# Patient Record
Sex: Female | Born: 1965 | Race: White | Hispanic: No | Marital: Single | Smoking: Never smoker
Health system: Southern US, Community
[De-identification: ages and names within clinical notes are randomized; demographics above are authoritative.]

## PROBLEM LIST (undated history)

## (undated) DIAGNOSIS — Z972 Presence of dental prosthetic device (complete) (partial): Secondary | ICD-10-CM

## (undated) DIAGNOSIS — R112 Nausea with vomiting, unspecified: Secondary | ICD-10-CM

## (undated) DIAGNOSIS — I447 Left bundle-branch block, unspecified: Secondary | ICD-10-CM

## (undated) DIAGNOSIS — K222 Esophageal obstruction: Secondary | ICD-10-CM

## (undated) DIAGNOSIS — Z973 Presence of spectacles and contact lenses: Secondary | ICD-10-CM

## (undated) DIAGNOSIS — Z8719 Personal history of other diseases of the digestive system: Secondary | ICD-10-CM

## (undated) DIAGNOSIS — K219 Gastro-esophageal reflux disease without esophagitis: Secondary | ICD-10-CM

## (undated) DIAGNOSIS — Z974 Presence of external hearing-aid: Secondary | ICD-10-CM

## (undated) DIAGNOSIS — I10 Essential (primary) hypertension: Secondary | ICD-10-CM

## (undated) DIAGNOSIS — T7840XA Allergy, unspecified, initial encounter: Secondary | ICD-10-CM

## (undated) DIAGNOSIS — Z9889 Other specified postprocedural states: Secondary | ICD-10-CM

## (undated) DIAGNOSIS — G4700 Insomnia, unspecified: Secondary | ICD-10-CM

## (undated) DIAGNOSIS — G500 Trigeminal neuralgia: Secondary | ICD-10-CM

## (undated) HISTORY — DX: Allergy, unspecified, initial encounter: T78.40XA

## (undated) HISTORY — DX: Trigeminal neuralgia: G50.0

## (undated) HISTORY — PX: CARDIAC SURGERY: SHX584

## (undated) HISTORY — DX: Insomnia, unspecified: G47.00

## (undated) HISTORY — DX: Esophageal obstruction: K22.2

## (undated) HISTORY — DX: Essential (primary) hypertension: I10

---

## 1998-07-19 HISTORY — PX: ABDOMINAL HYSTERECTOMY: SHX81

## 2004-07-22 ENCOUNTER — Other Ambulatory Visit: Admission: RE | Admit: 2004-07-22 | Discharge: 2004-07-22 | Payer: Self-pay | Admitting: Obstetrics and Gynecology

## 2004-08-20 ENCOUNTER — Observation Stay (HOSPITAL_COMMUNITY): Admission: RE | Admit: 2004-08-20 | Discharge: 2004-08-21 | Payer: Self-pay | Admitting: Obstetrics and Gynecology

## 2004-08-20 ENCOUNTER — Encounter (INDEPENDENT_AMBULATORY_CARE_PROVIDER_SITE_OTHER): Payer: Self-pay | Admitting: Specialist

## 2005-01-12 ENCOUNTER — Emergency Department: Payer: Self-pay | Admitting: Emergency Medicine

## 2005-12-25 ENCOUNTER — Emergency Department: Payer: Self-pay | Admitting: Emergency Medicine

## 2006-01-20 ENCOUNTER — Observation Stay: Payer: Self-pay | Admitting: General Surgery

## 2006-07-19 HISTORY — PX: CHOLECYSTECTOMY: SHX55

## 2006-09-15 ENCOUNTER — Emergency Department: Payer: Self-pay | Admitting: Emergency Medicine

## 2007-02-23 ENCOUNTER — Emergency Department: Payer: Self-pay | Admitting: Unknown Physician Specialty

## 2007-02-23 ENCOUNTER — Other Ambulatory Visit: Payer: Self-pay

## 2007-11-20 ENCOUNTER — Emergency Department (HOSPITAL_COMMUNITY): Admission: EM | Admit: 2007-11-20 | Discharge: 2007-11-20 | Payer: Self-pay | Admitting: Emergency Medicine

## 2008-05-01 ENCOUNTER — Ambulatory Visit: Payer: Self-pay | Admitting: Gastroenterology

## 2008-05-16 ENCOUNTER — Emergency Department: Payer: Self-pay | Admitting: Unknown Physician Specialty

## 2008-10-04 ENCOUNTER — Emergency Department: Payer: Self-pay | Admitting: Emergency Medicine

## 2008-12-11 ENCOUNTER — Ambulatory Visit: Payer: Self-pay | Admitting: Gastroenterology

## 2008-12-13 ENCOUNTER — Ambulatory Visit: Payer: Self-pay | Admitting: Family

## 2009-02-13 ENCOUNTER — Observation Stay: Payer: Self-pay | Admitting: Family

## 2009-06-13 ENCOUNTER — Emergency Department: Payer: Self-pay | Admitting: Emergency Medicine

## 2009-08-03 ENCOUNTER — Ambulatory Visit: Payer: Self-pay | Admitting: Specialist

## 2009-08-11 ENCOUNTER — Emergency Department (HOSPITAL_COMMUNITY): Admission: EM | Admit: 2009-08-11 | Discharge: 2009-08-11 | Payer: Self-pay | Admitting: Emergency Medicine

## 2010-12-04 NOTE — Discharge Summary (Signed)
Mary Bryant, Mary Bryant           ACCOUNT NO.:  [id number]   MEDICAL RECORD NO.:  [id number]          PATIENT TYPE:  INP   LOCATION:  9313                          FACILITY:  [facility]   PHYSICIAN:  Zenaida Niece, M.D.DATE OF BIRTH:  September 24, 1965   DATE OF ADMISSION:  08/20/2004  DATE OF DISCHARGE:                                 DISCHARGE SUMMARY   ADMISSION DIAGNOSIS:  Menorrhagia with anemia.   DISCHARGE DIAGNOSIS:  Menorrhagia with anemia.   PROCEDURES:  On August 20, 2004 she had a vaginal hysterectomy.   HISTORY AND PHYSICAL:  Please see chart for full history and physical.  Briefly, this is a 45 year old gravida 3 para 2-0-1-2 with heavy periods,  using 24 pads in 2 days.  All options were discussed with the patient and  she had a normal pelvic ultrasound.  The patient wished to proceed with  definitive surgical therapy.  Past history significant for two vaginal  deliveries and one spontaneous abortion.  Physical exam significant for  benign abdomen and on pelvic exam, the uterus was upper limits of normal  size, mid planar to retroverted, and nontender, and she had no adnexal  masses.   HOSPITAL COURSE:  The patient was admitted and had a vaginal hysterectomy  under general anesthesia without complications.  Estimated blood loss was  100 mL.  Postoperatively, she had no complications except for postoperative  nausea and vomiting which resolved with IV Zofran.  On the morning of  postoperative day #1 she felt well and was felt to be stable enough for  discharge home.  Preoperative hemoglobin 9.5, postoperative is 8.5.   DISCHARGE INSTRUCTIONS:  Regular diet, pelvic rest, no strenuous activity.  Follow-up is in approximately 6 weeks.  Medications are over-the-counter  ibuprofen or naproxen as needed.      TDM/MEDQ  D:  08/21/2004  T:  08/21/2004  Job:  [id number]

## 2010-12-04 NOTE — Op Note (Signed)
Mary Bryant, Mary Bryant           ACCOUNT NO.:  [id number]   MEDICAL RECORD NO.:  [id number]          PATIENT TYPE:  INP   LOCATION:  NA                            [facility]  [facility]   PHYSICIAN:  Zenaida Niece, M.D.DATE OF BIRTH:  [birth year]   DATE OF PROCEDURE:  08/20/2004  DATE OF DISCHARGE:                                 OPERATIVE REPORT   PREOPERATIVE DIAGNOSIS:  Menorrhagia with anemia.   POSTOPERATIVE DIAGNOSIS:  Menorrhagia with anemia.   PROCEDURE:  Transvaginal hysterectomy and cystoscopy.   SURGEON:  Zenaida Niece, M.D.   ASSISTANT:  Huel Cote, M.D.   ANESTHESIA:  General with an LMA.   SPECIMENS:  Uterus.   ESTIMATED BLOOD LOSS:  100 cc.   FINDINGS:  Small uterus with normal tubes and ovaries and a normal bladder  and ureters by cystoscopy.   DESCRIPTION OF PROCEDURE:  The patient was taken to the operating room and  placed in the dorsal supine position.  General anesthesia was induced, and  she was placed in mobile stirrups.  The perineum and vagina were then  prepped and draped in the usual sterile fashion and the bladder drained with  a red rubber catheter.   A weighted speculum was inserted into the vagina, and the cervix was grasped  with Christella Hartigan tenaculums.  Pitressin solution was used under the  cervicovaginal mucosa.  This was then incised circumferentially with  electrocautery.  Dissection was carried out further sharply.  The bladder  was mobilized off of the anterior portion of the cervix, and the peritoneum  was identified and entered sharply.  A Deaver retractor was used to retract  the bladder anteriorly.  The posterior cul-de-sac was identified and entered  sharply, and a Bonano speculum was placed into the posterior cul-de-sac.  The uterosacral ligament were clamped, transected, and ligated on each side  and tied for later use.  The uterine arteries, cardinal ligaments, and lower  broad ligaments were clamped, transected, and  ligated on each side with #1  chromic.  The fundus was then delivered through the incision, and the utero-  ovarian pedicles were clamped on each side, transected, and doubly ligated  with #1 chromic and tagged for inspection.  Both tubes and ovaries were  found to be normal.  Bleeding from between pedicles on the right side was  controlled with two figure-of-eight sutures of #1 chromic.  The remainder of  the pedicles were hemostatic.  The uterosacral ligaments were then plicated  in the midline with 0 silk suture.  The previously tagged uterosacral  pedicles were also tied in the midline.  The vagina was then closed in a  vertical fashion with running locking 2-0 Vicryl, with adequate closure and  adequate hemostasis.   Attention was turned to cystoscopy.  The patient was given indigo carmine  IV.  The 70-degree cystoscope was inserted and the bladder filled with  approximately 200 cc.  The bladder was intact, and indigo carmine was seen  to come from each ureteral orifice.  The cystoscope was removed, and a Foley  catheter was placed.   The patient was  taken down from the stirrups.  She was taken to the recovery  room in stable condition after tolerating the procedure well.  Counts were  correct x2.  She received Ancef 1 g prior to the procedure.  She had PAS  hose on throughout the procedure.      TDM/MEDQ  D:  08/20/2004  T:  08/20/2004  Job:  [id number]

## 2010-12-04 NOTE — H&P (Signed)
NAMEJUDITHANN, Mary Bryant           ACCOUNT NO.:  [id number]   MEDICAL RECORD NO.:  [id number]          PATIENT TYPE:  INP   LOCATION:  NA                            [facility]   PHYSICIAN:  Zenaida Niece, M.D.DATE OF BIRTH:  19-Dec-1965   DATE OF ADMISSION:  08/20/2004  DATE OF DISCHARGE:                                HISTORY & PHYSICAL   CHIEF COMPLAINT:  Menorrhagia with anemia.   HISTORY OF PRESENT ILLNESS:  This is a 45 year old, gravida 3, para 2-0-1-2,  whom I saw for the first time on January 4 of this year.  At that time, she  came for an annual exam.  She complained of heavy periods where she can use  24 pads in 2 days.  Her menses are fairly regular, and she does have breast  soreness prior to her menses.  She does have occasional discomfort with  intercourse.   Physical examination was significant for a uterus that is upper limits of  normal size.  She had no masses.   Pelvic ultrasound revealed a normal uterus with a small left ovarian cyst.  The patient wishes to proceed with definitive surgical therapy and is  admitted for a hysterectomy at this time.   PAST OB HISTORY:  Vaginal deliveries at 32 and 36 weeks and one intrauterine  demise with spontaneous abortion at approximately 6 months.   PAST MEDICAL HISTORY:  Anemia.   PAST SURGICAL HISTORY:  D&C x 2 and a possible laparoscopy.   ALLERGIES:  PHENERGAN.   CURRENT MEDICATIONS:  Iron.   SOCIAL HISTORY:  The patient is married and denies significant alcohol,  tobacco, or drug use.   GYN HISTORY:  History of an abnormal Pap smear six years ago with normal  followup.  Her husband has a vasectomy which is what they use for birth  control.  She has no unusual lesions or discharge.   REVIEW OF SYSTEMS:  She does have some urinary frequency with normal bowel  movements.   FAMILY HISTORY:  No GYN or colon cancer.   PHYSICAL EXAMINATION:  VITAL SIGNS:  Weight is 153 pounds.  Blood pressure  130/90,  pulse 80.  GENERAL:  Well-developed female in no acute distress.  NECK:  Supple without lymphadenopathy or thyromegaly.  LUNGS:  Clear to auscultation.  HEART:  Regular rate and rhythm without murmur.  ABDOMEN:  Soft, nontender, nondistended, without palpable masses, and she  does have laparoscopic scars.  EXTREMITIES:  No edema and nontender.  PELVIC:  External genitalia normal.  On speculum exam, the cervix is normal.  Pap smear was performed and has returned normal.  On bimanual exam, uterus  is upper limits of normal size, mid planar to retroverted and nontender, and  there are no adnexal masses.   LABORATORY DATA:  Office hemoglobin was 8.7.   ASSESSMENT:  1.  Menorrhagia with anemia.  All surgical and nonsurgical options have been      discussed wit the patient, and she wishes to proceed with definitive      surgical therapy.  Risks of surgery including bleeding, infection, and  damage to surrounding organs have been discussed, and she understands.  2.  The patient complains of her hair falling out.   PLAN:  1.  Admit the patient for vaginal hysterectomy.  We will leave her ovaries      unless they appear abnormal.  2.  I will also check a TSH with her postop labs.      TDM/MEDQ  D:  08/19/2004  T:  08/19/2004  Job:  [id number]

## 2011-03-15 ENCOUNTER — Telehealth: Payer: Self-pay | Admitting: Internal Medicine

## 2011-03-15 NOTE — Telephone Encounter (Signed)
Left message for mom to return my call

## 2011-03-15 NOTE — Telephone Encounter (Signed)
Mom made appointment for cpx in Sept 27 for Mary Bryant.  Then mom was saying pt almost passed out last week her sugar was 60 pt lost 50 pounds since jan  Should patient come in early  When do you want her to come in send to [place] [facility]

## 2011-03-18 NOTE — Telephone Encounter (Signed)
Patient will be keeping Sept appt.

## 2011-04-15 ENCOUNTER — Encounter: Payer: Self-pay | Admitting: Internal Medicine

## 2011-04-19 ENCOUNTER — Telehealth: Payer: Self-pay | Admitting: Internal Medicine

## 2011-04-19 NOTE — Telephone Encounter (Signed)
Wants to get labs done at [facility]  [address]

## 2011-04-20 NOTE — Telephone Encounter (Signed)
I believe we discussed that patients who have not been seen here yet or had their records abstracted will not be able to get labs until they are seen.  She is one of those cases.  Please explain to her that I do not know what labs to order since I have no information about her available to review.

## 2011-04-20 NOTE — Telephone Encounter (Signed)
Patient is asking for an order for labs to have them done at [facility].

## 2011-04-22 NOTE — Telephone Encounter (Signed)
Left message and explained to patient that we are advising to wait until she is seen for labs because we do not have her chart abstracted and has not been seen in office yet.

## 2011-04-26 ENCOUNTER — Other Ambulatory Visit: Payer: Self-pay

## 2011-04-26 ENCOUNTER — Encounter: Payer: Self-pay | Admitting: Internal Medicine

## 2011-05-17 ENCOUNTER — Ambulatory Visit (INDEPENDENT_AMBULATORY_CARE_PROVIDER_SITE_OTHER): Payer: BC Managed Care – PPO | Admitting: Internal Medicine

## 2011-05-17 ENCOUNTER — Encounter: Payer: Self-pay | Admitting: Internal Medicine

## 2011-05-17 DIAGNOSIS — R634 Abnormal weight loss: Secondary | ICD-10-CM

## 2011-05-17 DIAGNOSIS — G4700 Insomnia, unspecified: Secondary | ICD-10-CM | POA: Insufficient documentation

## 2011-05-17 DIAGNOSIS — K222 Esophageal obstruction: Secondary | ICD-10-CM | POA: Insufficient documentation

## 2011-05-17 DIAGNOSIS — Z13220 Encounter for screening for lipoid disorders: Secondary | ICD-10-CM

## 2011-05-17 DIAGNOSIS — E162 Hypoglycemia, unspecified: Secondary | ICD-10-CM

## 2011-05-17 DIAGNOSIS — Z1239 Encounter for other screening for malignant neoplasm of breast: Secondary | ICD-10-CM

## 2011-05-17 DIAGNOSIS — I10 Essential (primary) hypertension: Secondary | ICD-10-CM | POA: Insufficient documentation

## 2011-05-17 MED ORDER — ZOLPIDEM TARTRATE 10 MG PO TABS: 10.0000 mg | ORAL_TABLET | Freq: Every evening | ORAL | Status: AC | PRN

## 2011-05-17 NOTE — Progress Notes (Signed)
Subjective:    Patient ID: Mary Bryant, female    DOB: [birth year], 45 y.o.   MRN: [id number]  HPI  45 yo white female with history of chronic insomnia presents with an unintentional wt loss of 60 lbs since January.  Has been checking blood sugars occasionally due to developmeent of episodic tremors, nu=ausea and sweats in the setting od skipping meals,  Eating junk food instgead of healthy meals, and frequent use of sugared drinks.   She reportedly had cbg of 14  one month ago, which she treated with fruit juice. Reports early satiety so she is more frequently .  Has not labs in the last year  .  Has a headache 5 or 6 days out of every week,  Takes ibuprofen daily. Sleeps only 2 hours per night, for the last 8 yrs.  Has IBS symptoms for the past  Yrs no prior colonoscopy,  Has diarrhea 30 minutes post meal couple times daily. No fecal incontinence.    Past Medical History  Diagnosis Date  . Hypertension     no prior treatment  . Insomnia     chronic, no prior sleep study  . Esophageal stricture     dilated 2009,  elliott  . rhinitis allergic     No current outpatient prescriptions on file prior to visit.    Review of Systems  Constitutional: Positive for unexpected weight change. Negative for fever and chills.  HENT: Negative for hearing loss, ear pain, nosebleeds, congestion, sore throat, facial swelling, rhinorrhea, sneezing, mouth sores, trouble swallowing, neck pain, neck stiffness, voice change, postnasal drip, sinus pressure, tinnitus and ear discharge.   Eyes: Negative for pain, discharge, redness and visual disturbance.  Respiratory: Negative for cough, chest tightness, shortness of breath, wheezing and stridor.   Cardiovascular: Negative for chest pain, palpitations and leg swelling.  Gastrointestinal: Positive for diarrhea.  Musculoskeletal: Negative for myalgias and arthralgias.  Skin: Negative for color change and rash.  Neurological: Negative for dizziness, weakness,  light-headedness and headaches.  Hematological: Negative for adenopathy.  Psychiatric/Behavioral: Positive for sleep disturbance. The patient is nervous/anxious.        Objective:   Physical Exam  Constitutional: She is oriented to person, place, and time. She appears well-developed and well-nourished.  HENT:  Mouth/Throat: Oropharynx is clear and moist.  Eyes: EOM are normal. Pupils are equal, round, and reactive to light. No scleral icterus.  Neck: Normal range of motion. Neck supple. No JVD present. No thyromegaly present.  Cardiovascular: Normal rate, regular rhythm, normal heart sounds and intact distal pulses.   Pulmonary/Chest: Effort normal and breath sounds normal.  Abdominal: Soft. Bowel sounds are normal. She exhibits no mass. There is no tenderness.  Musculoskeletal: Normal range of motion. She exhibits no edema.  Lymphadenopathy:    She has no cervical adenopathy.  Neurological: She is alert and oriented to person, place, and time.  Skin: Skin is warm and dry.  Psychiatric: She has a normal mood and affect.          Assessment & Plan:  Weight loss:  Will screen for diabetes and hyperthyroidism, followed by occult CA if all normal. Hypoglycemia:  If not overt DM ,  May be due to poor dietary habits.  Recommened protein drinks in AM instead of suagrd soft drinks.  Spent 20 minutes cousnelling on diet and lifestyle. Diarrhea:  May be IBS but need to rule out celliac sprue, microscoic colitis given weight loss.  Patient not intnerested in colonscopy at this  time. But will refer if all other workup is negative.

## 2011-05-19 ENCOUNTER — Other Ambulatory Visit (INDEPENDENT_AMBULATORY_CARE_PROVIDER_SITE_OTHER): Payer: BC Managed Care – PPO | Admitting: *Deleted

## 2011-05-19 DIAGNOSIS — R634 Abnormal weight loss: Secondary | ICD-10-CM

## 2011-05-19 DIAGNOSIS — Z13220 Encounter for screening for lipoid disorders: Secondary | ICD-10-CM

## 2011-05-19 DIAGNOSIS — E162 Hypoglycemia, unspecified: Secondary | ICD-10-CM

## 2011-05-19 LAB — COMPREHENSIVE METABOLIC PANEL
Albumin: 4.1 g/dL (ref 3.5–5.2)
BUN: 11 mg/dL (ref 6–23)
CO2: 27 mEq/L (ref 19–32)
GFR: 80.55 mL/min (ref >60.00)
Glucose, Bld: 89 mg/dL (ref 70–99)
Potassium: 4.4 mEq/L (ref 3.5–5.1)
Sodium: 140 mEq/L (ref 135–145)
Total Protein: 7.8 g/dL (ref 6.0–8.3)

## 2011-05-19 LAB — TSH: TSH: 0.73 u[IU]/mL (ref 0.35–5.50)

## 2011-05-19 LAB — HEMOGLOBIN A1C: Hgb A1c MFr Bld: 5.7 % (ref 4.6–6.5)

## 2011-05-19 LAB — LIPID PANEL: Cholesterol: 183 mg/dL (ref 0–200)

## 2011-05-20 ENCOUNTER — Telehealth: Payer: Self-pay | Admitting: Internal Medicine

## 2011-05-20 NOTE — Telephone Encounter (Signed)
[phone]  Checking on lab results from yesterday

## 2011-05-21 NOTE — Telephone Encounter (Signed)
Notified patient of lab results 

## 2011-07-26 ENCOUNTER — Telehealth: Payer: Self-pay | Admitting: Internal Medicine

## 2011-07-26 MED ORDER — ACYCLOVIR 400 MG PO TABS: ORAL_TABLET | ORAL | Status: DC

## 2011-07-26 MED ORDER — ACYCLOVIR 400 MG PO TABS: 400.0000 mg | ORAL_TABLET | ORAL | Status: DC

## 2011-07-26 NOTE — Telephone Encounter (Signed)
Please call her in acyclivir 400 mg one tablet every 4 hours while awake ,  For 5 days  #50 for first time then  .  Refill at 30/day

## 2011-07-26 NOTE — Telephone Encounter (Signed)
Patient notified of rx. Rx sent to pharmacy.

## 2011-07-26 NOTE — Telephone Encounter (Signed)
Patient needing an antibiotic called in for reoccurring fever blisters. She is using abreva but it continues to come back.

## 2011-10-18 ENCOUNTER — Telehealth: Payer: Self-pay | Admitting: Internal Medicine

## 2011-10-18 DIAGNOSIS — F419 Anxiety disorder, unspecified: Secondary | ICD-10-CM

## 2011-10-18 MED ORDER — SERTRALINE HCL 50 MG PO TABS: 50.0000 mg | ORAL_TABLET | Freq: Every day | ORAL | Status: DC

## 2011-10-18 NOTE — Telephone Encounter (Signed)
I have sent an rx for generic [facility]oloft [facility] .  please ask her to make appt to be seen after she starts it,  prior to refill.

## 2011-10-18 NOTE — Telephone Encounter (Signed)
[facility] Triage Call Report Triage Record Num: [id number] Operator: Maryfrances Bunnell Patient Name: Mary Bryant Call Date & Time: 10/17/2011 2:59:53PM Patient Phone: [phone] PCP: Duncan Dull Patient Gender: Female PCP Fax : [phone] Patient DOB: 02-Nov-1965 Practice Name: [facility] Reason for Call: Caller: [name]/Patient; PCP: Duncan Dull; CB#: [phone]; Call regarding: Pt states that she is under a lot of stress and asking for Zoloft script; Patient has previously taken Zoloft in the past and with current situations has decided it may be best to resume. Not eating, crying all the time. Trying to work 2 jobs. Ex- Husband and father both terminally ill. Ex had a brain Anyrsym 10/16/11 and decisions of care placed on her and her 25yr old daughter is trying to go into labor at 32wks. Denies suicidal or harmful thoughts. Maintaining work. Realizes she may need help dealing with things herself at this point. Per Stress Response Protocol "Increasing or worsening physical symptoms thught to be stress related and not previously evaluated." Home care advised and advised to call office in am for appt. Protocol(s) Used: Stress Response Recommended Outcome per Protocol: See Provider within 2 Weeks Reason for Outcome: Increasing or worsening physical symptoms thought to be stress related AND not previously evaluated Care Advice: ~ Call provider if symptoms worsen or new symptoms develop. ~ Eat a balanced diet and follow a regular sleep schedule with adequate sleep, about 7 to 8 hours a night. ~ COPING / BEHAVIOR MANAGEMENT 10/17/2011 3:13:47PM Page 1 of 1 [email or web address]

## 2011-10-18 NOTE — Telephone Encounter (Signed)
Patient notified of Rx and appt.

## 2011-12-27 ENCOUNTER — Emergency Department: Payer: Self-pay | Admitting: Emergency Medicine

## 2012-01-17 ENCOUNTER — Encounter: Payer: Self-pay | Admitting: Internal Medicine

## 2012-01-17 ENCOUNTER — Ambulatory Visit (INDEPENDENT_AMBULATORY_CARE_PROVIDER_SITE_OTHER): Payer: Commercial Managed Care - PPO | Admitting: Internal Medicine

## 2012-01-17 ENCOUNTER — Telehealth: Payer: Self-pay | Admitting: Internal Medicine

## 2012-01-17 VITALS — BP 138/84 | HR 87 | Temp 98.0°F | Resp 16 | Ht 68.0 in | Wt 174.0 lb

## 2012-01-17 DIAGNOSIS — G4700 Insomnia, unspecified: Secondary | ICD-10-CM

## 2012-01-17 DIAGNOSIS — I10 Essential (primary) hypertension: Secondary | ICD-10-CM

## 2012-01-17 DIAGNOSIS — Z6825 Body mass index (BMI) 25.0-25.9, adult: Secondary | ICD-10-CM

## 2012-01-17 DIAGNOSIS — E663 Overweight: Secondary | ICD-10-CM | POA: Insufficient documentation

## 2012-01-17 DIAGNOSIS — K222 Esophageal obstruction: Secondary | ICD-10-CM

## 2012-01-17 MED ORDER — HYOSCYAMINE SULFATE 0.125 MG SL SUBL: 0.1250 mg | SUBLINGUAL_TABLET | SUBLINGUAL | Status: DC | PRN

## 2012-01-17 MED ORDER — TRIAMTERENE-HCTZ 50-25 MG PO CAPS: 1.0000 | ORAL_CAPSULE | ORAL | Status: DC

## 2012-01-17 MED ORDER — ALPRAZOLAM 0.25 MG PO TABS: 0.2500 mg | ORAL_TABLET | Freq: Two times a day (BID) | ORAL | Status: AC | PRN

## 2012-01-17 NOTE — Patient Instructions (Signed)
Consider the Low Glycemic Index Diet and 6 smaller meals daily .  This boosts your metabolism and regulates your sugars:   7 AM Low carbohydrate Protein  Shakes (EAS Carb Control  Or Atkins ,  Available everywhere,   In  cases at [employer]Js )  2.5 carbs  (Add or substitute a toasted sandwhich thin w/ peanut butter)  10 AM: Protein bar by Atkins (snack size,  Chocolate lover's variety at  [employer])    Lunch: sandwich on pita bread or flatbread (Joseph's makes a pita bread and a flat bread , available at [employer] and [employer]; Toufayan makes a low carb flatbread available at [employer] and [employer] that is 100 cal and 9 carbs ) Mission makes a low carb whole wheat tortilla available at [employer] most grocery stores that is 6 net carbs and 210 cal   3 PM:  Mid day :  Another protein bar,  Or a  cheese stick, 1/4 cup of almonds, walnuts, pistachios, pecans, peanuts,  Macadamia nuts  6 PM  Dinner:  "mean and green:"  Meat/chicken/fish, salad, and green veggie : use ranch, vinagrette,  Blue cheese, etc  9 PM snack : Breyer's low carb fudgsicle or  ice cream bar (Carb Smart), or  Weight Watcher's ice cream bar , or another protein shake  Also use dannon light n fit greek yogurt    8 0 cal and 8 carbs

## 2012-01-17 NOTE — Telephone Encounter (Signed)
Patient notified. She is coming in at 4:15.

## 2012-01-17 NOTE — Assessment & Plan Note (Signed)
secondary to financial and family stressors,  Trial of alprazolam to assess improvement in stress level and blood pressure

## 2012-01-17 NOTE — Telephone Encounter (Signed)
Please give her the 4:15 slot today

## 2012-01-17 NOTE — Assessment & Plan Note (Signed)
New onset.  Trial of maxzide.

## 2012-01-17 NOTE — Progress Notes (Signed)
Patient ID: Mary Bryant, female   DOB: 1966-03-26, 46 y.o.   MRN: [id number] Patient Active Problem List  Diagnosis  . Hypertension  . Insomnia  . Esophageal stricture  . Overweight (BMI 25.0-29.9)    Subjective:  CC:   Chief Complaint  Patient presents with  . Hypertension    HPI:   Mary Bryant a 46 y.o. female who presents with a 1 week history of headaches and elevated bp  As high as 153 /106 , doesn't feel emotionally stressed or anxious but works 16 hours a day at 2 full time jobs.  Has been through divorce, ex husband dropping dead,  dtr having a baby,  Losing her apt while closing on a house. Had an episode of choking on her food 1 month ago at [employer],  Treated in ER with NTG due to uncontrolled htn. Since then she has had dysphagia with meats and sandwiches, but not with liquids,.  She has a history of esophgeal strciture requiring prior esophageal dilation twice,  last time 3 years ago  Past Medical History  Diagnosis Date  . Hypertension     no prior treatment  . Insomnia     chronic, no prior sleep study  . Esophageal stricture     dilated 2009,  elliott  . rhinitis allergic     Past Surgical History  Procedure Date  . Cholecystectomy 2008  . Abdominal hysterectomy     no history of ca    The following portions of the patient's history were reviewed and updated as appropriate: Allergies, current medications, and problem list.  Review of Systems:   12 Pt  review of systems was negative except those addressed in the HPI,     History   Social History  . Marital Status: Married    Spouse Name: Amori Cooperman    Number of Children: 2  . Years of Education: 14   Occupational History  .     Social History Main Topics  . Smoking status: Never Smoker   . Smokeless tobacco: Never Used  . Alcohol Use: No  . Drug Use: No  . Sexually Active: Not on file   Other Topics Concern  . 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   Has he  r CNA but doesnot use it.     Objective:  BP 138/84  Pulse 87  Temp 98 F (36.7 C) (Oral)  Resp 16  Ht 5\' 8"  (1.727 m)  Wt 174 lb (78.926 kg)  BMI 26.46 kg/m2  SpO2 97%  General appearance: alert, cooperative and appears stated age Ears: normal TM's and external ear canals both ears Throat: lips, mucosa, and tongue normal; teeth and gums normal Neck: no adenopathy, no carotid bruit, supple, symmetrical, trachea midline and thyroid not enlarged, symmetric, no tenderness/mass/nodules Back: symmetric, no curvature. ROM normal. No CVA tenderness. Lungs: clear to auscultation bilaterally Heart: regular rate and rhythm, S1, S2 normal, no murmur, click, rub or gallop Abdomen: soft, non-tender; bowel sounds normal; no masses,  no organomegaly Pulses: 2+ and symmetric Skin: Skin color, texture, turgor normal. No rashes or lesions Lymph nodes: Cervical, supraclavicular, and axillary nodes normal.  Assessment and Plan:  Hypertension New onset.  Trial of maxzide.  Esophageal stricture Trial of Levsin,  If no improvement with  antispasmodic, will need a barium swallow and GI evaluation .  Insomnia secondary to financial and family stressors,  Trial of alprazolam to assess improvement in stress level and blood pressure  Overweight (BMI 25.0-29.9) I have addressed  BMI and recommended a low glycemic index diet utilizing smaller more frequent meals to increase metabolism.  I have also recommended that patient start exercising with a goal of 30 minutes of aerobic exercise a minimum of 5 days per week. Screening for lipid disorders, thyroid and diabetes to be done today.     Updated Medication List Outpatient Encounter Prescriptions as of 01/17/2012  Medication Sig Dispense Refill  . ALPRAZolam (XANAX) 0.25 MG tablet Take 1 tablet (0.25 mg total) by mouth 2 (two) times  daily as needed for sleep or anxiety.  30 tablet  1  . hyoscyamine (LEVSIN SL) 0.125 MG SL tablet Place 1 tablet (0.125 mg total) under the tongue every 4 (four) hours as needed for cramping.  60 tablet  3  . triamterene-hydrochlorothiazide (DYAZIDE) 50-25 MG per capsule Take 1 capsule by mouth every morning.  30 capsule  3  . DISCONTD: acyclovir (ZOVIRAX) 400 MG tablet Take daily for suppression  30 tablet  11  . DISCONTD: sertraline (ZOLOFT) 50 MG tablet Take 1 tablet (50 mg total) by mouth daily.  30 tablet  1     No orders of the defined types were placed in this encounter.    No Follow-up on file.

## 2012-01-17 NOTE — Assessment & Plan Note (Signed)
Trial of Levsin,  If no improvement with antispasmodic, will need a barium swallow and GI evaluation .

## 2012-01-17 NOTE — Assessment & Plan Note (Signed)
I have addressed  BMI and recommended a low glycemic index diet utilizing smaller more frequent meals to increase metabolism.  I have also recommended that patient start exercising with a goal of 30 minutes of aerobic exercise a minimum of 5 days per week. Screening for lipid disorders, thyroid and diabetes to be done today.   

## 2012-01-17 NOTE — Telephone Encounter (Signed)
Caller: Shastina/Patient; PCP: Duncan Dull; CB#: [phone], Ext [phone]; Call regarding High BP 155 /102 s, onset 1 week;  Pt complains of HA.  All emergent sxs r/o per Hyptertension Protocol.  PLEASE CALL PT BACK TO SCHEDULE AN APPT ON 7-1 if possible.

## 2012-01-18 ENCOUNTER — Telehealth: Payer: Self-pay | Admitting: *Deleted

## 2012-01-18 MED ORDER — TRIAMTERENE-HCTZ 37.5-25 MG PO CAPS: 1.0000 | ORAL_CAPSULE | ORAL | Status: DC

## 2012-01-18 NOTE — Telephone Encounter (Signed)
Pharmacy sent fax stating that the triamterene-hctz 50-25 is no longer made in that strength and they will need a substitute.

## 2012-01-18 NOTE — Telephone Encounter (Signed)
Alternatestrength of maxzide  sent via epic

## 2012-02-17 ENCOUNTER — Telehealth: Payer: Self-pay | Admitting: Internal Medicine

## 2012-02-17 DIAGNOSIS — R1314 Dysphagia, pharyngoesophageal phase: Secondary | ICD-10-CM

## 2012-02-17 NOTE — Telephone Encounter (Signed)
Patient is taking hyoscyamine 0.125 mg it's not working .Patinet states she is still choking.

## 2012-02-18 NOTE — Telephone Encounter (Signed)
She will need to have a urine pregnancy test prior to having the Upper GI small bowel follow through tha t I am ordering. Please have her come by the office on Monday or Tuesday to submit a urine sample.

## 2012-02-18 NOTE — Telephone Encounter (Signed)
Patient says that the hyoscyamine is not working. Please advise.

## 2012-02-18 NOTE — Telephone Encounter (Signed)
Left message on cell phone voicemail for patient to return call on Monday.

## 2012-03-13 ENCOUNTER — Telehealth: Payer: Self-pay | Admitting: Internal Medicine

## 2012-03-13 DIAGNOSIS — R1310 Dysphagia, unspecified: Secondary | ICD-10-CM

## 2012-03-13 NOTE — Telephone Encounter (Signed)
Pt mom came in checking on referral for esophics  checked.  Mom stated pt has been calling for 2 weeks to try to get this appointment.   If you cannot get a hold of ms Davern please call mom Morton Stall [phone]

## 2012-03-13 NOTE — Telephone Encounter (Signed)
I spoke with patient and advised her that she was supposed to be calling us back to come in for a urine pregnancy test before the Upper GI was ordered.  She stated she had a partial hysterectomy and does not have a uterus so she didn't need a urine pregnancy test.

## 2012-03-13 NOTE — Telephone Encounter (Signed)
Mary Bryant/Mary Bryant please order the modified barium swallow study.

## 2012-03-14 ENCOUNTER — Telehealth: Payer: Self-pay | Admitting: Internal Medicine

## 2012-03-14 NOTE — Telephone Encounter (Signed)
I have called the patient's home and her cell also her mother who she allows to be privy to her PHI and I could not get in contact with the patient.

## 2012-03-15 ENCOUNTER — Telehealth: Payer: Self-pay | Admitting: Internal Medicine

## 2012-03-15 ENCOUNTER — Other Ambulatory Visit (HOSPITAL_COMMUNITY): Payer: Self-pay | Admitting: Internal Medicine

## 2012-03-15 DIAGNOSIS — R1310 Dysphagia, unspecified: Secondary | ICD-10-CM

## 2012-03-15 NOTE — Telephone Encounter (Signed)
Patient notified of her appointment with [facility] for her modified Barium Swallow on 9.11.13 @ 11:00.  ° ° °

## 2012-03-22 ENCOUNTER — Ambulatory Visit (HOSPITAL_COMMUNITY): Payer: Commercial Managed Care - PPO

## 2012-03-22 ENCOUNTER — Other Ambulatory Visit (HOSPITAL_COMMUNITY): Payer: Commercial Managed Care - PPO

## 2012-03-23 NOTE — Telephone Encounter (Signed)
Patient notified of her appointment with [facility] for her modified Barium Swallow on 9.11.13 @ 11:00.

## 2012-03-29 ENCOUNTER — Ambulatory Visit (HOSPITAL_COMMUNITY)
Admission: RE | Admit: 2012-03-29 | Discharge: 2012-03-29 | Disposition: A | Discharge status: Home / Self Care | Payer: Self-pay | Source: Ambulatory Visit | Attending: Internal Medicine | Admitting: Internal Medicine

## 2012-03-29 ENCOUNTER — Other Ambulatory Visit (HOSPITAL_COMMUNITY): Payer: Self-pay | Admitting: Internal Medicine

## 2012-03-29 ENCOUNTER — Ambulatory Visit (HOSPITAL_COMMUNITY): Admission: RE | Admit: 2012-03-29 | Payer: Commercial Managed Care - PPO | Source: Ambulatory Visit

## 2012-03-29 DIAGNOSIS — R1310 Dysphagia, unspecified: Secondary | ICD-10-CM

## 2012-03-29 DIAGNOSIS — R6889 Other general symptoms and signs: Secondary | ICD-10-CM | POA: Insufficient documentation

## 2012-03-31 ENCOUNTER — Telehealth: Payer: Self-pay | Admitting: Internal Medicine

## 2012-03-31 NOTE — Telephone Encounter (Signed)
Patient called stating someone left her a message. She is thinking it is from her radiology testing.

## 2012-04-04 ENCOUNTER — Telehealth: Payer: Self-pay | Admitting: Internal Medicine

## 2012-04-04 NOTE — Telephone Encounter (Signed)
Patient notified

## 2012-04-04 NOTE — Telephone Encounter (Signed)
Have pt paged at work Pt checking on test results from last week

## 2012-04-06 ENCOUNTER — Other Ambulatory Visit: Payer: Self-pay | Admitting: Internal Medicine

## 2012-04-06 DIAGNOSIS — R1310 Dysphagia, unspecified: Secondary | ICD-10-CM

## 2012-05-08 ENCOUNTER — Encounter: Payer: Self-pay | Admitting: Internal Medicine

## 2012-05-08 ENCOUNTER — Ambulatory Visit (INDEPENDENT_AMBULATORY_CARE_PROVIDER_SITE_OTHER): Payer: Commercial Managed Care - PPO | Admitting: Internal Medicine

## 2012-05-08 VITALS — BP 150/92 | HR 85 | Temp 97.9°F | Ht 67.5 in | Wt 166.2 lb

## 2012-05-08 DIAGNOSIS — M791 Myalgia, unspecified site: Secondary | ICD-10-CM

## 2012-05-08 DIAGNOSIS — I10 Essential (primary) hypertension: Secondary | ICD-10-CM

## 2012-05-08 DIAGNOSIS — G4700 Insomnia, unspecified: Secondary | ICD-10-CM

## 2012-05-08 DIAGNOSIS — Z13220 Encounter for screening for lipoid disorders: Secondary | ICD-10-CM

## 2012-05-08 DIAGNOSIS — K222 Esophageal obstruction: Secondary | ICD-10-CM

## 2012-05-08 DIAGNOSIS — IMO0001 Reserved for inherently not codable concepts without codable children: Secondary | ICD-10-CM

## 2012-05-08 LAB — LIPID PANEL
HDL: 76 mg/dL (ref >39.00)
LDL Cholesterol: 102 mg/dL — ABNORMAL HIGH (ref 0–99)
Total CHOL/HDL Ratio: 3
VLDL: 16.4 mg/dL (ref 0.0–40.0)

## 2012-05-08 LAB — COMPREHENSIVE METABOLIC PANEL
ALT: 17 U/L (ref 0–35)
AST: 25 U/L (ref 0–37)
Alkaline Phosphatase: 74 U/L (ref 39–117)
Potassium: 4.4 mEq/L (ref 3.5–5.1)
Sodium: 134 mEq/L — ABNORMAL LOW (ref 135–145)
Total Bilirubin: 0.7 mg/dL (ref 0.3–1.2)
Total Protein: 8.5 g/dL — ABNORMAL HIGH (ref 6.0–8.3)

## 2012-05-08 LAB — MICROALBUMIN / CREATININE URINE RATIO
Creatinine,U: 105.4 mg/dL
Microalb Creat Ratio: 1.3 mg/g (ref 0.0–30.0)
Microalb, Ur: 1.4 mg/dL (ref 0.0–1.9)

## 2012-05-08 LAB — MAGNESIUM: Magnesium: 1.9 mg/dL (ref 1.5–2.5)

## 2012-05-08 MED ORDER — AMLODIPINE BESYLATE 5 MG PO TABS: 5.0000 mg | ORAL_TABLET | Freq: Every day | ORAL | Status: DC

## 2012-05-08 NOTE — Assessment & Plan Note (Signed)
Secondary to work stressors of 2 full time jobs  Strongly urged to eliminate one job or reduce to part time.

## 2012-05-08 NOTE — Assessment & Plan Note (Signed)
Secondary to GERD.  For EGD nex week.

## 2012-05-08 NOTE — Progress Notes (Signed)
Patient ID: Mary Bryant, female   DOB: [birth year], 46 y.o.   MRN: [id number] Patient Active Problem List  Diagnosis  . Hypertension  . Insomnia  . Esophageal stricture  . Overweight (BMI 25.0-29.9)    Subjective:  CC:   Chief Complaint  Patient presents with  . Hypertension    HPI:   Mary Bryant a 46 y.o. female who presents Elevated blood pressures.  For the past week blood pressures have been 163/105, 173/112 for the past 3 or 4 days, no use of  OTC meds.  She feels horrible, has a bad headache,  Left sided facial numbness,  Worsening back pain.  Blurred vision.  Denies  chest pain but still has uncontrolled GERD and has endoscopy planned for next week .  Using anxiety medication to help her sleep .  Still working 2 jobs , only  sleeping 4 hours per night .  Also having urinary frequency,  And leg cramps last night .   Past Medical History  Diagnosis Date  . Hypertension     no prior treatment  . Insomnia     chronic, no prior sleep study  . Esophageal stricture     dilated 2009,  elliott  . rhinitis allergic     Past Surgical History  Procedure Date  . Cholecystectomy 2008  . Abdominal hysterectomy     no history of ca     The following portions of the patient's history were reviewed and updated as appropriate: Allergies, current medications, and problem list.  Review of Systems:   12 Pt  review of systems was negative except those addressed in the HPI,     History   Social History  . Marital Status: Married    Spouse Name: Mary Bryant    Number of Children: 2  . Years of Education: 14   Occupational History  .     Social History Main Topics  . Smoking status: Never Smoker   . Smokeless tobacco: Never Used  . Alcohol Use: No  . Drug Use: No  . Sexually Active: Not on file   Other Topics Concern  . 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   Has he  r CNA but doesnot use it.     Objective:  BP 150/92  Pulse 85  Temp 97.9 F (36.6 C) (Oral)  Ht 5' 7.5" (1.715 m)  Wt 166 lb 4 oz (75.411 kg)  BMI 25.65 kg/m2  SpO2 97%  General appearance: alert, cooperative and appears stated age Neck: no adenopathy, no carotid bruit, supple, symmetrical, trachea midline and thyroid not enlarged, symmetric, no tenderness/mass/nodules Back: symmetric, no curvature. ROM normal. No CVA tenderness. Lungs: clear to auscultation bilaterally Heart: regular rate and rhythm, S1, S2 normal, no murmur, click, rub or gallop Abdomen: soft, non-tender; bowel sounds normal; no masses,  no organomegaly Pulses: 2+ and symmetric Skin: Skin color, texture, turgor normal. No rashes or lesions Lymph nodes: Cervical, supraclavicular, and axillary nodes normal. Neuro: grossly nonfocal,  Retinal exam no A/V nicking.  Assessment and Plan:  Hypertension Uncontrolled, aggravated by sleep deprivation and stress. Clonidine 0.1 mg given in house ,  Starting 5 mg amlodipine, adding to triamterene/hct.  Insomnia Secondary to work stressors of 2 full time jobs  Strongly urged to eliminate one  job or reduce to part time.   Esophageal stricture Secondary to GERD.  For EGD nex week.    Updated Medication List Outpatient Encounter Prescriptions as of 05/08/2012  Medication Sig Dispense Refill  . triamterene-hydrochlorothiazide (DYAZIDE) 37.5-25 MG per capsule Take 1 each (1 capsule total) by mouth every morning.  30 capsule  6  . amLODipine (NORVASC) 5 MG tablet Take 1 tablet (5 mg total) by mouth daily.  90 tablet  3  . hyoscyamine (LEVSIN SL) 0.125 MG SL tablet Place 1 tablet (0.125 mg total) under the tongue every 4 (four) hours as needed for cramping.  60 tablet  3  . DISCONTD: amLODipine (NORVASC) 5 MG tablet Take 1 tablet (5 mg total) by mouth daily.  90 tablet  3     Orders Placed This Encounter  Procedures  .  Microalbumin / creatinine urine ratio  . Comprehensive metabolic panel  . Magnesium  . Lipid panel    No Follow-up on file.

## 2012-05-08 NOTE — Patient Instructions (Addendum)
I gave you 0.1 mg oc clonidine in the office  Start the amlodipine tonight , 1/2 tablet (2.5 mg ) if your bp is > 150/80  Start daily amlodipine 5 mg in the morning  Continue triamterene/hctz too  E mail me your bps in one week

## 2012-05-08 NOTE — Assessment & Plan Note (Addendum)
Uncontrolled, aggravated by sleep deprivation and stress. Clonidine 0.1 mg given in house ,  Starting 5 mg amlodipine, adding to triamterene/hct.

## 2012-05-11 ENCOUNTER — Telehealth: Payer: Self-pay | Admitting: Internal Medicine

## 2012-05-11 NOTE — Telephone Encounter (Signed)
Pt called to see if her form for insurance (wellness program) has been done and mailed back to pt She faxed  This last week thur or Friday Please advise

## 2012-05-15 NOTE — Telephone Encounter (Signed)
Rhonda please respond

## 2012-05-16 NOTE — Telephone Encounter (Signed)
Do you have a wellness form for this patient.

## 2012-05-16 NOTE — Telephone Encounter (Signed)
No, I do not

## 2012-05-18 NOTE — Telephone Encounter (Signed)
Left message for patient to call me back regarding wellness form.

## 2012-06-01 NOTE — Telephone Encounter (Signed)
Patient sent wellness form, she stated that it needs to be filled out from 05/08/12 visit. Form is in [employer].

## 2012-09-12 ENCOUNTER — Ambulatory Visit (INDEPENDENT_AMBULATORY_CARE_PROVIDER_SITE_OTHER): Payer: Commercial Managed Care - PPO | Admitting: Internal Medicine

## 2012-09-12 ENCOUNTER — Encounter: Payer: Self-pay | Admitting: Internal Medicine

## 2012-09-12 ENCOUNTER — Emergency Department: Payer: Self-pay | Admitting: Emergency Medicine

## 2012-09-12 ENCOUNTER — Ambulatory Visit: Payer: Commercial Managed Care - PPO | Admitting: Adult Health

## 2012-09-12 VITALS — BP 142/92 | HR 110 | Temp 101.0°F | Resp 18

## 2012-09-12 DIAGNOSIS — J111 Influenza due to unidentified influenza virus with other respiratory manifestations: Secondary | ICD-10-CM

## 2012-09-12 DIAGNOSIS — J101 Influenza due to other identified influenza virus with other respiratory manifestations: Secondary | ICD-10-CM

## 2012-09-12 DIAGNOSIS — R6889 Other general symptoms and signs: Secondary | ICD-10-CM

## 2012-09-12 LAB — COMPREHENSIVE METABOLIC PANEL
Albumin: 4.3 g/dL (ref 3.4–5.0)
Alkaline Phosphatase: 117 U/L (ref 50–136)
Anion Gap: 10 (ref 7–16)
BUN: 16 mg/dL (ref 7–18)
Bilirubin,Total: 0.6 mg/dL (ref 0.2–1.0)
Calcium, Total: 9.8 mg/dL (ref 8.5–10.1)
Chloride: 103 mmol/L (ref 98–107)
Co2: 23 mmol/L (ref 21–32)
Creatinine: 1.07 mg/dL (ref 0.60–1.30)
EGFR (African American): >60
EGFR (Non-African Amer.): >60
Glucose: 103 mg/dL — ABNORMAL HIGH (ref 65–99)
Osmolality: 273 (ref 275–301)
Potassium: 4 mmol/L (ref 3.5–5.1)
SGOT(AST): 18 U/L (ref 15–37)
SGPT (ALT): 27 U/L (ref 12–78)
Sodium: 136 mmol/L (ref 136–145)
Total Protein: 9.1 g/dL — ABNORMAL HIGH (ref 6.4–8.2)

## 2012-09-12 LAB — CBC WITH DIFFERENTIAL/PLATELET
Basophil #: 0.1 10*3/uL (ref 0.0–0.1)
Basophil %: 0.6 %
Eosinophil #: 0.1 10*3/uL (ref 0.0–0.7)
Eosinophil %: 0.8 %
HCT: 48.4 % — ABNORMAL HIGH (ref 35.0–47.0)
HGB: 15.8 g/dL (ref 12.0–16.0)
Lymphocyte #: 0.9 10*3/uL — ABNORMAL LOW (ref 1.0–3.6)
Lymphocyte %: 7.3 %
MCH: 28.9 pg (ref 26.0–34.0)
MCHC: 32.5 g/dL (ref 32.0–36.0)
MCV: 89 fL (ref 80–100)
Monocyte #: 0.7 x10 3/mm (ref 0.2–0.9)
Monocyte %: 6.1 %
Neutrophil #: 10.3 10*3/uL — ABNORMAL HIGH (ref 1.4–6.5)
Neutrophil %: 85.2 %
Platelet: 353 10*3/uL (ref 150–440)
RBC: 5.46 10*6/uL — ABNORMAL HIGH (ref 3.80–5.20)
RDW: 13.3 % (ref 11.5–14.5)
WBC: 12.1 10*3/uL — ABNORMAL HIGH (ref 3.6–11.0)

## 2012-09-12 LAB — POCT INFLUENZA A/B: Influenza A, POC: POSITIVE

## 2012-09-12 MED ORDER — HYDROCODONE-HOMATROPINE 5-1.5 MG/5ML PO SYRP: 5.0000 mL | ORAL_SOLUTION | Freq: Four times a day (QID) | ORAL | Status: DC | PRN

## 2012-09-12 MED ORDER — ONDANSETRON 8 MG PO TBDP: 8.0000 mg | ORAL_TABLET | Freq: Three times a day (TID) | ORAL | Status: DC | PRN

## 2012-09-12 NOTE — Progress Notes (Signed)
Patient ID: Mary Bryant, female   DOB: 1965-11-15, 47 y.o.   MRN: [id number]  Patient Active Problem List  Diagnosis  . Hypertension  . Insomnia  . Esophageal stricture  . Overweight (BMI 25.0-29.9)  . Influenza A    Subjective:  CC:   Chief Complaint  Patient presents with  . Emesis    HPI:    Mary Bryant a 47 y.o. female who presents with nausea and vomiting. Symptoms started 3 days ago accompanied by muscle aches and abdominal pain.  She has no history of recent travel,  Still on PPI.  She had an endoscopy last Fall which showed H pylori but she did not finish the treatments bc they were too strong on her stomach,  So she is just taking omeprazole. However she has not eaten in 3 days .   She has had diarrhea for three days.  She is allergic to phenergan which causes hives.  Her flu test is positive .   Past Medical History  Diagnosis Date  . Hypertension     no prior treatment  . Insomnia     chronic, no prior sleep study  . Esophageal stricture     dilated 2009,  elliott  . rhinitis allergic     Past Surgical History  Procedure Laterality Date  . Cholecystectomy  2008  . Abdominal hysterectomy      no history of ca        The following portions of the patient's history were reviewed and updated as appropriate: Allergies, current medications, and problem list.    Review of Systems:   12 Pt  review of systems was negative except those addressed in the HPI,     History   Social History  . Marital Status: Married    Spouse Name: Mary Bryant    Number of Children: 2  . Years of Education: 14   Occupational History  .     Social History Main Topics  . Smoking status: Never Smoker   . Smokeless tobacco: Never Used  . Alcohol Use: No  . Drug Use: No  . Sexually Active: Not on file   Other Topics Concern  . 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   Has he  r CNA but doesnot use it.     Objective:  BP 142/92  Pulse 110  Temp(Src) 101 F (38.3 C)  Resp 18  SpO2 98%  General appearance: alert, cooperative and appears stated age Ears: normal TM's and external ear canals both ears Throat: lips, mucosa, and tongue normal; teeth and gums normal Neck: no adenopathy, no carotid bruit, supple, symmetrical, trachea midline and thyroid not enlarged, symmetric, no tenderness/mass/nodules Back: symmetric, no curvature. ROM normal. No CVA tenderness. Lungs: clear to auscultation bilaterally Heart: regular rate and rhythm, S1, S2 normal, no murmur, click, rub or gallop Abdomen: soft, non-tender; bowel sounds normal; no masses,  no organomegaly Pulses: 2+ and symmetric Skin: Skin color, texture, turgor normal. No rashes or lesions Lymph nodes: Cervical, supraclavicular, and axillary nodes normal.  Assessment and Plan:  Influenza A She is dehydrated and febrile.  I have recommended that she go to the ER for IV fluids but she wants to try going home and forcing fluids.  Zofran and cough syrup sent to  her local pharmacy.  Advised that if she cannot keep down fluids she will need to go to the ER l   Updated Medication List Outpatient Encounter Prescriptions as of 09/12/2012  Medication Sig Dispense Refill  . amLODipine (NORVASC) 5 MG tablet Take 1 tablet (5 mg total) by mouth daily.  90 tablet  3  . triamterene-hydrochlorothiazide (DYAZIDE) 37.5-25 MG per capsule Take 1 each (1 capsule total) by mouth every morning.  30 capsule  6  . HYDROcodone-homatropine (HYCODAN) 5-1.5 MG/5ML syrup Take 5 mLs by mouth every 6 (six) hours as needed for cough.  180 mL  0  . hyoscyamine (LEVSIN SL) 0.125 MG SL tablet Place 1 tablet (0.125 mg total) under the tongue every 4 (four) hours as needed for cramping.  60 tablet  3  . ondansetron (ZOFRAN-ODT) 8 MG disintegrating tablet Take 1 tablet (8 mg total) by mouth  every 8 (eight) hours as needed for nausea.  30 tablet  0   No facility-administered encounter medications on file as of 09/12/2012.     Orders Placed This Encounter  Procedures  . POCT Influenza A/B    No Follow-up on file.

## 2012-09-12 NOTE — Patient Instructions (Addendum)
You have the flu.  You are dehydrated and will need to be able to take fluids by mouth to maintain normal blood pressure  Try [place] ice and popsicles,  gatorade the first day .  Banana pops the second day   Stop the triamterene/hctz while you are sick but continue the amlodipine for BP > 150  If you cannot, you will need IV fluids which will require going to the ER   I will send prescriptions for zofran (nausea), hycodan (cough syrup with codeine)  Immodium for diarrhea  Tylenol and motrin for muscle aches

## 2012-09-13 ENCOUNTER — Encounter: Payer: Self-pay | Admitting: Internal Medicine

## 2012-09-13 DIAGNOSIS — J101 Influenza due to other identified influenza virus with other respiratory manifestations: Secondary | ICD-10-CM | POA: Insufficient documentation

## 2012-09-13 NOTE — Assessment & Plan Note (Signed)
She is dehydrated and febrile.  I have recommended that she go to the ER for IV fluids but she wants to try going home and forcing fluids.  Zofran and cough syrup sent to her local pharmacy.  Advised that if she cannot keep down fluids she will need to go to the ER l

## 2013-01-25 ENCOUNTER — Other Ambulatory Visit: Payer: Self-pay | Admitting: Internal Medicine

## 2013-02-06 ENCOUNTER — Other Ambulatory Visit: Payer: Self-pay | Admitting: Internal Medicine

## 2013-03-14 ENCOUNTER — Encounter: Payer: Commercial Managed Care - PPO | Admitting: Internal Medicine

## 2013-04-02 ENCOUNTER — Telehealth: Payer: Self-pay | Admitting: *Deleted

## 2013-04-02 ENCOUNTER — Emergency Department: Payer: Self-pay | Admitting: Emergency Medicine

## 2013-04-02 LAB — COMPREHENSIVE METABOLIC PANEL
Albumin: 3.5 g/dL (ref 3.4–5.0)
Alkaline Phosphatase: 93 U/L (ref 50–136)
Anion Gap: 6 — ABNORMAL LOW (ref 7–16)
BUN: 12 mg/dL (ref 7–18)
Bilirubin,Total: 0.3 mg/dL (ref 0.2–1.0)
Calcium, Total: 9.3 mg/dL (ref 8.5–10.1)
Chloride: 108 mmol/L — ABNORMAL HIGH (ref 98–107)
Co2: 26 mmol/L (ref 21–32)
Creatinine: 0.88 mg/dL (ref 0.60–1.30)
EGFR (African American): >60
EGFR (Non-African Amer.): >60
Glucose: 102 mg/dL — ABNORMAL HIGH (ref 65–99)
Osmolality: 279 (ref 275–301)
Potassium: 4 mmol/L (ref 3.5–5.1)
SGOT(AST): 17 U/L (ref 15–37)
SGPT (ALT): 19 U/L (ref 12–78)
Sodium: 140 mmol/L (ref 136–145)
Total Protein: 7.4 g/dL (ref 6.4–8.2)

## 2013-04-02 LAB — TROPONIN I
Troponin-I: <0.02 ng/mL
Troponin-I: <0.02 ng/mL

## 2013-04-02 LAB — CBC
HCT: 39.8 % (ref 35.0–47.0)
HGB: 13.8 g/dL (ref 12.0–16.0)
MCH: 29.8 pg (ref 26.0–34.0)
MCHC: 34.6 g/dL (ref 32.0–36.0)
MCV: 86 fL (ref 80–100)
Platelet: 267 10*3/uL (ref 150–440)
RBC: 4.61 10*6/uL (ref 3.80–5.20)
RDW: 13.3 % (ref 11.5–14.5)
WBC: 9.5 10*3/uL (ref 3.6–11.0)

## 2013-04-02 MED ORDER — OMEPRAZOLE 40 MG PO CPDR: 40.0000 mg | DELAYED_RELEASE_CAPSULE | Freq: Two times a day (BID) | ORAL | Status: DC

## 2013-04-02 NOTE — Addendum Note (Signed)
Addended by: Sherlene Shams on: 04/02/2013 02:31 PM   Modules accepted: Orders

## 2013-04-02 NOTE — Telephone Encounter (Signed)
Pt is currently in the hospital for chest pain-pain is no better. Has appt with Dr. Juliann Pares tomorrow (cardiologist). Mother [name]) states that they are trying to discharge her but she doesn't want to leave because she is still having severe chest pains. Was told at the ER that Dr. Darrick Huntsman would have to admit patient it she feels that she needs to stay. Pt feels that if she leaves now, she will be back this afternoon. Has been at ER since around 8:30 this morning. Please advise

## 2013-04-02 NOTE — Telephone Encounter (Signed)
Referral in process to dr Servando Snare foe ER visit c/w esophagitis

## 2013-04-02 NOTE — Telephone Encounter (Signed)
[name] wants to know if you could refer Mary Bryant to Dr. Servando Snare [facility]) since Dr. Marcell Anger is no longer in practice here. She states that she has been taking the OTC reflux medication daily. She wants Dr. Servando Snare to see her today. She was discharged from [facility] & is currently on her way home.

## 2013-04-02 NOTE — Telephone Encounter (Signed)
Patient has an apt with [facility] on 9/22 @ 345.

## 2013-04-02 NOTE — Telephone Encounter (Signed)
Referral is in process as requested. She will not likely be seen today,  She needs to resume omeprazole .,  i will send it to [facility]   Bid,

## 2013-04-02 NOTE — Telephone Encounter (Signed)
I do not admit patients to the hospital. She has a history of esophagitis and last time she was seen in February was not taking her omeprazole and looks like has not been refilling her PPI, so her symptoms may be due to recurrent esophagitis.

## 2013-04-04 ENCOUNTER — Ambulatory Visit: Payer: Self-pay | Admitting: Internal Medicine

## 2013-04-04 ENCOUNTER — Observation Stay: Payer: Self-pay | Admitting: Student

## 2013-04-04 DIAGNOSIS — R079 Chest pain, unspecified: Secondary | ICD-10-CM | POA: Insufficient documentation

## 2013-04-04 LAB — BASIC METABOLIC PANEL
Anion Gap: 5 — ABNORMAL LOW (ref 7–16)
BUN: 10 mg/dL (ref 7–18)
Calcium, Total: 9.5 mg/dL (ref 8.5–10.1)
Chloride: 108 mmol/L — ABNORMAL HIGH (ref 98–107)
Co2: 27 mmol/L (ref 21–32)
Creatinine: 0.98 mg/dL (ref 0.60–1.30)
EGFR (African American): >60
EGFR (Non-African Amer.): >60
Glucose: 111 mg/dL — ABNORMAL HIGH (ref 65–99)
Osmolality: 279 (ref 275–301)
Potassium: 3.8 mmol/L (ref 3.5–5.1)
Sodium: 140 mmol/L (ref 136–145)

## 2013-04-04 LAB — CK TOTAL AND CKMB (NOT AT ARMC)
CK, Total: 101 U/L (ref 21–215)
CK-MB: <0.5 ng/mL — ABNORMAL LOW (ref 0.5–3.6)

## 2013-04-04 LAB — CBC
HCT: 42.2 % (ref 35.0–47.0)
HGB: 14.4 g/dL (ref 12.0–16.0)
MCH: 29.4 pg (ref 26.0–34.0)
MCHC: 34.1 g/dL (ref 32.0–36.0)
MCV: 86 fL (ref 80–100)
Platelet: 297 10*3/uL (ref 150–440)
RBC: 4.89 10*6/uL (ref 3.80–5.20)
RDW: 13.7 % (ref 11.5–14.5)
WBC: 10.4 10*3/uL (ref 3.6–11.0)

## 2013-04-04 LAB — TROPONIN I: Troponin-I: <0.02 ng/mL

## 2013-04-05 LAB — CK TOTAL AND CKMB (NOT AT ARMC)
CK, Total: 76 U/L (ref 21–215)
CK, Total: 85 U/L (ref 21–215)
CK-MB: <0.5 ng/mL — ABNORMAL LOW (ref 0.5–3.6)
CK-MB: 0.5 ng/mL (ref 0.5–3.6)

## 2013-04-05 LAB — TROPONIN I
Troponin-I: <0.02 ng/mL
Troponin-I: <0.02 ng/mL

## 2013-04-06 ENCOUNTER — Telehealth: Payer: Self-pay | Admitting: Internal Medicine

## 2013-04-06 NOTE — Telephone Encounter (Signed)
Patient is being discharged from the hospital today. Made her a follow up appointment on 04/18/13 and the hospital states they will send records.

## 2013-04-09 NOTE — Telephone Encounter (Signed)
Mary Bryant,  It doesn't matter in this situation bc patient is not Medicare, but I thought we had to document that patient was contacted by office and asked if she was having any issues?

## 2013-04-09 NOTE — Telephone Encounter (Signed)
Faxed hospital for copy of discharge summary.FYI

## 2013-04-12 ENCOUNTER — Ambulatory Visit: Payer: Self-pay | Admitting: Gastroenterology

## 2013-04-18 ENCOUNTER — Ambulatory Visit: Payer: Self-pay | Admitting: Internal Medicine

## 2013-04-18 ENCOUNTER — Ambulatory Visit: Payer: Commercial Managed Care - PPO | Admitting: Internal Medicine

## 2013-06-08 ENCOUNTER — Encounter: Payer: Self-pay | Admitting: Internal Medicine

## 2013-06-08 ENCOUNTER — Encounter (INDEPENDENT_AMBULATORY_CARE_PROVIDER_SITE_OTHER): Payer: Self-pay

## 2013-06-08 ENCOUNTER — Ambulatory Visit (INDEPENDENT_AMBULATORY_CARE_PROVIDER_SITE_OTHER): Payer: Commercial Managed Care - PPO | Admitting: Internal Medicine

## 2013-06-08 VITALS — BP 118/86 | HR 77 | Temp 98.5°F | Resp 12 | Ht 67.0 in | Wt 170.2 lb

## 2013-06-08 DIAGNOSIS — Z1239 Encounter for other screening for malignant neoplasm of breast: Secondary | ICD-10-CM | POA: Insufficient documentation

## 2013-06-08 DIAGNOSIS — G8929 Other chronic pain: Secondary | ICD-10-CM

## 2013-06-08 DIAGNOSIS — E663 Overweight: Secondary | ICD-10-CM

## 2013-06-08 DIAGNOSIS — K222 Esophageal obstruction: Secondary | ICD-10-CM

## 2013-06-08 DIAGNOSIS — F411 Generalized anxiety disorder: Secondary | ICD-10-CM | POA: Insufficient documentation

## 2013-06-08 DIAGNOSIS — E559 Vitamin D deficiency, unspecified: Secondary | ICD-10-CM

## 2013-06-08 DIAGNOSIS — Z8679 Personal history of other diseases of the circulatory system: Secondary | ICD-10-CM | POA: Insufficient documentation

## 2013-06-08 DIAGNOSIS — Z23 Encounter for immunization: Secondary | ICD-10-CM

## 2013-06-08 DIAGNOSIS — I2510 Atherosclerotic heart disease of native coronary artery without angina pectoris: Secondary | ICD-10-CM | POA: Insufficient documentation

## 2013-06-08 LAB — COMPREHENSIVE METABOLIC PANEL
Albumin: 4.2 g/dL (ref 3.5–5.2)
Alkaline Phosphatase: 83 U/L (ref 39–117)
BUN: 10 mg/dL (ref 6–23)
Calcium: 9.7 mg/dL (ref 8.4–10.5)
Creatinine, Ser: 0.9 mg/dL (ref 0.4–1.2)
Glucose, Bld: 91 mg/dL (ref 70–99)
Potassium: 4.4 mEq/L (ref 3.5–5.1)

## 2013-06-08 LAB — LIPID PANEL
Cholesterol: 187 mg/dL (ref 0–200)
HDL: 73.5 mg/dL (ref >39.00)
LDL Cholesterol: 100 mg/dL — ABNORMAL HIGH (ref 0–99)
Total CHOL/HDL Ratio: 3
VLDL: 13.4 mg/dL (ref 0.0–40.0)

## 2013-06-08 LAB — TSH: TSH: 1.06 u[IU]/mL (ref 0.35–5.50)

## 2013-06-08 MED ORDER — PAROXETINE HCL 10 MG PO TABS: 10.0000 mg | ORAL_TABLET | Freq: Every day | ORAL | Status: DC

## 2013-06-08 MED ORDER — PAROXETINE HCL 20 MG PO TABS: 20.0000 mg | ORAL_TABLET | Freq: Every day | ORAL | Status: DC

## 2013-06-08 MED ORDER — ALPRAZOLAM 0.5 MG PO TABS: 0.5000 mg | ORAL_TABLET | Freq: Every evening | ORAL | Status: DC | PRN

## 2013-06-08 NOTE — Patient Instructions (Signed)
Talk to Dr Servando Snare about your continued symptoms of dysphagia. You may need esophageal manometry study  Trial of nexium daily in the mornign 30 minutes prior to food or drink  Try the Atkins Protein Shakes and the EAS carb control protein shakes ([employer] has good variety of flavosrs)  Dannon Light n Fit Greek Yougurt:  avaialable in many flavors at food [place]   Mammogram to be scheduled  Trial of paxil 20 mg daily for anxiety,  Alprazolam as needed for bedtime anxiety/insomnia   You received the flu and TDaP vaccines today    Return in one month

## 2013-06-08 NOTE — Progress Notes (Signed)
Patient ID: Mary Bryant, female   DOB: [birth year], 47 y.o.   MRN: [id number]  Patient Active Problem List   Diagnosis Date Noted  . Generalized anxiety disorder 06/08/2013  . Screening for breast cancer 06/08/2013  . CAD in native artery 06/08/2013  . H/O left bundle branch block 06/08/2013  . Influenza A 09/13/2012  . Overweight (BMI 25.0-29.9) 01/17/2012  . Insomnia   . Esophageal stricture     Subjective:  CC:   Chief Complaint  Patient presents with  . Annual Exam    HPI:   Mary Bryant a 47 y.o. female who presents Patient scheduled for annual physical but has many issues to discuss.  "I feel like I'm dying"  In September was taken to ER with chest pain and severe frontal headache.  CT head was normal but EKG noted a LBBB. She underwent a stress test by [facility], sent home and symptoms returned but worse.  Returned to [facility] , 2nd head  CT scan, was admitted and observed for 5 days on telemetry, sent home and worked up with EGD/colonoscopy by Servando Snare  de to dysphagia and regurgitation,  followed by diagnostic heart catheterization which was negative for significant disease.  She states that Dr. Juliann Pares told her that she will eventually need a pacemaker .  Continues to have daily chest pain  Under the left breast ,  Constant,  At times feels like a knife sticking in chest through to back.  She reports frequent episodes of hiccups after drinking .  New development EGD found narrowing  And was dilated.  She has not been back to see Dr. Servando Snare despite no improvemnt in her symptoms. She is only able to eat liquids and soups that are pureed. Despite her dysphagia she has gained 4 lbs .  She missed 3 weeks of work  During the hospitalization in Sept.   BP has been normal off medications    Past Medical History  Diagnosis Date  . Hypertension     no prior treatment  . Insomnia     chronic, no prior sleep study  . Esophageal stricture     dilated 2009,  elliott  . rhinitis  allergic     Past Surgical History  Procedure Laterality Date  . Cholecystectomy  2008  . Abdominal hysterectomy      no history of ca        The following portions of the patient's history were reviewed and updated as appropriate: Allergies, current medications, and problem list.    Review of Systems:   12 Pt  review of systems was negative except those addressed in the HPI,     History   Social History  . Marital Status: Married    Spouse Name: Ladona Rosten    Number of Children: 2  . Years of Education: 14   Occupational History  .     Social History Main Topics  . Smoking status: Never Smoker   . Smokeless tobacco: Never Used  . Alcohol Use: No  . Drug Use: No  . Sexual Activity: Not Currently   Other Topics Concern  . 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   Has he  r CNA but doesnot use it.     Objective:  Filed Vitals:   06/08/13  0927  BP: 118/86  Pulse: 77  Temp: 98.5 F (36.9 C)  Resp: 12     General appearance: alert, cooperative and appears stated age Ears: normal TM's and external ear canals both ears Throat: lips, mucosa, and tongue normal; teeth and gums normal Neck: no adenopathy, no carotid bruit, supple, symmetrical, trachea midline and thyroid not enlarged, symmetric, no tenderness/mass/nodules Back: symmetric, no curvature. ROM normal. No CVA tenderness. Lungs: clear to auscultation bilaterally Heart: regular rate and rhythm, S1, S2 normal, no murmur, click, rub or gallop Abdomen: soft, non-tender; bowel sounds normal; no masses,  no organomegaly Pulses: 2+ and symmetric Skin: Skin color, texture, turgor normal. No rashes or lesions Lymph nodes: Cervical, supraclavicular, and axillary nodes normal.  Assessment and Plan:  Generalized anxiety disorder Secondary or contributing to symptoms of  persistent chest pain.  Trial of paxil and qhs alprazolam discussed.   Esophageal stricture Despite recent dilation, symptoms persist. Recommend follow up with Dr. Servando Snare  CAD in native artery Apparently nonocclusive,  by recent diagnostic catheterization by [facility].  She is not taking an aspirin or  statin. Fasting lipid panel  Lab Results  Component Value Date   CHOL 187 06/08/2013   HDL 73.50 06/08/2013   LDLCALC 100* 06/08/2013   TRIG 67.0 06/08/2013   CHOLHDL 3 06/08/2013    Overweight (BMI 25.0-29.9) I have addressed  BMI and recommended wt loss of 10% of body weigh over the next 6 months using a low glycemic index diet and regular exercise a minimum of 5 days per week.    Screening for breast cancer Breast exam done.  Mammogram ordered/   A total of 40 minutes was spent with patient more than half of which was spent in counseling, reviewing records from other providers and coordination of care.  Updated Medication List Outpatient Encounter Prescriptions as of 06/08/2013  Medication Sig  . acyclovir (ZOVIRAX) 400 MG tablet TAKE 1 TABLET (400 MG TOTAL) BY MOUTH EVERY 4 (FOUR) HOURS WHILE AWAKE.  Marland Kitchen ALPRAZolam (XANAX) 0.5 MG tablet Take 1 tablet (0.5 mg total) by mouth at bedtime as needed for anxiety.  Marland Kitchen PARoxetine (PAXIL) 20 MG tablet Take 1 tablet (20 mg total) by mouth daily.  . [DISCONTINUED] amLODipine (NORVASC) 5 MG tablet Take 1 tablet (5 mg total) by mouth daily.  . [DISCONTINUED] HYDROcodone-homatropine (HYCODAN) 5-1.5 MG/5ML syrup Take 5 mLs by mouth every 6 (six) hours as needed for cough.  . [DISCONTINUED] hyoscyamine (LEVSIN SL) 0.125 MG SL tablet Place 1 tablet (0.125 mg total) under the tongue every 4 (four) hours as needed for cramping.  . [DISCONTINUED] omeprazole (PRILOSEC) 40 MG capsule Take 1 capsule (40 mg total) by mouth 2 (two) times daily.  . [DISCONTINUED] ondansetron (ZOFRAN-ODT) 8 MG disintegrating tablet Take 1 tablet (8 mg total) by mouth  every 8 (eight) hours as needed for nausea.  . [DISCONTINUED] PARoxetine (PAXIL) 10 MG tablet Take 1 tablet (10 mg total) by mouth daily.  . [DISCONTINUED] triamterene-hydrochlorothiazide (DYAZIDE) 37.5-25 MG per capsule TAKE ONE CAPSULE BY MOUTH EVERY MORNING     Orders Placed This Encounter  Procedures  . MM Digital Screening  . Tdap vaccine greater than or equal to 7yo IM  . HM PAP SMEAR  . Comprehensive metabolic panel  . TSH  . Lipid panel  . Vit D  25 hydroxy (rtn osteoporosis monitoring)  . Ambulatory referral to Physical Therapy    No Follow-up on file.

## 2013-06-08 NOTE — Progress Notes (Signed)
Pre-visit discussion using our clinic review tool. No additional management support is needed unless otherwise documented below in the visit note.  

## 2013-06-08 NOTE — Assessment & Plan Note (Addendum)
Secondary or contributing to symptoms of persistent chest pain.  Trial of paxil and qhs alprazolam discussed.

## 2013-06-10 ENCOUNTER — Encounter: Payer: Self-pay | Admitting: Internal Medicine

## 2013-06-10 DIAGNOSIS — E559 Vitamin D deficiency, unspecified: Secondary | ICD-10-CM | POA: Insufficient documentation

## 2013-06-10 MED ORDER — ERGOCALCIFEROL 1.25 MG (50000 UT) PO CAPS: 50000.0000 [IU] | ORAL_CAPSULE | ORAL | Status: DC

## 2013-06-10 NOTE — Assessment & Plan Note (Addendum)
Apparently nonocclusive,  by recent diagnostic catheterization by [facility].  She is not taking an aspirin or  statin. Fasting lipid panel  Lab Results  Component Value Date   CHOL 187 06/08/2013   HDL 73.50 06/08/2013   LDLCALC 100* 06/08/2013   TRIG 67.0 06/08/2013   CHOLHDL 3 06/08/2013

## 2013-06-10 NOTE — Assessment & Plan Note (Signed)
Despite recent dilation, symptoms persist. Recommend follow up with Dr. Servando Snare

## 2013-06-10 NOTE — Addendum Note (Signed)
Addended by: Sherlene Shams on: 06/10/2013 10:33 PM   Modules accepted: Orders

## 2013-06-10 NOTE — Assessment & Plan Note (Signed)
Breast exam done.  Mammogram ordered/

## 2013-06-10 NOTE — Assessment & Plan Note (Signed)
Drisdol

## 2013-06-10 NOTE — Assessment & Plan Note (Signed)
I have addressed  BMI and recommended wt loss of 10% of body weigh over the next 6 months using a low glycemic index diet and regular exercise a minimum of 5 days per week.   

## 2013-06-12 ENCOUNTER — Encounter: Payer: Self-pay | Admitting: *Deleted

## 2013-06-20 ENCOUNTER — Encounter: Payer: Self-pay | Admitting: Emergency Medicine

## 2013-07-02 ENCOUNTER — Telehealth: Payer: Self-pay | Admitting: Internal Medicine

## 2013-07-02 DIAGNOSIS — R928 Other abnormal and inconclusive findings on diagnostic imaging of breast: Secondary | ICD-10-CM

## 2013-07-02 NOTE — Telephone Encounter (Signed)
[facility] has reported that Her mammogram was abnormal on the right .   She will be contacted to get additional films and an ultrasound the facility.

## 2013-07-02 NOTE — Telephone Encounter (Signed)
Left message to return call to office.

## 2013-07-02 NOTE — Telephone Encounter (Signed)
Left message for patient to return call to office to verify [facility] has contacted patient .

## 2013-07-06 NOTE — Telephone Encounter (Signed)
I have not seen results.  Please request .  thanks

## 2013-07-06 NOTE — Telephone Encounter (Signed)
Spoke with pt, she returned for her ultrasound 07/04/13 at [facility]. Was told there were 2 places that would need a followup on, states our office was going to be in contact with her regarding that. I did not see the report in Dr. Melina Schools box today. Have you seen this note or do I need to call for it?

## 2013-07-09 NOTE — Telephone Encounter (Signed)
Called [facility] for report, states she will refax

## 2013-07-09 NOTE — Telephone Encounter (Addendum)
Report put in Dr. Melina Schools folder, 6 month followup recommended

## 2013-07-10 NOTE — Telephone Encounter (Signed)
Received,  6 month right breast follow up ordered

## 2013-07-17 ENCOUNTER — Ambulatory Visit: Payer: Commercial Managed Care - PPO | Admitting: Internal Medicine

## 2013-07-17 ENCOUNTER — Telehealth: Payer: Self-pay | Admitting: Internal Medicine

## 2013-07-27 ENCOUNTER — Encounter (INDEPENDENT_AMBULATORY_CARE_PROVIDER_SITE_OTHER): Payer: Self-pay

## 2013-07-27 ENCOUNTER — Encounter: Payer: Self-pay | Admitting: Internal Medicine

## 2013-07-27 ENCOUNTER — Encounter: Payer: Self-pay | Admitting: *Deleted

## 2013-07-27 ENCOUNTER — Ambulatory Visit (INDEPENDENT_AMBULATORY_CARE_PROVIDER_SITE_OTHER): Payer: Commercial Managed Care - PPO | Admitting: Internal Medicine

## 2013-07-27 VITALS — BP 138/76 | HR 83 | Temp 98.3°F | Resp 18 | Wt 180.2 lb

## 2013-07-27 DIAGNOSIS — J209 Acute bronchitis, unspecified: Secondary | ICD-10-CM

## 2013-07-27 DIAGNOSIS — R928 Other abnormal and inconclusive findings on diagnostic imaging of breast: Secondary | ICD-10-CM

## 2013-07-27 DIAGNOSIS — J189 Pneumonia, unspecified organism: Secondary | ICD-10-CM

## 2013-07-27 DIAGNOSIS — Z1239 Encounter for other screening for malignant neoplasm of breast: Secondary | ICD-10-CM

## 2013-07-27 LAB — CBC WITH DIFFERENTIAL/PLATELET
BASOS ABS: 0 10*3/uL (ref 0.0–0.1)
BASOS PCT: 0.9 % (ref 0.0–3.0)
EOS ABS: 0.2 10*3/uL (ref 0.0–0.7)
Eosinophils Relative: 3.3 % (ref 0.0–5.0)
HCT: 39.8 % (ref 36.0–46.0)
HEMOGLOBIN: 13.3 g/dL (ref 12.0–15.0)
Lymphocytes Relative: 34.2 % (ref 12.0–46.0)
Lymphs Abs: 1.8 10*3/uL (ref 0.7–4.0)
MCHC: 33.3 g/dL (ref 30.0–36.0)
MCV: 86 fl (ref 78.0–100.0)
MONOS PCT: 9.8 % (ref 3.0–12.0)
Monocytes Absolute: 0.5 10*3/uL (ref 0.1–1.0)
NEUTROS ABS: 2.7 10*3/uL (ref 1.4–7.7)
NEUTROS PCT: 51.8 % (ref 43.0–77.0)
Platelets: 257 10*3/uL (ref 150.0–400.0)
RBC: 4.63 Mil/uL (ref 3.87–5.11)
RDW: 13.8 % (ref 11.5–14.6)
WBC: 5.2 10*3/uL (ref 4.5–10.5)

## 2013-07-27 MED ORDER — AZITHROMYCIN 500 MG PO TABS: 500.0000 mg | ORAL_TABLET | Freq: Every day | ORAL | Status: DC

## 2013-07-27 MED ORDER — PREDNISONE (PAK) 10 MG PO TABS: ORAL_TABLET | ORAL | Status: DC

## 2013-07-27 MED ORDER — HYDROCOD POLST-CHLORPHEN POLST 10-8 MG/5ML PO LQCR: 10.0000 mL | Freq: Two times a day (BID) | ORAL | Status: DC | PRN

## 2013-07-27 NOTE — Patient Instructions (Signed)
I am treating you for walking pneumonia with azithromycin,  Prednisone taper  Hydrocodone cough syrup for the cough  Remain out of work until Thursday and rest  Referral to Dr Bary Castilla for second opinion on the breast issue

## 2013-07-27 NOTE — Progress Notes (Signed)
Pre-visit discussion using our clinic review tool. No additional management support is needed unless otherwise documented below in the visit note.  

## 2013-07-27 NOTE — Progress Notes (Signed)
Patient ID: Mary Bryant, female   DOB: October 14, 1965, 48 y.o.   MRN: [id number]   Patient Active Problem List   Diagnosis Date Noted  . CAP (community acquired pneumonia) 07/29/2013  . Hypovitaminosis D 06/10/2013  . Generalized anxiety disorder 06/08/2013  . Screening for breast cancer 06/08/2013  . CAD in native artery 06/08/2013  . H/O left bundle branch block 06/08/2013  . Overweight (BMI 25.0-29.9) 01/17/2012  . Insomnia   . Esophageal stricture     Subjective:  CC:   Chief Complaint  Patient presents with  . Follow-up    cough, pain right rib area,    HPI:   Mary Bryant a 48 y.o. female who presents for follow up on two issues :  She has been sick for 5 days with a hacking cough,  Malaise,  No fevers or myalgias,  Stayed home for 2 days.  Symptoms are now in chest , back and ribs with tightness and pleuritic pain with cough .  She has been coughing so hard she has sore  throat and loss of voice .  Lots of sinus congestion but no rhinitis,  Cough is worse at night.  No diarrhea.  Both daughters are  sick.    Abnormal mammogram on the right,  With ultrasound of right breast done  Mid January that suggested follow up needed in 6 months .  She is not comfortable waiting six months.     Past Medical History  Diagnosis Date  . Hypertension     no prior treatment  . Insomnia     chronic, no prior sleep study  . Esophageal stricture     dilated 2009,  elliott  . rhinitis allergic     Past Surgical History  Procedure Laterality Date  . Cholecystectomy  2008  . Abdominal hysterectomy      no history of ca        The following portions of the patient's history were reviewed and updated as appropriate: Allergies, current medications, and problem list.    Review of Systems:   12 Pt  review of systems was negative except those addressed in the HPI,     History   Social History  . Marital Status: Married    Spouse Name: [name]    Number of  Children: 2  . Years of Education: 14   Occupational History  .     Social History Main Topics  . Smoking status: Never Smoker   . Smokeless tobacco: Never Used  . Alcohol Use: No  . Drug Use: No  . Sexual Activity: Not Currently   Other Topics Concern  . 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   Has he  r CNA but doesnot use it.     Objective:  Filed Vitals:   07/27/13 0938  BP: 138/76  Pulse: 83  Temp: 98.3 F (36.8 C)  Resp: 18     General appearance: alert, cooperative and appears stated age Ears: normal TM's and external ear canals both ears Throat: lips, mucosa, and tongue normal; teeth and gums normal Neck: no adenopathy, no carotid bruit, supple, symmetrical, trachea midline and thyroid not enlarged, symmetric, no tenderness/mass/nodules Back: symmetric, no curvature. ROM normal. No CVA tenderness. Lungs: clear to auscultation bilaterally Heart: regular rate and  rhythm, S1, S2 normal, no murmur, click, rub or gallop Abdomen: soft, non-tender; bowel sounds normal; no masses,  no organomegaly Pulses: 2+ and symmetric Skin: Skin color, texture, turgor normal. No rashes or lesions Lymph nodes: Cervical, supraclavicular, and axillary nodes normal.  Assessment and Plan:  CAP (community acquired pneumonia) Azithromycin, prednisone taper and tussionex for cough (empiric)  Screening for breast cancer She has had an abnormal mammogram on the right followed by ultrasound at [facility] which suggested 6 month follow up on  2 places in right breast. .  Referral to Dr. Bary Castilla for further evaluation    Updated Medication List Outpatient Encounter Prescriptions as of 07/27/2013  Medication Sig  . acyclovir (ZOVIRAX) 400 MG tablet TAKE 1 TABLET (400 MG TOTAL) BY MOUTH EVERY 4 (FOUR) HOURS WHILE AWAKE.  . ergocalciferol (DRISDOL) 50000 UNITS  capsule Take 1 capsule (50,000 Units total) by mouth once a week.  . esomeprazole (NEXIUM) 20 MG capsule Take 20 mg by mouth daily at 12 noon.  Marland Kitchen PARoxetine (PAXIL) 20 MG tablet Take 1 tablet (20 mg total) by mouth daily.  Marland Kitchen ALPRAZolam (XANAX) 0.5 MG tablet Take 1 tablet (0.5 mg total) by mouth at bedtime as needed for anxiety.  Marland Kitchen azithromycin (ZITHROMAX) 500 MG tablet Take 1 tablet (500 mg total) by mouth daily.  . chlorpheniramine-HYDROcodone (TUSSIONEX) 10-8 MG/5ML LQCR Take 10 mLs by mouth every 12 (twelve) hours as needed for cough.  . predniSONE (STERAPRED UNI-PAK) 10 MG tablet 6 tablets on Day 1 , then reduce by 1 tablet daily until gone     Orders Placed This Encounter  Procedures  . Legionella antigen, urine  . CBC with Differential  . Ambulatory referral to General Surgery    No Follow-up on file.

## 2013-07-29 ENCOUNTER — Encounter: Payer: Self-pay | Admitting: Internal Medicine

## 2013-07-29 DIAGNOSIS — J189 Pneumonia, unspecified organism: Secondary | ICD-10-CM | POA: Insufficient documentation

## 2013-07-29 LAB — LEGIONELLA ANTIGEN, URINE: RESULTS - LGAGUR: NEGATIVE

## 2013-07-29 NOTE — Assessment & Plan Note (Signed)
Azithromycin, prednisone taper and tussionex for cough (empiric)

## 2013-07-29 NOTE — Assessment & Plan Note (Addendum)
She has had an abnormal mammogram on the right followed by ultrasound at [facility] which suggested 6 month follow up on  2 places in right breast. .  Referral to Dr. Bary Castilla for further evaluation

## 2013-07-30 ENCOUNTER — Encounter: Payer: Self-pay | Admitting: Internal Medicine

## 2013-08-03 ENCOUNTER — Encounter: Payer: Self-pay | Admitting: Internal Medicine

## 2013-08-08 ENCOUNTER — Ambulatory Visit (INDEPENDENT_AMBULATORY_CARE_PROVIDER_SITE_OTHER): Payer: Commercial Managed Care - PPO | Admitting: General Surgery

## 2013-08-08 ENCOUNTER — Ambulatory Visit: Payer: Commercial Managed Care - PPO

## 2013-08-08 ENCOUNTER — Encounter: Payer: Self-pay | Admitting: General Surgery

## 2013-08-08 VITALS — BP 170/100 | HR 78 | Resp 14 | Ht 68.0 in | Wt 179.0 lb

## 2013-08-08 DIAGNOSIS — Z9889 Other specified postprocedural states: Secondary | ICD-10-CM | POA: Insufficient documentation

## 2013-08-08 DIAGNOSIS — N63 Unspecified lump in unspecified breast: Secondary | ICD-10-CM

## 2013-08-08 DIAGNOSIS — R928 Other abnormal and inconclusive findings on diagnostic imaging of breast: Secondary | ICD-10-CM

## 2013-08-08 NOTE — Progress Notes (Signed)
Patient ID: Mary Bryant, female   DOB: 1965/09/23, 48 y.o.   MRN: [id number]  Chief Complaint  Patient presents with  . Other    mammgram    HPI Mary Bryant is a 48 y.o. female who presents for a breast evaluation. The most recent mammogram and ultrasound was done on 06/28/13  At [facility]. Patient does perform regular self breast checks and gets regular mammograms done. Patient states no trauma  to the breast.   The patient was accompanied today by her mother, [name] who was present for the interview and exam.  The patient reports she has not been aware of cyclic breast tenderness since her hysterectomy in 2000.  HPI  Past Medical History  Diagnosis Date  . Hypertension     no prior treatment  . Insomnia     chronic, no prior sleep study  . Esophageal stricture     dilated 2009,  elliott  . rhinitis allergic     Past Surgical History  Procedure Laterality Date  . Cholecystectomy  2008  . Abdominal hysterectomy  2000    no history of ca     Family History  Problem Relation Age of Onset  . Cancer Father     Bladder,  in Hospice  . Diabetes type II Father   . COPD Father   . Coronary artery disease Father   . Heart disease Father 66  . Diabetes Mother   . Hyperlipidemia Mother   . Hypertension Mother   . Early death Paternal Grandmother   . Heart disease Paternal Grandmother     CAD    Social History History  Substance Use Topics  . Smoking status: Never Smoker   . Smokeless tobacco: Never Used  . Alcohol Use: No    Allergies  Allergen Reactions  . Benadryl [Diphenhydramine Hcl]   . Promethazine Hcl     Current Outpatient Prescriptions  Medication Sig Dispense Refill  . acyclovir (ZOVIRAX) 400 MG tablet TAKE 1 TABLET (400 MG TOTAL) BY MOUTH EVERY 4 (FOUR) HOURS WHILE AWAKE.  25 tablet  0  . ALPRAZolam (XANAX) 0.5 MG tablet Take 1 tablet (0.5 mg total) by mouth at bedtime as needed for anxiety.  30 tablet  3  . azithromycin (ZITHROMAX)  500 MG tablet Take 1 tablet (500 mg total) by mouth daily.  7 tablet  0  . chlorpheniramine-HYDROcodone (TUSSIONEX) 10-8 MG/5ML LQCR Take 10 mLs by mouth every 12 (twelve) hours as needed for cough.  240 mL  0  . ergocalciferol (DRISDOL) 50000 UNITS capsule Take 1 capsule (50,000 Units total) by mouth once a week.  12 capsule  0  . esomeprazole (NEXIUM) 20 MG capsule Take 20 mg by mouth daily at 12 noon.      Marland Kitchen PARoxetine (PAXIL) 20 MG tablet Take 1 tablet (20 mg total) by mouth daily.  30 tablet  2  . predniSONE (STERAPRED UNI-PAK) 10 MG tablet 6 tablets on Day 1 , then reduce by 1 tablet daily until gone  21 tablet  0   No current facility-administered medications for this visit.    Review of Systems Review of Systems  Constitutional: Negative.   Respiratory: Negative.   Cardiovascular: Negative.     Blood pressure 170/100, pulse 78, resp. rate 14, height 5\' 8"  (1.727 m), weight 179 lb (81.194 kg).  Physical Exam Physical Exam  Constitutional: She is oriented to person, place, and time. She appears well-developed and well-nourished.  Eyes: Conjunctivae are normal.  Neck: Neck supple.  Cardiovascular: Normal rate, regular rhythm and normal heart sounds.   Pulmonary/Chest: Effort normal and breath sounds normal. Right breast exhibits no inverted nipple, no mass, no nipple discharge, no skin change and no tenderness. Left breast exhibits no inverted nipple, no mass, no nipple discharge, no skin change and no tenderness.  Abdominal: Soft. Bowel sounds are normal. There is no hepatomegaly. There is no tenderness. No hernia.  Lymphadenopathy:    She has no cervical adenopathy.    She has no axillary adenopathy.  Neurological: She is alert and oriented to person, place, and time.  Skin: Skin is warm and dry.    Data Reviewed Bilateral mammograms dated June 28, 2013 suggested an asymmetric density upper outer quadrant of the right breast for which additional views were requested.  BI-RAD-0.  Focal spot compression views and ultrasound dated July 04, 2013 showed the density in the upper-outer quadrant resolved on focal spot compression views. Ultrasound identified a 0.5 x 0.8 cm mass overlying the pectoralis muscle in the 8:30-9:00 position. BI-RAD-3.    Assessment    Right breast nodule, likely benign based on ultrasound appearance.       Plan    The patient Understands that the areas likely benign, but she is a single parent raising 2 daughters and she is concerned about the possibility of a missed malignancy. She desired to proceed to biopsy. The biopsy procedure was reviewed including its risks associated with bleeding, infection pain.  Ultrasound examination of the right breast and 8:00 position showed a 0.5 x 0.5 x 0.7 hypoechoic nodule in the 8:00 position 6 cm from the nipple. This did lie over the pectoralis fascia. There was a suggestion of posterior shadowing on one view.  10 cc of 0.5% Xylocaine with 0.25% Marcaine with one 200,000 epinephrine was utilized for local anesthesia well tolerated. A 14-gauge Venous biopsy device was passed through the lesion and approximately 8 core samples obtained with near complete removal of the lesion. A postbiopsy clip was placed. Skin defect was closed with benzoin and Steri-Strips followed by Telfa and Tegaderm dressing.  The procedure was well tolerated.  The patient was provided with written instructions regard to wound care. Assuming of benign pathology she will return in one week for examination by the staff.   A 6 month followup right mammogram at [facility] will be obtained with an office visit to follow.        Robert Bellow 08/08/2013, 9:31 PM

## 2013-08-08 NOTE — Patient Instructions (Addendum)

## 2013-08-10 ENCOUNTER — Telehealth: Payer: Self-pay | Admitting: General Surgery

## 2013-08-10 LAB — PATHOLOGY

## 2013-08-10 NOTE — Telephone Encounter (Signed)
I spoke with Delorse Lek, M.D. from pathology who reported at the biopsy showed some stromal fibrosis as well as what looked to be a cystic structure. This would likely correspond to the benign ultrasound appearance. The patient has been notified of the benign results and reports that she tolerated the biopsy well.  She will stop by the office next week for staff assessment of the wound and we'll plan for a right breast mammogram at [facility] with a followup office exam here in 6 months.

## 2013-08-15 ENCOUNTER — Ambulatory Visit (INDEPENDENT_AMBULATORY_CARE_PROVIDER_SITE_OTHER): Payer: Commercial Managed Care - PPO | Admitting: *Deleted

## 2013-08-15 DIAGNOSIS — N63 Unspecified lump in unspecified breast: Secondary | ICD-10-CM

## 2013-08-15 NOTE — Progress Notes (Signed)
Patient here today for follow up post right breast biopsy.  No dressing or steri strip. No bruising noted.  The patient is aware that a heating pad may be used for comfort as needed.  Aware of pathology. Follow up as scheduled.

## 2013-08-15 NOTE — Patient Instructions (Signed)
Continue self breast exams. Call office for any new breast issues or concerns. 

## 2013-10-25 DIAGNOSIS — K279 Peptic ulcer, site unspecified, unspecified as acute or chronic, without hemorrhage or perforation: Secondary | ICD-10-CM

## 2013-10-25 DIAGNOSIS — K449 Diaphragmatic hernia without obstruction or gangrene: Secondary | ICD-10-CM | POA: Insufficient documentation

## 2013-10-25 DIAGNOSIS — K2100 Gastro-esophageal reflux disease with esophagitis, without bleeding: Secondary | ICD-10-CM | POA: Insufficient documentation

## 2013-10-25 DIAGNOSIS — B9681 Helicobacter pylori [H. pylori] as the cause of diseases classified elsewhere: Secondary | ICD-10-CM

## 2013-10-25 DIAGNOSIS — F410 Panic disorder [episodic paroxysmal anxiety] without agoraphobia: Secondary | ICD-10-CM | POA: Insufficient documentation

## 2013-10-25 DIAGNOSIS — F419 Anxiety disorder, unspecified: Secondary | ICD-10-CM | POA: Insufficient documentation

## 2013-10-25 DIAGNOSIS — F411 Generalized anxiety disorder: Secondary | ICD-10-CM | POA: Insufficient documentation

## 2013-10-25 HISTORY — DX: Helicobacter pylori (H. pylori) as the cause of diseases classified elsewhere: B96.81

## 2013-11-21 DIAGNOSIS — R51 Headache: Secondary | ICD-10-CM

## 2013-11-21 DIAGNOSIS — R519 Headache, unspecified: Secondary | ICD-10-CM | POA: Insufficient documentation

## 2014-01-07 ENCOUNTER — Emergency Department (HOSPITAL_COMMUNITY)
Admission: EM | Admit: 2014-01-07 | Discharge: 2014-01-07 | Disposition: A | Discharge status: Home / Self Care | Payer: Commercial Managed Care - PPO | Attending: Emergency Medicine | Admitting: Emergency Medicine

## 2014-01-07 ENCOUNTER — Emergency Department (HOSPITAL_COMMUNITY): Payer: Commercial Managed Care - PPO

## 2014-01-07 ENCOUNTER — Telehealth: Payer: Self-pay | Admitting: Internal Medicine

## 2014-01-07 ENCOUNTER — Encounter (HOSPITAL_COMMUNITY): Payer: Self-pay | Admitting: Emergency Medicine

## 2014-01-07 DIAGNOSIS — Z8719 Personal history of other diseases of the digestive system: Secondary | ICD-10-CM | POA: Insufficient documentation

## 2014-01-07 DIAGNOSIS — IMO0002 Reserved for concepts with insufficient information to code with codable children: Secondary | ICD-10-CM | POA: Insufficient documentation

## 2014-01-07 DIAGNOSIS — R251 Tremor, unspecified: Secondary | ICD-10-CM | POA: Diagnosis present

## 2014-01-07 DIAGNOSIS — M549 Dorsalgia, unspecified: Secondary | ICD-10-CM | POA: Insufficient documentation

## 2014-01-07 DIAGNOSIS — Z792 Long term (current) use of antibiotics: Secondary | ICD-10-CM | POA: Insufficient documentation

## 2014-01-07 DIAGNOSIS — R259 Unspecified abnormal involuntary movements: Secondary | ICD-10-CM | POA: Insufficient documentation

## 2014-01-07 DIAGNOSIS — Z3202 Encounter for pregnancy test, result negative: Secondary | ICD-10-CM | POA: Insufficient documentation

## 2014-01-07 DIAGNOSIS — R51 Headache: Secondary | ICD-10-CM | POA: Insufficient documentation

## 2014-01-07 DIAGNOSIS — Z79899 Other long term (current) drug therapy: Secondary | ICD-10-CM | POA: Insufficient documentation

## 2014-01-07 DIAGNOSIS — I10 Essential (primary) hypertension: Secondary | ICD-10-CM | POA: Insufficient documentation

## 2014-01-07 LAB — URINALYSIS, ROUTINE W REFLEX MICROSCOPIC
BILIRUBIN URINE: NEGATIVE
Glucose, UA: NEGATIVE mg/dL
Hgb urine dipstick: NEGATIVE
Ketones, ur: NEGATIVE mg/dL
Leukocytes, UA: NEGATIVE
Nitrite: NEGATIVE
Protein, ur: NEGATIVE mg/dL
Specific Gravity, Urine: 1.029 (ref 1.005–1.030)
UROBILINOGEN UA: 0.2 mg/dL (ref 0.0–1.0)
pH: 5.5 (ref 5.0–8.0)

## 2014-01-07 LAB — CBC WITH DIFFERENTIAL/PLATELET
Basophils Absolute: 0.1 10*3/uL (ref 0.0–0.1)
Basophils Relative: 1 % (ref 0–1)
EOS ABS: 0.1 10*3/uL (ref 0.0–0.7)
EOS PCT: 2 % (ref 0–5)
HCT: 39.9 % (ref 36.0–46.0)
HEMOGLOBIN: 13.2 g/dL (ref 12.0–15.0)
Lymphocytes Relative: 33 % (ref 12–46)
Lymphs Abs: 2 10*3/uL (ref 0.7–4.0)
MCH: 28.8 pg (ref 26.0–34.0)
MCHC: 33.1 g/dL (ref 30.0–36.0)
MCV: 87.1 fL (ref 78.0–100.0)
MONOS PCT: 12 % (ref 3–12)
Monocytes Absolute: 0.7 10*3/uL (ref 0.1–1.0)
Neutro Abs: 3.2 10*3/uL (ref 1.7–7.7)
Neutrophils Relative %: 52 % (ref 43–77)
Platelets: 282 10*3/uL (ref 150–400)
RBC: 4.58 MIL/uL (ref 3.87–5.11)
RDW: 14 % (ref 11.5–15.5)
WBC: 6.1 10*3/uL (ref 4.0–10.5)

## 2014-01-07 LAB — COMPREHENSIVE METABOLIC PANEL
ALT: 16 U/L (ref 0–35)
AST: 20 U/L (ref 0–37)
Albumin: 3.5 g/dL (ref 3.5–5.2)
Alkaline Phosphatase: 89 U/L (ref 39–117)
BUN: 7 mg/dL (ref 6–23)
CO2: 25 mEq/L (ref 19–32)
CREATININE: 0.87 mg/dL (ref 0.50–1.10)
Calcium: 9.7 mg/dL (ref 8.4–10.5)
Chloride: 105 mEq/L (ref 96–112)
GFR calc Af Amer: >90 mL/min (ref >90)
GFR calc non Af Amer: 79 mL/min — ABNORMAL LOW (ref >90)
Glucose, Bld: 103 mg/dL — ABNORMAL HIGH (ref 70–99)
POTASSIUM: 4 meq/L (ref 3.7–5.3)
Sodium: 141 mEq/L (ref 137–147)
TOTAL PROTEIN: 7 g/dL (ref 6.0–8.3)
Total Bilirubin: 0.3 mg/dL (ref 0.3–1.2)

## 2014-01-07 LAB — POC URINE PREG, ED: Preg Test, Ur: NEGATIVE

## 2014-01-07 MED ORDER — SODIUM CHLORIDE 0.9 % IV BOLUS (SEPSIS): 1000.0000 mL | INTRAVENOUS | Status: DC

## 2014-01-07 MED ORDER — METOCLOPRAMIDE HCL 5 MG/ML IJ SOLN
5.0000 mg | Freq: Once | INTRAMUSCULAR | Status: DC
Start: 2014-01-07 — End: 2014-01-07

## 2014-01-07 NOTE — Telephone Encounter (Signed)
You have a spot held at 2.30, please advise.

## 2014-01-07 NOTE — ED Notes (Signed)
Pt. Stated, I've had 3 seizures since this morning.  Im having back pain, headache.

## 2014-01-07 NOTE — ED Provider Notes (Signed)
CSN: [id number]     Arrival date & time 01/07/14  1108 History   First MD Initiated Contact with Patient 01/07/14 1205     Chief Complaint  Patient presents with  . Seizures  . Back Pain  . Headache     (Consider location/radiation/quality/duration/timing/severity/associated sxs/prior Treatment) Patient is a 48 y.o. female presenting with seizures, back pain, and headaches. The history is provided by the patient.  Seizures Seizure activity on arrival: yes   Seizure type:  Grand mal Initial focality:  None Episode characteristics: generalized shaking   Postictal symptoms comment:  None Return to baseline: yes   Severity:  Mild Duration: 4-5 minutes. Timing:  Intermittent Number of seizures this episode:  2 in lobby, 1 at bedside Progression:  Worsening Recent head injury:  No recent head injuries PTA treatment:  None History of seizures: yes   Back Pain Associated symptoms: headaches   Associated symptoms: no abdominal pain, no chest pain, no dysuria and no fever   Headache Associated symptoms: seizures   Associated symptoms: no abdominal pain, no back pain, no congestion, no cough, no diarrhea, no dizziness, no pain, no fatigue, no fever, no nausea, no neck pain and no vomiting     Past Medical History  Diagnosis Date  . Hypertension     no prior treatment  . Insomnia     chronic, no prior sleep study  . Esophageal stricture     dilated 2009,  elliott  . rhinitis allergic    Past Surgical History  Procedure Laterality Date  . Cholecystectomy  2008  . Abdominal hysterectomy  2000    no history of ca    Family History  Problem Relation Age of Onset  . Cancer Father     Bladder,  in Hospice  . Diabetes type II Father   . COPD Father   . Coronary artery disease Father   . Heart disease Father 5  . Diabetes Mother   . Hyperlipidemia Mother   . Hypertension Mother   . Early death Paternal Grandmother   . Heart disease Paternal Grandmother     CAD    History  Substance Use Topics  . Smoking status: Never Smoker   . Smokeless tobacco: Never Used  . Alcohol Use: No   OB History   Grav Para Term Preterm Abortions TAB SAB Ect Mult Living   3 2   1  1   2      Obstetric Comments   1st Menstrual Cycle:  15 1st Pregnancy:  24     Review of Systems  Constitutional: Negative for fever and fatigue.  HENT: Negative for congestion and drooling.   Eyes: Negative for pain.  Respiratory: Negative for cough and shortness of breath.   Cardiovascular: Negative for chest pain.  Gastrointestinal: Negative for nausea, vomiting, abdominal pain and diarrhea.  Genitourinary: Negative for dysuria and hematuria.  Musculoskeletal: Negative for back pain, gait problem and neck pain.  Skin: Negative for color change.  Neurological: Positive for seizures and headaches. Negative for dizziness.  Hematological: Negative for adenopathy.  Psychiatric/Behavioral: Negative for behavioral problems.  All other systems reviewed and are negative.     Allergies  Benadryl and Promethazine hcl  Home Medications   Prior to Admission medications   Medication Sig Start Date End Date Taking? Authorizing Provider  acyclovir (ZOVIRAX) 400 MG tablet TAKE 1 TABLET (400 MG TOTAL) BY MOUTH EVERY 4 (FOUR) HOURS WHILE AWAKE. 02/06/13   Crecencio Mc, MD  ALPRAZolam (  XANAX) 0.5 MG tablet Take 1 tablet (0.5 mg total) by mouth at bedtime as needed for anxiety. 06/08/13   Crecencio Mc, MD  azithromycin (ZITHROMAX) 500 MG tablet Take 1 tablet (500 mg total) by mouth daily. 07/27/13   Crecencio Mc, MD  chlorpheniramine-HYDROcodone (TUSSIONEX) 10-8 MG/5ML LQCR Take 10 mLs by mouth every 12 (twelve) hours as needed for cough. 07/27/13   Crecencio Mc, MD  ergocalciferol (DRISDOL) 50000 UNITS capsule Take 1 capsule (50,000 Units total) by mouth once a week. 06/10/13   Crecencio Mc, MD  esomeprazole (NEXIUM) 20 MG capsule Take 20 mg by mouth daily at 12 noon.    Historical  Provider, MD  PARoxetine (PAXIL) 20 MG tablet Take 1 tablet (20 mg total) by mouth daily. 06/08/13   Crecencio Mc, MD  predniSONE (STERAPRED UNI-PAK) 10 MG tablet 6 tablets on Day 1 , then reduce by 1 tablet daily until gone 07/27/13   Crecencio Mc, MD   BP 138/78  Pulse 71  Temp(Src) 98.5 F (36.9 C) (Oral)  Resp 16  Ht 5\' 8"  (1.727 m)  Wt 180 lb (81.647 kg)  BMI 27.38 kg/m2  SpO2 98% Physical Exam  Nursing note and vitals reviewed. Constitutional: She is oriented to person, place, and time. She appears well-developed and well-nourished.  HENT:  Head: Normocephalic.  Mouth/Throat: No oropharyngeal exudate.  Eyes: Conjunctivae and EOM are normal. Pupils are equal, round, and reactive to light.  Neck: Normal range of motion. Neck supple.  Cardiovascular: Normal rate, regular rhythm, normal heart sounds and intact distal pulses.  Exam reveals no gallop and no friction rub.   No murmur heard. Pulmonary/Chest: Effort normal and breath sounds normal. No respiratory distress. She has no wheezes.  Abdominal: Soft. Bowel sounds are normal. There is no tenderness. There is no rebound and no guarding.  Musculoskeletal: Normal range of motion. She exhibits no edema and no tenderness.  Neurological: She is alert and oriented to person, place, and time.  alert, oriented x3 speech: normal in context and clarity memory: intact grossly cranial nerves II-XII: intact motor strength: full proximally and distally no involuntary movements or tremors sensation: intact to light touch diffusely  cerebellar: finger-to-nose and heel-to-shin intact gait: normal forwards and backwards, normal tandem gait   Skin: Skin is warm and dry.  Psychiatric: She has a normal mood and affect. Her behavior is normal.    ED Course  Procedures (including critical care time) Labs Review Labs Reviewed  COMPREHENSIVE METABOLIC PANEL - Abnormal; Notable for the following:    Glucose, Bld 103 (*)    GFR calc non Af  Amer 79 (*)    All other components within normal limits  CBC WITH DIFFERENTIAL  URINALYSIS, ROUTINE W REFLEX MICROSCOPIC  POC URINE PREG, ED    Imaging Review No results found.   EKG Interpretation None      MDM   Final diagnoses:  Occasional tremors    12:35 PM 48 y.o. female w hx of HTN, shaking episodes who presents with multiple shaking episodes which began last night. She has had 5 episodes since that time each lasting for 5 minutes. She had one on exam. She was easily distracted during the episode and it did not appear to be a seizure. She has had these for approximately 6 months to one year. She's not on any antiseizure medications. She is had CT scans of her head, an EEG, and evaluation by neurology at [facility]. Since she was  having increased frequency of these episodes since last night the family called her PCP who recommended she come to the ER for evaluation. She states that she is having an 8/10 headache on the vertex of her head and some mild back pain. She typically has these symptoms after she has a shaking episode. She otherwise has a normal neurologic exam. Will get screening labwork and imaging. Will attempt to get [facility] records.  I reviewed the workup at [facility] in Sept 2014. Neurology saw pt. She had non-contrib EEG and CT of head. The notes state that she was dx w/ conversion disorder and this was discussed w/ her and the family at the time. After witnessing the episode of shaking here where she was easily distracted and had no postictal sx I would agree that these are not clinically seizures. Will recommend outpt f/u w/ neurology as her work up today is non-contrib and she continues to appear well. I discussed this w/ family and they are happy w/ this plan.   2:38 PM:  I have discussed the diagnosis/risks/treatment options with the patient and family and believe the pt to be eligible for discharge home to follow-up with neurology as outpt. We also discussed  returning to the ED immediately if new or worsening sx occur. We discussed the sx which are most concerning (e.g., worsening episodes, fever, AMS) that necessitate immediate return. Medications administered to the patient during their visit and any new prescriptions provided to the patient are listed below.  Medications given during this visit Medications - No data to display  New Prescriptions   No medications on file     Blanchard Kelch, MD 01/07/14 1907

## 2014-01-07 NOTE — Telephone Encounter (Signed)
Patient needs to go to Er if she has had 2 seizures in the last 24 hours.

## 2014-01-07 NOTE — ED Notes (Signed)
Patient transported to CT 

## 2014-01-07 NOTE — Telephone Encounter (Signed)
Patient Information:  Caller Name: Eliani  Phone: [phone]  Patient: Mary Bryant, Mary Bryant  Gender: Female  DOB: 1966-02-17  Age: 48 Years  PCP: Deborra Medina (Adults only)  Pregnant: No  Office Follow Up:  Does the office need to follow up with this patient?: Yes  Instructions For The Office: Requesting appointment with Dr. Derrel Nip if she can be worked in. Had 2 seizures last night and wanting to be seen today. Requesting referral to another Neurologist. Cardiolotist advised that Sx are stress induced and has referred her back to PCP  RN Note:  Requesting appointment in the office. Refusing ED. Lives 30 min from office. Please call with any concellations. She does not take any seizure medications. Has only seen Neurologist 1x- referred by Cardiology. May need referral to see another Neurologist, unsure if meds will help prevent seizures since may be r/t LLBB. Told that she may need to have a pacemaker.  Symptoms  Reason For Call & Symptoms: Calling about having [place] bundle block and had 3 Siezures last night- one around 0200 that lasted for 20 min and had 2 more later in the night and unsure how long others lasted . Takes Paxil at night and it makes her sleepy but  still wakes up after and feels sore and short of breath and weak. Today she is nauseous and back is hurting on the left side, and she has a headache.. She is unable to go to work and requesting appointment.  Reviewed Health History In EMR: Yes  Reviewed Medications In EMR: Yes  Reviewed Allergies In EMR: Yes  Reviewed Surgeries / Procedures: Yes  Date of Onset of Symptoms: 01/07/2014 OB / GYN:  LMP: Unknown  Guideline(s) Used:  Seizure  Disposition Per Guideline:   Go to ED Now (or to Office with PCP Approval)  Reason For Disposition Reached:   Wants to sleep after the seizure and persists much longer than usual  Advice Given:  Reassurance  This seizure did not last very long and there is a history of prior  seizures. There is no need to go to the emergency room for every seizure.  Here is some care advice that should help.  Headache  Most people after a seizure will have a headache.  If the seizure has stopped and the person is now awake and alert, it is OK to treat the headache with acetaminophen (Tylenol; 650 - 1000 mg by mouth).  Sleep  Let the seizure victim sleep if he/she wishes (Reason: The brain is temporarily exhausted, and sleep is restorative).  Check the individual frequently for any breathing problems.  Expected Course  : After a brief seizure, most people feel normal within 1 to 2 hours.  Call Back If:  Another seizure occurs  Fever or severe headache  Stays confused (e.g., disoriented or slurred speech) more than 30 minutes  Wants to sleep more than 2 hours (or longer than usual)  Patient becomes worse or you have more questions.  Patient Will Follow Care Advice:  YES

## 2014-01-07 NOTE — Telephone Encounter (Signed)
Patient stated she will do as advised and go to the ER now.

## 2014-01-31 ENCOUNTER — Telehealth: Payer: Self-pay | Admitting: Internal Medicine

## 2014-01-31 NOTE — Telephone Encounter (Signed)
One O'clock tomorrow  can you schedule? I have left message for patient to return call to office for the appointment .

## 2014-01-31 NOTE — Telephone Encounter (Signed)
Please advise I know you said we could use a 1 Pm spot for hospital follow ups but what day.

## 2014-01-31 NOTE — Telephone Encounter (Signed)
Tomorrow is fine

## 2014-01-31 NOTE — Telephone Encounter (Signed)
The patient was in the hospital a couple of weeks ago for seizures and she is needing an appointment . Please advise.

## 2014-02-01 ENCOUNTER — Ambulatory Visit (INDEPENDENT_AMBULATORY_CARE_PROVIDER_SITE_OTHER): Payer: Commercial Managed Care - PPO | Admitting: Internal Medicine

## 2014-02-01 ENCOUNTER — Encounter: Payer: Self-pay | Admitting: Internal Medicine

## 2014-02-01 VITALS — BP 160/104 | HR 80 | Temp 98.7°F | Resp 18 | Ht 68.0 in | Wt 188.5 lb

## 2014-02-01 DIAGNOSIS — F445 Conversion disorder with seizures or convulsions: Secondary | ICD-10-CM

## 2014-02-01 DIAGNOSIS — R251 Tremor, unspecified: Secondary | ICD-10-CM

## 2014-02-01 DIAGNOSIS — R569 Unspecified convulsions: Secondary | ICD-10-CM

## 2014-02-01 DIAGNOSIS — I10 Essential (primary) hypertension: Secondary | ICD-10-CM

## 2014-02-01 DIAGNOSIS — F449 Dissociative and conversion disorder, unspecified: Secondary | ICD-10-CM

## 2014-02-01 DIAGNOSIS — R259 Unspecified abnormal involuntary movements: Secondary | ICD-10-CM

## 2014-02-01 DIAGNOSIS — F444 Conversion disorder with motor symptom or deficit: Secondary | ICD-10-CM

## 2014-02-01 MED ORDER — PAROXETINE HCL 20 MG PO TABS: 20.0000 mg | ORAL_TABLET | Freq: Every day | ORAL | Status: DC

## 2014-02-01 MED ORDER — METOPROLOL SUCCINATE ER 25 MG PO TB24: 25.0000 mg | ORAL_TABLET | Freq: Every day | ORAL | Status: DC

## 2014-02-01 NOTE — Patient Instructions (Addendum)
Here are the names of several well respected female therapists  In [facility] who are very well respected   Lennon Alstrom    [phone] [place]   [phone] [place] [phone]  Achilles Dunk  [phone]  Altha Harm   I will also start a referral to Dr. Nicolasa Ducking bc it may take several weeks to see her.   I have increased your paxil to 20 mg daily.   For your blood pressure  Metoprolol XL 25 mfg daily  Goal with bp is 130/80,  Pulse around 60 or higher  If not there after one week of metoprolol let me know

## 2014-02-01 NOTE — Progress Notes (Signed)
Pre-visit discussion using our clinic review tool. No additional management support is needed unless otherwise documented below in the visit note.  

## 2014-02-01 NOTE — Progress Notes (Addendum)
Patient ID: Mary Bryant, female   DOB: 02-05-1966, 48 y.o.   MRN: [id number]   Patient Active Problem List   Diagnosis Date Noted  . Conversion disorder with abnormal movement, persistent, with psychological stressor 02/03/2014  . Occasional tremors 01/07/2014  . Abnormal mammogram 08/08/2013  . Lump or mass in breast 08/08/2013  . CAP (community acquired pneumonia) 07/29/2013  . Hypovitaminosis D 06/10/2013  . Generalized anxiety disorder 06/08/2013  . Screening for breast cancer 06/08/2013  . CAD in native artery 06/08/2013  . H/O left bundle branch block 06/08/2013  . Overweight (BMI 25.0-29.9) 01/17/2012  . Insomnia   . Esophageal stricture     Subjective:  CC:   Chief Complaint  Patient presents with  . Follow-up    hospital 01/07/14    HPI:   Mary Bryant is a 48 y.o. 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    She describes uncontrolled tremors and convulsions occurring even while at work . She works a full time job in  Therapist, art for [employer]  And another part time job 20 hrs per week.  She has become increasingly anxious and apprehensive at work.  She feels absolutely exhausted 100% of the time. Recently she left work early ,  Slept for 6 hours, got up to eat , then slept another 24 hours.    Her anxiety is aggravated by family dysfunction.  She is supporting her grown daughter , daughter's boyfriend, both of who are not working and daughter's 2 yr old child.  daughter refuses to live in subsidized housing because her boyfriend cannot be on the lease because he is a convicted felon.  This arrangement has been going on for 2 years.     She is requesting pharmacotherapy and counselling for her generalized anxiety and her supervisor is supportive of a 12 week absence .     Past Medical History  Diagnosis Date  . Hypertension      no prior treatment  . Insomnia     chronic, no prior sleep study  . Esophageal stricture     dilated 2009,  [facility]  . rhinitis allergic     Past Surgical History  Procedure Laterality Date  . Cholecystectomy  2008  . Abdominal hysterectomy  2000    no history of ca        The following portions of the patient's history were reviewed and updated as appropriate: Allergies, current medications, and problem list.    Review of Systems:   Patient denies headache, fevers, malaise, unintentional weight loss, skin rash, eye pain, sinus congestion and sinus pain, sore throat, dysphagia,  hemoptysis , cough, dyspnea, wheezing, chest pain, palpitations, orthopnea, edema, abdominal pain, nausea, melena, diarrhea, constipation, flank pain, dysuria, hematuria, urinary  Frequency, nocturia, numbness, tingling, seizures,  Focal weakness, Loss of consciousness,  Tremor, insomnia, depression, anxiety, and suicidal ideation.     History   Social History  . Marital Status: Single    Spouse Name: Elizabelle Fite    Number of Children: 2  . Years of Education: 14   Occupational History  .     Social History Main Topics  . Smoking status: Never Smoker   . Smokeless tobacco: Never Used  . Alcohol Use: No  . Drug Use: No  . Sexual Activity: Not Currently   Other Topics Concern  . Not on file  Social History Narrative   Works 2 full time jobs,  At [employer] and office work for  [employer].  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   Has he  r CNA but doesnot use it.     Objective:  Filed Vitals:   02/01/14 1335  BP: 160/104  Pulse: 80  Temp: 98.7 F (37.1 C)  Resp: 18     General appearance: alert, cooperative and appears stated age Ears: normal TM's and external ear canals both ears Throat: lips, mucosa, and tongue normal; teeth and gums normal Neck: no adenopathy, no carotid bruit, supple, symmetrical, trachea midline and thyroid not enlarged,  symmetric, no tenderness/mass/nodules Back: symmetric, no curvature. ROM normal. No CVA tenderness. Lungs: clear to auscultation bilaterally Heart: regular rate and rhythm, S1, S2 normal, no murmur, click, rub or gallop Abdomen: soft, non-tender; bowel sounds normal; no masses,  no organomegaly Pulses: 2+ and symmetric Skin: Skin color, texture, turgor normal. No rashes or lesions Lymph nodes: Cervical, supraclavicular, and axillary nodes normal.  Assessment and Plan: Occasional tremors    Conversion disorder with abnormal movement, persistent, with psychological stressor Psychiatry and pychology referrals advised.  Increasing paxil to 20 mg daily.  Agree that she would benefit from a minimum of 30 days of rest.  Home situation discussed at length and advice given.   Hypertension Adding Toprol XL   Pseudoseizures Reassured patient and  Mother that, based on ED physician's observations, the episodes she is having are not true seizures but amanifestation of her psychiatric decompensation.   Updated Medication List Outpatient Encounter Prescriptions as of 02/01/2014  Medication Sig  . acetaminophen (TYLENOL) 500 MG tablet Take 1,000 mg by mouth every 6 (six) hours as needed for mild pain.  Marland Kitchen PARoxetine (PAXIL) 20 MG tablet Take 1 tablet (20 mg total) by mouth at bedtime.  . [DISCONTINUED] PARoxetine (PAXIL) 10 MG tablet Take 10 mg by mouth at bedtime.  Marland Kitchen acyclovir (ZOVIRAX) 400 MG tablet Take 400 mg by mouth daily as needed (fever blisters).  . [DISCONTINUED] ALPRAZolam (XANAX) 0.5 MG tablet Take 1 tablet (0.5 mg total) by mouth at bedtime as needed for anxiety.  . [DISCONTINUED] ergocalciferol (DRISDOL) 50000 UNITS capsule Take 1 capsule (50,000 Units total) by mouth once a week.  . [DISCONTINUED] Magnesium Salicylate 299 MG TABS Take 600 mg by mouth daily.  . [DISCONTINUED] metoprolol succinate (TOPROL-XL) 25 MG 24 hr tablet Take 1 tablet (25 mg total) by mouth daily.

## 2014-02-03 ENCOUNTER — Encounter: Payer: Self-pay | Admitting: Internal Medicine

## 2014-02-03 DIAGNOSIS — Z0279 Encounter for issue of other medical certificate: Secondary | ICD-10-CM

## 2014-02-03 DIAGNOSIS — F444 Conversion disorder with motor symptom or deficit: Secondary | ICD-10-CM | POA: Insufficient documentation

## 2014-02-03 NOTE — Assessment & Plan Note (Addendum)
Psychiatry and pychology referrals advised.  Increasing paxil to 20 mg daily.  Agree that she would benefit from a minimum of 30 days of rest.  Home situation discussed at length and advice given.

## 2014-02-03 NOTE — Assessment & Plan Note (Signed)
Adding Toprol XL

## 2014-02-04 ENCOUNTER — Ambulatory Visit: Payer: Commercial Managed Care - PPO | Admitting: Internal Medicine

## 2014-02-05 ENCOUNTER — Encounter: Payer: Self-pay | Admitting: Internal Medicine

## 2014-02-05 ENCOUNTER — Telehealth: Payer: Self-pay | Admitting: Internal Medicine

## 2014-02-05 NOTE — Telephone Encounter (Signed)
Nelta Numbers Pt's boyfriend came by and dropped off blood pressure readings since started new medication. Placed in Dr. Demetrios Isaacs box.

## 2014-02-05 NOTE — Progress Notes (Deleted)
   Subjective:    Patient ID: Mary Bryant, female    DOB: [birth year], 48 y.o.   MRN: [id number]  HPI    Review of Systems     Objective:   Physical Exam        Assessment & Plan:

## 2014-02-05 NOTE — Progress Notes (Signed)
Patient FMLA paper work is complete and patient notified to pick up. Copy for scan and for billing made.

## 2014-02-06 NOTE — Telephone Encounter (Signed)
Patient started the metoprolol on Saturday 02/02/14 advised patient the need to wait for one week  X 7 days for adjustment.  Also advised patient of the pseudoseizures.

## 2014-02-06 NOTE — Telephone Encounter (Signed)
If she has not started the metoprolol, start it today.  Need to be on it x 7 days before dose is increased,  if indicated , so recheck BP one week.   She is having "pseudoseizures" (not real seizures  Per ER evaluation ) and these should resolve once her anxiety is under control.  If not we will send her to Neurology for evaluation

## 2014-02-06 NOTE — Telephone Encounter (Signed)
Patient Boyfriend called and stated patient had seizure around 2 am and that patient BP today is 158/115.

## 2014-02-06 NOTE — Telephone Encounter (Signed)
Placed in folder

## 2014-02-11 ENCOUNTER — Telehealth: Payer: Self-pay | Admitting: *Deleted

## 2014-02-11 MED ORDER — METOPROLOL SUCCINATE ER 25 MG PO TB24: 50.0000 mg | ORAL_TABLET | Freq: Every day | ORAL | Status: DC

## 2014-02-11 NOTE — Telephone Encounter (Signed)
Always ask the pulse reading as well. i cannot adjust medications for BP without knowing the pulse.  And please always clarify the medication,.  Get the patient to state the name of the medication  In case they are on more than one.   If her pulse is > 70,  She can increase the metoprolol to 50 mg daily

## 2014-02-11 NOTE — Telephone Encounter (Signed)
Pt called states her BP is still elevated with new BP medication.  States today at 1pm her BP was 157/111.  Please advise

## 2014-02-12 NOTE — Telephone Encounter (Signed)
Called pt again, left message on VM for pt to return call.

## 2014-02-12 NOTE — Telephone Encounter (Signed)
Left message for pt to return call.

## 2014-02-15 NOTE — Telephone Encounter (Signed)
Patient notified as long as pulse>70 to take 50 mg Metoprolol.

## 2014-02-20 ENCOUNTER — Ambulatory Visit: Payer: Commercial Managed Care - PPO | Admitting: General Surgery

## 2014-02-20 ENCOUNTER — Telehealth: Payer: Self-pay | Admitting: Internal Medicine

## 2014-02-20 DIAGNOSIS — F411 Generalized anxiety disorder: Secondary | ICD-10-CM

## 2014-02-20 NOTE — Telephone Encounter (Signed)
The patient is needing a referral put in the system for psychiatrist .

## 2014-02-20 NOTE — Telephone Encounter (Signed)
Referral is in process as requested 

## 2014-02-20 NOTE — Telephone Encounter (Signed)
Please advise 

## 2014-02-21 ENCOUNTER — Telehealth: Payer: Self-pay | Admitting: Internal Medicine

## 2014-02-21 NOTE — Telephone Encounter (Signed)
Pt mother called to find out about which psychiatry office the pt will see and the appt.

## 2014-02-21 NOTE — Telephone Encounter (Signed)
Mother called to find out if she can pick up a letter stating that pt's leave of absence has been extend. Pt still has not been able to see the psychiatrics, phycologist. Please advise pt mother. Mother stated that with the meds the pt is on she is unable to stay awake for more and 15 mins and doesn't remember information that is told to her during her visits.

## 2014-02-21 NOTE — Telephone Encounter (Signed)
Pt mother called back and became upset when she was told that Dr. Derrel Nip will not extend the [facility]. She stated that she has not missed any appts and the office failed to make an appt on 8/17. She stated that the patient has an appt with a counselor tomorrow and an appt scheduled with Dr. Derrel Nip on 9/4. Please advise pt mother

## 2014-02-21 NOTE — Telephone Encounter (Signed)
Patient has only been prescribed paxil!!! This excuse about not keeping appts with psychologist is not enough for me to extend her FMLA beyond one month, which is August 17th,  , especially since patient has not called or followed up with me.

## 2014-02-21 NOTE — Telephone Encounter (Signed)
Please advise see earlier message patient requesting extension on FMLA.

## 2014-02-21 NOTE — Telephone Encounter (Signed)
Patient left voicemail that she will need to be out of work until her appointment with psychiatry and would like a letter to her job for [employer] extension.

## 2014-02-22 NOTE — Telephone Encounter (Signed)
The letter I wrote on July 17th requested a minimum of 30 days, maximum of 90 . I cannot write for any more than that

## 2014-02-22 NOTE — Telephone Encounter (Addendum)
Talked with patient and advised patient letter written on 02/01/14 stated a minimum of 30 days and Max of 90, patient also stated that FMLA states 30 days only so for Extension she we need a letter stating to extend to the Max of 90 days patient, persisting our fault not able to see psychiatry before having to return to work and cannot get appointment to see MD until 03/22/14 please advise.

## 2014-02-24 NOTE — Telephone Encounter (Signed)
Letter written for 90 days of continuous absence of work

## 2014-02-25 NOTE — Telephone Encounter (Signed)
Letter printed awaiting signature. 

## 2014-02-25 NOTE — Telephone Encounter (Signed)
Patient ntoified letter ready for pick up and placed at front desk.

## 2014-02-26 ENCOUNTER — Ambulatory Visit (INDEPENDENT_AMBULATORY_CARE_PROVIDER_SITE_OTHER): Payer: Commercial Managed Care - PPO | Admitting: Adult Health

## 2014-02-26 ENCOUNTER — Encounter: Payer: Self-pay | Admitting: Adult Health

## 2014-02-26 VITALS — BP 107/73 | HR 70 | Temp 98.3°F | Resp 14 | Ht 68.0 in | Wt 188.5 lb

## 2014-02-26 DIAGNOSIS — R21 Rash and other nonspecific skin eruption: Secondary | ICD-10-CM

## 2014-02-26 DIAGNOSIS — R238 Other skin changes: Secondary | ICD-10-CM

## 2014-02-26 MED ORDER — ONDANSETRON HCL 4 MG PO TABS: 4.0000 mg | ORAL_TABLET | Freq: Three times a day (TID) | ORAL | Status: DC | PRN

## 2014-02-26 MED ORDER — VALACYCLOVIR HCL 1 G PO TABS: 1000.0000 mg | ORAL_TABLET | Freq: Three times a day (TID) | ORAL | Status: DC

## 2014-02-26 NOTE — Progress Notes (Signed)
Patient ID: [name], female   DOB: 07/30/1965, 48 y.o.   MRN: [id number]    Subjective:    Patient ID: Mary Bryant, female    DOB: 1965/11/26, 48 y.o.   MRN: [id number]  HPI  Pt is a pleasant 48 y/o female who presents with a painful rash on her left buttocks. She reports the rash developed approximately 2 days ago. Has not used anything on the rash. Denies sexual activity. She is feeling slightly nauseated.  Past Medical History  Diagnosis Date  . Hypertension     no prior treatment  . Insomnia     chronic, no prior sleep study  . Esophageal stricture     dilated 2009,  [place]  . rhinitis allergic     Current Outpatient Prescriptions on File Prior to Visit  Medication Sig Dispense Refill  . acetaminophen (TYLENOL) 500 MG tablet Take 1,000 mg by mouth every 6 (six) hours as needed for mild pain.      Marland Kitchen acyclovir (ZOVIRAX) 400 MG tablet Take 400 mg by mouth daily as needed (fever blisters).      . metoprolol succinate (TOPROL-XL) 25 MG 24 hr tablet Take 2 tablets (50 mg total) by mouth daily.  60 tablet  3  . PARoxetine (PAXIL) 20 MG tablet Take 1 tablet (20 mg total) by mouth at bedtime.  30 tablet  2   No current facility-administered medications on file prior to visit.     Review of Systems  Constitutional: Negative.   HENT: Negative.   Eyes: Negative.   Respiratory: Negative.   Cardiovascular: Negative.   Gastrointestinal: Positive for nausea. Negative for vomiting and abdominal pain.  Endocrine: Negative.   Genitourinary: Negative.   Musculoskeletal: Negative.   Skin: Positive for rash (on buttocks).  Allergic/Immunologic: Negative.   Neurological: Negative.   Hematological: Negative.   Psychiatric/Behavioral: Negative.        Objective:  BP 107/73  Pulse 70  Temp(Src) 98.3 F (36.8 C) (Oral)  Resp 14  Ht 5\' 8"  (1.727 m)  Wt 188 lb 8 oz (85.503 kg)  BMI 28.67 kg/m2  SpO2 96%   Physical Exam  Constitutional: She is oriented to person,  place, and time. No distress.  Cardiovascular: Normal rate and regular rhythm.   Pulmonary/Chest: Effort normal. No respiratory distress.  Musculoskeletal: Normal range of motion.  Neurological: She is alert and oriented to person, place, and time.  Skin: Rash noted. There is erythema.  Vesicular rash in cluster on left buttocks  Psychiatric: She has a normal mood and affect. Her behavior is normal. Judgment and thought content normal.      Assessment & Plan:   1. Vesicular rash Appears herpetic. She denies any outbreaks in the past. I am sending viral culture and bacterial culture of sample of vesicles. Start Valtrex 1000 mg tid x 7 days.  Note: greater than 30 min spent in face to face communication in obtaining history, examining patient, education regarding potential diagnoses and in obtaining viral and bacterial cultures from the area.

## 2014-02-26 NOTE — Patient Instructions (Addendum)
  I am sending a viral culture and a bacterial culture of your rash.  The results will take several days. Once I get the results we will notify you along with any further instructions.  Take valacyclovir 3 times a day for 7 days.  You can apply calamine lotion to soothe the area.  I am giving you a prescription for zofran for nausea. If nausea is not resolved within 1 week you need to call to let us know.

## 2014-02-26 NOTE — Progress Notes (Signed)
Pre visit review using our clinic review tool, if applicable. No additional management support is needed unless otherwise documented below in the visit note. 

## 2014-03-01 ENCOUNTER — Encounter: Payer: Self-pay | Admitting: Adult Health

## 2014-03-01 LAB — WOUND CULTURE
Gram Stain: NONE SEEN
Gram Stain: NONE SEEN
Gram Stain: NONE SEEN
Organism ID, Bacteria: NO GROWTH

## 2014-03-04 LAB — VIRAL CULTURE VIRC: Organism ID, Bacteria: DETECTED

## 2014-03-04 NOTE — Telephone Encounter (Signed)
Mailed unread message to pt  

## 2014-03-08 ENCOUNTER — Telehealth: Payer: Self-pay | Admitting: *Deleted

## 2014-03-08 NOTE — Telephone Encounter (Signed)
Pt called requesting status of disability forms, states [employer] has contacted [facility] for more information as to why pt needs 90 days continual absence.  Please advise

## 2014-03-10 ENCOUNTER — Telehealth: Payer: Self-pay | Admitting: Internal Medicine

## 2014-03-10 DIAGNOSIS — R569 Unspecified convulsions: Secondary | ICD-10-CM | POA: Insufficient documentation

## 2014-03-10 DIAGNOSIS — F445 Conversion disorder with seizures or convulsions: Secondary | ICD-10-CM | POA: Insufficient documentation

## 2014-03-10 NOTE — Assessment & Plan Note (Signed)
Reassured patient and  Mother that, based on ED physician's observations, the episodes she is having are not true seizures but amanifestation of her psychiatric decompensation.

## 2014-03-10 NOTE — Telephone Encounter (Signed)
"  FYI" the [employer] benefits short term disability form has been done.,  But needs Dr. Lianne Moris information added to page 3.  I have stated that ms Cheek has been advised to remain out of work until she has been evaluated by Dr Bridgett Larsson, and unless he says differently, that she will be able to return to work part time after seeing him.  I cannot and will not state that she has to be out completely for 90 days bc I do not have the expertise to make that call.  Since she has not called to make a follow up appt with me (as many other patients do who have anxiety that I am treating)  I have to assume that the medication I prescribed is helping (paroxetine)

## 2014-03-12 NOTE — Telephone Encounter (Signed)
Spoke with pt advised pt form is ready for pick up so she may take it to her appoint with Dr Bridgett Larsson for his completion.

## 2014-03-12 NOTE — Telephone Encounter (Signed)
Copies made for chart and Billing.

## 2014-03-15 DIAGNOSIS — Z0279 Encounter for issue of other medical certificate: Secondary | ICD-10-CM

## 2014-03-17 ENCOUNTER — Telehealth: Payer: Self-pay | Admitting: Internal Medicine

## 2014-03-17 ENCOUNTER — Emergency Department: Payer: Self-pay | Admitting: Internal Medicine

## 2014-03-17 DIAGNOSIS — F411 Generalized anxiety disorder: Secondary | ICD-10-CM

## 2014-03-17 LAB — COMPREHENSIVE METABOLIC PANEL
Albumin: 3.1 g/dL — ABNORMAL LOW (ref 3.4–5.0)
Alkaline Phosphatase: 82 U/L
Anion Gap: 7 (ref 7–16)
BUN: 16 mg/dL (ref 7–18)
Bilirubin,Total: 0.4 mg/dL (ref 0.2–1.0)
Calcium, Total: 8.1 mg/dL — ABNORMAL LOW (ref 8.5–10.1)
Chloride: 111 mmol/L — ABNORMAL HIGH (ref 98–107)
Co2: 26 mmol/L (ref 21–32)
Creatinine: 1.09 mg/dL (ref 0.60–1.30)
EGFR (African American): >60
EGFR (Non-African Amer.): >60
Glucose: 79 mg/dL (ref 65–99)
Osmolality: 287 (ref 275–301)
Potassium: 4.1 mmol/L (ref 3.5–5.1)
SGOT(AST): 36 U/L (ref 15–37)
SGPT (ALT): 29 U/L
Sodium: 144 mmol/L (ref 136–145)
Total Protein: 6.5 g/dL (ref 6.4–8.2)

## 2014-03-17 LAB — CBC WITH DIFFERENTIAL/PLATELET
Basophil #: 0.1 10*3/uL (ref 0.0–0.1)
Basophil %: 1 %
Eosinophil #: 0.2 10*3/uL (ref 0.0–0.7)
Eosinophil %: 3.5 %
HCT: 38.1 % (ref 35.0–47.0)
HGB: 12.9 g/dL (ref 12.0–16.0)
Lymphocyte #: 2.1 10*3/uL (ref 1.0–3.6)
Lymphocyte %: 33.3 %
MCH: 30 pg (ref 26.0–34.0)
MCHC: 33.9 g/dL (ref 32.0–36.0)
MCV: 89 fL (ref 80–100)
Monocyte #: 0.7 x10 3/mm (ref 0.2–0.9)
Monocyte %: 11.8 %
Neutrophil #: 3.2 10*3/uL (ref 1.4–6.5)
Neutrophil %: 50.4 %
Platelet: 237 10*3/uL (ref 150–440)
RBC: 4.31 10*6/uL (ref 3.80–5.20)
RDW: 14.5 % (ref 11.5–14.5)
WBC: 6.3 10*3/uL (ref 3.6–11.0)

## 2014-03-17 MED ORDER — ESCITALOPRAM OXALATE 20 MG PO TABS: 20.0000 mg | ORAL_TABLET | Freq: Every day | ORAL | Status: DC

## 2014-03-17 MED ORDER — CLONAZEPAM 0.5 MG PO TABS: 0.5000 mg | ORAL_TABLET | Freq: Two times a day (BID) | ORAL | Status: DC | PRN

## 2014-03-17 NOTE — Assessment & Plan Note (Addendum)
Patient has met with therapist Jama Flavors for 3rd weekly visit 03/14/14 (OV note received).  Recommended stopping paxil and  trial of lexapro and changing alprazolam to Klonopin , which I agree with .

## 2014-03-17 NOTE — Telephone Encounter (Signed)
Patient has met with therapist Jama Flavors for 3rd weekly visit 03/14/14 (OV note received).  Recommended stopping paxil and  trial of lexapro and changing alprazolam to Klonopin , which I agree with .  i have sent rx for lexapro to [facility] . She can simply stop the paxil and start the lexapro , no taper needed.  I will send the clonazepam which she can take once or twice daily as needed for anxiety  Make sure she has a follow up with me soon

## 2014-03-18 NOTE — Telephone Encounter (Signed)
Patient notified of medication changes and script faxed to [facility].

## 2014-03-22 ENCOUNTER — Ambulatory Visit (INDEPENDENT_AMBULATORY_CARE_PROVIDER_SITE_OTHER): Payer: Commercial Managed Care - PPO | Admitting: Internal Medicine

## 2014-03-22 VITALS — BP 112/88 | HR 75 | Temp 98.9°F | Resp 16 | Ht 68.0 in | Wt 191.2 lb

## 2014-03-22 DIAGNOSIS — G500 Trigeminal neuralgia: Secondary | ICD-10-CM

## 2014-03-22 DIAGNOSIS — F444 Conversion disorder with motor symptom or deficit: Secondary | ICD-10-CM

## 2014-03-22 DIAGNOSIS — F445 Conversion disorder with seizures or convulsions: Secondary | ICD-10-CM

## 2014-03-22 DIAGNOSIS — F449 Dissociative and conversion disorder, unspecified: Secondary | ICD-10-CM

## 2014-03-22 DIAGNOSIS — R569 Unspecified convulsions: Secondary | ICD-10-CM

## 2014-03-22 MED ORDER — CARBAMAZEPINE 100 MG PO CHEW: 100.0000 mg | CHEWABLE_TABLET | Freq: Three times a day (TID) | ORAL | Status: DC

## 2014-03-22 NOTE — Progress Notes (Signed)
Pre-visit discussion using our clinic review tool. No additional management support is needed unless otherwise documented below in the visit note.  

## 2014-03-22 NOTE — Patient Instructions (Addendum)
I am treating you for trigeminal neuralgia with a medication called carbamazapine.  Start with one dose twice daily and increase  To three times daily  If needed after 3 or 4 days.   The constipation is from the vicodin ,  Use ex lax,  Dulcolax, or sennakot s every night if you have taken vicodin that day.  I WANT YOU OUT OF BED BY 8 AM,  8:30 AT THE LATEST  IN BED BY 10 PM  NO CELL PHONES,  LAPTOPS OR TV AFTER 8 PM  GET OUTSIDE EVERY DAY AND GO FOR A WALK!!  Trigeminal Neuralgia Trigeminal neuralgia is a nerve disorder that causes sudden attacks of severe facial pain. It is caused by damage to the trigeminal nerve, a major nerve in the face. It is more common in women and in the elderly, although it can also happen in younger patients. Attacks last from a few seconds to several minutes and can occur from a couple of times per year to several times per day. Trigeminal neuralgia can be a very distressing and disabling condition. Surgery may be needed in very severe cases if medical treatment does not give relief. HOME CARE INSTRUCTIONS   If your caregiver prescribed medication to help prevent attacks, take as directed.  To help prevent attacks:  Chew on the unaffected side of the mouth.  Avoid touching your face.  Avoid blasts of hot or cold air.  Men may wish to grow a beard to avoid having to shave. SEEK IMMEDIATE MEDICAL CARE IF:  Pain is unbearable and your medicine does not help.  You develop new, unexplained symptoms (problems).  You have problems that may be related to a medication you are taking. Document Released: 07/02/2000 Document Revised: 09/27/2011 Document Reviewed: 05/02/2009 [facility] Patient Information 2015 [place], [place]. This information is not intended to replace advice given to you by your health care provider. Make sure you discuss any questions you have with your health care provider.

## 2014-03-22 NOTE — Progress Notes (Signed)
Patient ID: Mary Bryant, female   DOB: 1965-12-24, 48 y.o.   MRN: [id number]  Patient Active Problem List   Diagnosis Date Noted  . Trigeminal neuralgia of left side of face 03/22/2014  . 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 Conversion disorder with abnormal movement, persistent, with psychological stressor 02/03/2014  . Occasional tremors 01/07/2014  . Abnormal mammogram 08/08/2013  . Lump or mass in breast 08/08/2013  . CAP (community acquired pneumonia) 07/29/2013  . Hypovitaminosis D 06/10/2013  . Generalized anxiety disorder 06/08/2013  . Screening for breast cancer 06/08/2013  . CAD in native artery 06/08/2013  . H/O left bundle branch block 06/08/2013  . Overweight (BMI 25.0-29.9) 01/17/2012  . Insomnia   . Esophageal stricture     Subjective:  CC:   Chief Complaint  Patient presents with  . Follow-up    Pseuodo seizures    HPI:   Mary Bryant is a 48 y.o. female who presents for Follow up on generalized anxiety with conversion disorder and pseudoseizures. Patient was seen initially one month ago after a [place]  ER visit for activity determined to be non seizure activity. .  Started on clonazepam and paxil to 20 mg at Er follow up and advised to confront the home issues that were the primary causes of her emotional and physical stress.  Ha sbeen out of work since visit. And awaiting first psychiatric evaluation. Patient accompanied by adult daughter and mother, who is my patient [name])   Daughter and boyfriend have moved out of patient's home as advised and are no longer relying on patinet for financial support. Patient   Still sleeping over 12 hours daily,  Not doing much in the way of acitivity, but has had 2 or 3 sessions with counselor and medication changes have been tolerated.   On Sunday she Called 911 for development of  numbness tingling and facial pain involving left side of face.. ER physician did a CT scan which noted sinusitis and  She was   Treated with amoxcillin and tramadol.  Patinet switched herself to leftover vicodin  from prior surgery bc pai was severe,  No sinus symptoms. She saw her dentist for persistent pain in the left maxillary and mandibular area which she describes as excruciating, no dental source.found, ,  Pain is still present. Left side of face and neck.   Patient's mother and daughter both present,  Both adding details of numbness,  Syncope and unresponsiveness during ER visit, which was seemingly ignored by ER staff.  The environment in the room is very tense between the three women and the two women accompanying the patient appear to be more upset than the patient, who is calm today, per usual demeanor.     She is tolerating the change from paxil/alprazolam  to lexapro/clonazepam .  She is sleeping better , but is still having the panic episodes, some brought  on by shopping at [employer] that was accompanied by paranoia ("felt like someone was out to get me') .  Other epiosdes are not marked by paranoia,  just anxiety over finances.  She lost her job due to using up her FMLA .  Using the clonazepam twice daily    Past Medical History  Diagnosis Date  . Hypertension     no prior treatment  . Insomnia     chronic, no prior sleep study  . Esophageal stricture     dilated 2009,  elliott  . rhinitis allergic     Past  Surgical History  Procedure Laterality Date  . Cholecystectomy  2008  . Abdominal hysterectomy  2000    no history of ca        The following portions of the patient's history were reviewed and updated as appropriate: Allergies, current medications, and problem list.    Review of Systems:   Patient denies headache, fevers, malaise, unintentional weight loss, skin rash, eye pain, sinus congestion and sinus pain, sore throat, dysphagia,  hemoptysis , cough, dyspnea, wheezing, chest pain, palpitations, orthopnea, edema, abdominal pain, nausea, melena, diarrhea, constipation, flank pain, dysuria,  hematuria, urinary  Frequency, nocturia, numbness, tingling, seizures,  Focal weakness, Loss of consciousness,  Tremor, insomnia, depression, anxiety, and suicidal ideation.     History   Social History  . Marital Status: Single    Spouse Name: Jamilya Sarrazin    Number of Children: 2  . Years of Education: 14   Occupational History  .     Social History Main Topics  . Smoking status: Never Smoker   . Smokeless tobacco: Never Used  . Alcohol Use: No  . Drug Use: No  . Sexual Activity: Not Currently   Other Topics Concern  . 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   Has he  r CNA but doesnot use it.     Objective:  Filed Vitals:   03/22/14 1554  BP: 112/88  Pulse: 75  Temp: 98.9 F (37.2 C)  Resp: 16     General appearance: alert, cooperative and appears stated age Ears: normal TM's and external ear canals both ears Throat: lips, mucosa, and tongue normal; teeth and gums normal Neck: no adenopathy, no carotid bruit, supple, symmetrical, trachea midline and thyroid not enlarged, symmetric, no tenderness/mass/nodules Back: symmetric, no curvature. ROM normal. No CVA tenderness. Lungs: clear to auscultation bilaterally Heart: regular rate and rhythm, S1, S2 normal, no murmur, click, rub or gallop Abdomen: soft, non-tender; bowel sounds normal; no masses,  no organomegaly Pulses: 2+ and symmetric Skin: Skin color, texture, turgor normal. No rashes or lesions Lymph nodes: Cervical, supraclavicular, and axillary nodes normal. Neuro: CNs 2-12 intact. DTRs 2+/4 in biceps, brachioradialis, patellars and achilles. Muscle strength 5/5 in upper and lower exremities. Fine resting tremor bilaterally both hands cerebellar function normal. Romberg negative.  No pronator drift.   Gait normal.    Assessment and Plan:  Trigeminal neuralgia of left side  of face Starting tegretol for history suggestive of trigeminal neuralgia.   Pseudoseizures Secondary to GAD.   Conversion disorder with abnormal movement, persistent, with psychological stressor Awaiting pscyhiatric evaluation,  Continue lexapro and clonazepam.   A total of 40 minutes was spent with patient more than half of which was spent in counseling patient on the above mentioned issues , reviewing and explaining recent labs and imaging studies done, and coordination of care.   Updated Medication List Outpatient Encounter Prescriptions as of 03/22/2014  Medication Sig  . acyclovir (ZOVIRAX) 400 MG tablet Take 400 mg by mouth daily as needed (fever blisters).  Marland Kitchen amoxicillin-clavulanate (AUGMENTIN) 875-125 MG per tablet Take 1 tablet by mouth 2 (two) times daily.  . Cholecalciferol (VITAMIN D3) 1000 UNITS CAPS Take 1 capsule by mouth daily.  . clonazePAM (KLONOPIN) 0.5 MG tablet Take 1 tablet (0.5 mg total) by mouth 2 (two) times  daily as needed for anxiety.  . cyanocobalamin 1000 MCG tablet Take 100 mcg by mouth daily.  Marland Kitchen escitalopram (LEXAPRO) 20 MG tablet Take 1 tablet (20 mg total) by mouth daily.  Marland Kitchen HYDROcodone-acetaminophen (NORCO/VICODIN) 5-325 MG per tablet Take 1 tablet by mouth every 6 (six) hours as needed for moderate pain.  . Magnesium 250 MG TABS Take 1 tablet by mouth daily.  . metoprolol succinate (TOPROL-XL) 25 MG 24 hr tablet Take 2 tablets (50 mg total) by mouth daily.  Marland Kitchen acetaminophen (TYLENOL) 500 MG tablet Take 1,000 mg by mouth every 6 (six) hours as needed for mild pain.  . carbamazepine (TEGRETOL) 100 MG chewable tablet Chew 1 tablet (100 mg total) by mouth 3 (three) times daily.  . ondansetron (ZOFRAN) 4 MG tablet Take 1 tablet (4 mg total) by mouth every 8 (eight) hours as needed for nausea or vomiting.  . [DISCONTINUED] valACYclovir (VALTREX) 1000 MG tablet Take 1 tablet (1,000 mg total) by mouth 3 (three) times daily.     No orders of the defined types  were placed in this encounter.    Return in about 4 weeks (around 04/19/2014).

## 2014-03-25 NOTE — Assessment & Plan Note (Signed)
Awaiting pscyhiatric evaluation,  Continue lexapro and clonazepam.

## 2014-03-25 NOTE — Assessment & Plan Note (Signed)
Secondary to GAD.

## 2014-03-25 NOTE — Assessment & Plan Note (Signed)
Starting tegretol for history suggestive of trigeminal neuralgia.

## 2014-04-18 ENCOUNTER — Encounter: Payer: Self-pay | Admitting: *Deleted

## 2014-04-19 ENCOUNTER — Encounter: Payer: Self-pay | Admitting: Internal Medicine

## 2014-04-19 ENCOUNTER — Encounter: Payer: Self-pay | Admitting: *Deleted

## 2014-04-19 ENCOUNTER — Ambulatory Visit (INDEPENDENT_AMBULATORY_CARE_PROVIDER_SITE_OTHER): Payer: Commercial Managed Care - PPO | Admitting: Internal Medicine

## 2014-04-19 VITALS — BP 116/88 | HR 96 | Temp 98.2°F | Resp 14 | Ht 68.0 in | Wt 192.2 lb

## 2014-04-19 DIAGNOSIS — R635 Abnormal weight gain: Secondary | ICD-10-CM

## 2014-04-19 DIAGNOSIS — I10 Essential (primary) hypertension: Secondary | ICD-10-CM

## 2014-04-19 DIAGNOSIS — F445 Conversion disorder with seizures or convulsions: Secondary | ICD-10-CM

## 2014-04-19 DIAGNOSIS — Z8679 Personal history of other diseases of the circulatory system: Secondary | ICD-10-CM

## 2014-04-19 DIAGNOSIS — G500 Trigeminal neuralgia: Secondary | ICD-10-CM

## 2014-04-19 DIAGNOSIS — Z79899 Other long term (current) drug therapy: Secondary | ICD-10-CM

## 2014-04-19 DIAGNOSIS — R569 Unspecified convulsions: Secondary | ICD-10-CM

## 2014-04-19 DIAGNOSIS — F444 Conversion disorder with motor symptom or deficit: Secondary | ICD-10-CM

## 2014-04-19 DIAGNOSIS — T50905A Adverse effect of unspecified drugs, medicaments and biological substances, initial encounter: Secondary | ICD-10-CM

## 2014-04-19 LAB — COMPREHENSIVE METABOLIC PANEL
ALT: 133 U/L — AB (ref 0–35)
AST: 111 U/L — ABNORMAL HIGH (ref 0–37)
Albumin: 3.9 g/dL (ref 3.5–5.2)
Alkaline Phosphatase: 145 U/L — ABNORMAL HIGH (ref 39–117)
BUN: 9 mg/dL (ref 6–23)
CALCIUM: 9.8 mg/dL (ref 8.4–10.5)
CHLORIDE: 102 meq/L (ref 96–112)
CO2: 29 mEq/L (ref 19–32)
Creatinine, Ser: 0.8 mg/dL (ref 0.4–1.2)
GFR: 77.32 mL/min (ref >60.00)
Glucose, Bld: 81 mg/dL (ref 70–99)
Potassium: 4.3 mEq/L (ref 3.5–5.1)
SODIUM: 138 meq/L (ref 135–145)
Total Bilirubin: 0.6 mg/dL (ref 0.2–1.2)
Total Protein: 7.7 g/dL (ref 6.0–8.3)

## 2014-04-19 NOTE — Patient Instructions (Signed)
I will adjust your tegretol dose pending evaluation of your tegretol level   If Dr Bridgett Larsson starts prazosin,  You will need to reduce your metoprolol to 1/2 tablet for a week,  Then stop it completely  I will see you again in 4 weeks to recheck your blood pressure

## 2014-04-19 NOTE — Progress Notes (Addendum)
Patient ID: Mary Bryant, female   DOB: [birth year], 48 y.o.   MRN: [id number]   Patient Active Problem List   Diagnosis Date Noted  . Weight gain due to medication 04/21/2014  . Long-term use of high-risk medication 04/21/2014  . Trigeminal neuralgia of left side of face 03/22/2014  . 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 Conversion disorder with abnormal movement, persistent, with psychological stressor 02/03/2014  . Occasional tremors 01/07/2014  . Abnormal mammogram 08/08/2013  . Lump or mass in breast 08/08/2013  . Hypovitaminosis D 06/10/2013  . Generalized anxiety disorder 06/08/2013  . Screening for breast cancer 06/08/2013  . CAD in native artery 06/08/2013  . H/O left bundle branch block 06/08/2013  . Overweight (BMI 25.0-29.9) 01/17/2012  . Insomnia   . Esophageal stricture     Subjective:  CC:   Chief Complaint  Patient presents with  . Follow-up    4 week Trigeminal neuralgia of left side face. Pseudoseizures    HPI:   Mary Bryant is a 48 y.o. female who presents for Follow up on GAD with conversion disorder and pseudoseziures, and recent development of trigeminal neuralgia .  Patient is accompanied by boyfriend today.  She has had an evaluation by Psychiatry and Dr. Bridgett Larsson has   Continued the lexapro and clonazepam and requested EJG and tegretol level with plans to increase the tegretol doe if indicated.  Patient states that the tegeretol is helping the TN but she has recurrent pain when the medicatio wears off.  Her next appt with Bridgett Larsson is the 16th and he is consderring adding prazosin for nightmares, which may require reduction or suspension of metoprolol to prevent hypotension .   She has not seen Charlies Constable in a few weeks due to financial limitations.  Apparently her short term disability insurance has refused to pay her for the time she has been off work (3 months now)because [employer] will not release her medical records to [employer] until pays the copying charge  so [employer] is still waiting for the records.  This has been addressed today; patient has been given copies of all office visit notes since June..  No new issues..  Patient has gained weight and requesting a medication to help with weight loss and lack of energy,  Patient is not exercising daily as advised a month ago .  She has no physical barrier,  She is sleeping well and has no complaints of knee, hip or back pain.     Past Medical History  Diagnosis Date  . Hypertension     no prior treatment  . Insomnia     chronic, no prior sleep study  . Esophageal stricture     dilated 2009,  [place]  . rhinitis allergic     Past Surgical History  Procedure Laterality Date  . Cholecystectomy  2008  . Abdominal hysterectomy  2000    no history of ca        The following portions of the patient's history were reviewed and updated as appropriate: Allergies, current medications, and problem list.    Review of Systems:   Patient denies headache, fevers, malaise, unintentional weight loss, skin rash, eye pain, sinus congestion and sinus pain, sore throat, dysphagia,  hemoptysis , cough, dyspnea, wheezing, chest pain, palpitations, orthopnea, edema, abdominal pain, nausea, melena, diarrhea, constipation, flank pain, dysuria, hematuria, urinary  Frequency, nocturia, numbness, tingling, seizures,  Focal weakness, Loss of consciousness,  Tremor, insomnia, depression, anxiety, and suicidal ideation.     History  Social History  . Marital Status: Single    Spouse Name: Mary Bryant    Number of Children: 2  . Years of Education: 14   Occupational History  .     Social History Main Topics  . Smoking status: Never Smoker   . Smokeless tobacco: Never Used  . Alcohol Use: No  . Drug Use: No  . Sexual Activity: Not Currently   Other Topics Concern  . 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  Has addressed family stressors and currently on leave to address psychologic issues   Has her CNA but does not use it.     Objective:  Filed Vitals:   04/19/14 0936  BP: 116/88  Pulse: 96  Temp: 98.2 F (36.8 C)  Resp: 14     General appearance: alert, cooperative and appears stated age Ears: normal TM's and external ear canals both ears Throat: lips, mucosa, and tongue normal; teeth and gums normal Neck: no adenopathy, no carotid bruit, supple, symmetrical, trachea midline and thyroid not enlarged, symmetric, no tenderness/mass/nodules Back: symmetric, no curvature. ROM normal. No CVA tenderness. Lungs: clear to auscultation bilaterally Heart: regular rate and rhythm, S1, S2 normal, no murmur, click, rub or gallop Abdomen: soft, non-tender; bowel sounds normal; no masses,  no organomegaly Pulses: 2+ and symmetric Skin: Skin color, texture, turgor normal. No rashes or lesions Lymph nodes: Cervical, supraclavicular, and axillary nodes normal.  Assessment and Plan:  Hypertension Well controlled on current regimen of Metoprolol succinate 50 mg daily . Discussed how to taper off of metoprolol if Dr Bridgett Larsson prescribes prazosin.   Pseudoseizures No recurrent episodes in last month  Conversion disorder with abnormal movement, persistent, with psychological stressor Continue lexapro, clonazepam, psychiatry and psychology follow up.  Dr Bridgett Larsson may add prazosin for nightmares.   H/O left bundle branch block EKG was repeated today and unchanged from prior.   Trigeminal neuralgia of left side of face Managed with tegtretol 100 mg tid,  Level is low end of therapuetic at 5.0  ,  Will increase dose to 200 mg tid as advised by Dr. Bridgett Larsson , EKG was done and LBBB is chronic.   Weight gain due to medication She has gained 12 lbs since June.  I have addressed weight gain and sedentary lifestyle and strongly advised a natural nonpharmacologic approach to weight  loss  using a low glycemic index diet and regular exercise a minimum of 5 days per week.    Long-term use of high-risk medication Asymptomatic AST and ALT elevation is noted on today's labs which can occur with tegretol use  .  Will repeat with CK and CBC in one week.    A total of 40 minutes was spent with patient more than half of which was spent in counseling patient on the above mentioned issues , reviewing and explaining recent labs and imaging studies done, and coordination of care.  Updated Medication List Outpatient Encounter Prescriptions as of 04/19/2014  Medication Sig  . acetaminophen (TYLENOL) 500 MG tablet Take 1,000 mg by mouth every 6 (six) hours as needed for mild pain.  Marland Kitchen acyclovir (ZOVIRAX) 400 MG tablet Take 400 mg by mouth daily as needed (fever blisters).  . Cholecalciferol (VITAMIN D3) 1000 UNITS CAPS Take 1 capsule by mouth daily.  . clonazePAM (KLONOPIN) 0.5 MG tablet Take  1 tablet (0.5 mg total) by mouth 2 (two) times daily as needed for anxiety.  . cyanocobalamin 1000 MCG tablet Take 100 mcg by mouth daily.  Marland Kitchen escitalopram (LEXAPRO) 20 MG tablet Take 1 tablet (20 mg total) by mouth daily.  . Magnesium 250 MG TABS Take 1 tablet by mouth daily.  . metoprolol succinate (TOPROL-XL) 25 MG 24 hr tablet Take 2 tablets (50 mg total) by mouth daily.  . [DISCONTINUED] carbamazepine (TEGRETOL) 100 MG chewable tablet Chew 1 tablet (100 mg total) by mouth 3 (three) times daily.  . carbamazepine (TEGRETOL) 200 MG tablet Take 1 tablet (200 mg total) by mouth 3 (three) times daily.  . ondansetron (ZOFRAN) 4 MG tablet Take 1 tablet (4 mg total) by mouth every 8 (eight) hours as needed for nausea or vomiting.  . [DISCONTINUED] amoxicillin-clavulanate (AUGMENTIN) 875-125 MG per tablet Take 1 tablet by mouth 2 (two) times daily.  . [DISCONTINUED] HYDROcodone-acetaminophen (NORCO/VICODIN) 5-325 MG per tablet Take 1 tablet by mouth every 6 (six) hours as needed for moderate pain.      Orders Placed This Encounter  Procedures  . Carbamazepine Level (Tegretol), total  . Comp Met (CMET)  . Hepatic function panel  . CK  . CBC with Differential  . Basic metabolic panel  . Lipid panel  . EKG 12-Lead    Return in about 4 weeks (around 05/17/2014).

## 2014-04-20 LAB — CARBAMAZEPINE LEVEL, TOTAL: CARBAMAZEPINE LVL: 5 ug/mL (ref 4.0–12.0)

## 2014-04-21 ENCOUNTER — Encounter: Payer: Self-pay | Admitting: Internal Medicine

## 2014-04-21 ENCOUNTER — Telehealth: Payer: Self-pay | Admitting: Internal Medicine

## 2014-04-21 DIAGNOSIS — Z79899 Other long term (current) drug therapy: Secondary | ICD-10-CM | POA: Insufficient documentation

## 2014-04-21 DIAGNOSIS — T50905A Adverse effect of unspecified drugs, medicaments and biological substances, initial encounter: Secondary | ICD-10-CM

## 2014-04-21 DIAGNOSIS — R635 Abnormal weight gain: Secondary | ICD-10-CM | POA: Insufficient documentation

## 2014-04-21 MED ORDER — CARBAMAZEPINE 200 MG PO TABS: 200.0000 mg | ORAL_TABLET | Freq: Three times a day (TID) | ORAL | Status: DC

## 2014-04-21 NOTE — Assessment & Plan Note (Signed)
Managed with tegtretol 100 mg tid,  Level is low end of therapuetic at 5.0  ,  Will increase dose to 200 mg tid as advised by Dr. Bridgett Larsson , EKG was done and LBBB is chronic.

## 2014-04-21 NOTE — Telephone Encounter (Signed)
Please send copy of labs and office note to Ulis Rias, Psychiatry at [facility]. Confirm that patient received e nail to return for additional fasting labs this week

## 2014-04-21 NOTE — Addendum Note (Signed)
Addended by: Crecencio Mc on: 04/21/2014 07:54 AM   Modules accepted: Orders

## 2014-04-21 NOTE — Assessment & Plan Note (Signed)
Well controlled on current regimen of Metoprolol succinate 50 mg daily . Discussed how to taper off of metoprolol if Dr Bridgett Larsson prescribes prazosin.

## 2014-04-21 NOTE — Assessment & Plan Note (Signed)
She has gained 12 lbs since June.  I have addressed weight gain and sedentary lifestyle and strongly advised a natural nonpharmacologic approach to weight loss  using a low glycemic index diet and regular exercise a minimum of 5 days per week.

## 2014-04-21 NOTE — Assessment & Plan Note (Signed)
Asymptomatic AST and ALT elevation is noted on today's labs which can occur with tegretol use  .  Will repeat with CK and CBC in one week.

## 2014-04-21 NOTE — Assessment & Plan Note (Signed)
EKG was repeated today and unchanged from prior.

## 2014-04-21 NOTE — Assessment & Plan Note (Signed)
No recurrent episodes in last month

## 2014-04-21 NOTE — Assessment & Plan Note (Signed)
Continue lexapro, clonazepam, psychiatry and psychology follow up.  Dr Bridgett Larsson may add prazosin for nightmares.

## 2014-04-22 NOTE — Telephone Encounter (Addendum)
Patient advised of My chart message and lab scheduled. FYI Also Labs and notes faxed to Dr. Bridgett Larsson

## 2014-04-24 ENCOUNTER — Other Ambulatory Visit (INDEPENDENT_AMBULATORY_CARE_PROVIDER_SITE_OTHER): Payer: Commercial Managed Care - PPO

## 2014-04-24 DIAGNOSIS — R748 Abnormal levels of other serum enzymes: Secondary | ICD-10-CM

## 2014-04-24 DIAGNOSIS — Z79899 Other long term (current) drug therapy: Secondary | ICD-10-CM

## 2014-04-24 LAB — CBC WITH DIFFERENTIAL/PLATELET
BASOS ABS: 0.1 10*3/uL (ref 0.0–0.1)
Basophils Relative: 1 % (ref 0.0–3.0)
EOS ABS: 0.3 10*3/uL (ref 0.0–0.7)
Eosinophils Relative: 4.5 % (ref 0.0–5.0)
HCT: 41.5 % (ref 36.0–46.0)
Hemoglobin: 13.6 g/dL (ref 12.0–15.0)
LYMPHS PCT: 20.6 % (ref 12.0–46.0)
Lymphs Abs: 1.4 10*3/uL (ref 0.7–4.0)
MCHC: 32.7 g/dL (ref 30.0–36.0)
MCV: 88.8 fl (ref 78.0–100.0)
Monocytes Absolute: 0.6 10*3/uL (ref 0.1–1.0)
Monocytes Relative: 8.9 % (ref 3.0–12.0)
NEUTROS PCT: 65 % (ref 43.0–77.0)
Neutro Abs: 4.5 10*3/uL (ref 1.4–7.7)
Platelets: 286 10*3/uL (ref 150.0–400.0)
RBC: 4.68 Mil/uL (ref 3.87–5.11)
RDW: 14.6 % (ref 11.5–15.5)
WBC: 7 10*3/uL (ref 4.0–10.5)

## 2014-04-24 LAB — LIPID PANEL
CHOLESTEROL: 187 mg/dL (ref 0–200)
HDL: 48 mg/dL (ref >39.00)
LDL CALC: 116 mg/dL — AB (ref 0–99)
NonHDL: 139
TRIGLYCERIDES: 117 mg/dL (ref 0.0–149.0)
Total CHOL/HDL Ratio: 4
VLDL: 23.4 mg/dL (ref 0.0–40.0)

## 2014-04-24 LAB — BASIC METABOLIC PANEL
BUN: 10 mg/dL (ref 6–23)
CHLORIDE: 105 meq/L (ref 96–112)
CO2: 25 mEq/L (ref 19–32)
Calcium: 9.6 mg/dL (ref 8.4–10.5)
Creatinine, Ser: 0.9 mg/dL (ref 0.4–1.2)
GFR: 75.25 mL/min (ref >60.00)
Glucose, Bld: 83 mg/dL (ref 70–99)
POTASSIUM: 4.3 meq/L (ref 3.5–5.1)
SODIUM: 140 meq/L (ref 135–145)

## 2014-04-24 LAB — HEPATIC FUNCTION PANEL
ALBUMIN: 3.5 g/dL (ref 3.5–5.2)
ALT: 60 U/L — ABNORMAL HIGH (ref 0–35)
AST: 32 U/L (ref 0–37)
Alkaline Phosphatase: 145 U/L — ABNORMAL HIGH (ref 39–117)
Bilirubin, Direct: 0.1 mg/dL (ref 0.0–0.3)
TOTAL PROTEIN: 7.5 g/dL (ref 6.0–8.3)
Total Bilirubin: 0.6 mg/dL (ref 0.2–1.2)

## 2014-04-24 LAB — CK: Total CK: 70 U/L (ref 7–177)

## 2014-04-25 ENCOUNTER — Encounter: Payer: Self-pay | Admitting: Internal Medicine

## 2014-04-25 NOTE — Addendum Note (Signed)
Addended by: Crecencio Mc on: 04/25/2014 06:33 AM   Modules accepted: Orders

## 2014-04-29 ENCOUNTER — Ambulatory Visit: Payer: Self-pay | Admitting: Internal Medicine

## 2014-04-30 ENCOUNTER — Telehealth: Payer: Self-pay | Admitting: Internal Medicine

## 2014-04-30 NOTE — Telephone Encounter (Signed)
Her abdominal ultrasound was normal.  Everything looked fine

## 2014-04-30 NOTE — Telephone Encounter (Signed)
Sent my chart with results. 

## 2014-05-01 ENCOUNTER — Other Ambulatory Visit: Payer: Self-pay | Admitting: Internal Medicine

## 2014-05-02 ENCOUNTER — Other Ambulatory Visit: Payer: Self-pay | Admitting: *Deleted

## 2014-05-03 ENCOUNTER — Other Ambulatory Visit: Payer: Self-pay

## 2014-05-09 ENCOUNTER — Encounter: Payer: Self-pay | Admitting: Internal Medicine

## 2014-05-14 ENCOUNTER — Encounter: Payer: Self-pay | Admitting: Internal Medicine

## 2014-05-20 ENCOUNTER — Encounter: Payer: Self-pay | Admitting: Internal Medicine

## 2014-05-31 ENCOUNTER — Telehealth: Payer: Self-pay

## 2014-05-31 NOTE — Telephone Encounter (Signed)
Patient voiced understanding by repetition of directions.

## 2014-05-31 NOTE — Telephone Encounter (Signed)
Please advise 

## 2014-05-31 NOTE — Telephone Encounter (Signed)
The patient called and mentioned that Dr.Wagnoer wanted her to stop the klonopin and start on lexapro.  She was hoping to run this by Dr.Tullo and have this medication updated.   Thanks!

## 2014-05-31 NOTE — Telephone Encounter (Signed)
Yes, I agree.  If she has been taking the Klonipin twice daily every day she will need to reduce it to once daily for a week,, then stop it completely

## 2014-07-17 ENCOUNTER — Other Ambulatory Visit: Payer: Self-pay | Admitting: Internal Medicine

## 2014-07-17 MED ORDER — ESCITALOPRAM OXALATE 20 MG PO TABS: 20.0000 mg | ORAL_TABLET | Freq: Every day | ORAL | Status: DC

## 2014-07-17 NOTE — Telephone Encounter (Signed)
Patient needs refill on Lexapro  May refill?

## 2014-07-17 NOTE — Telephone Encounter (Signed)
Ok to refill,  Refill sent  

## 2014-09-05 ENCOUNTER — Other Ambulatory Visit: Payer: Self-pay | Admitting: Internal Medicine

## 2014-09-09 ENCOUNTER — Other Ambulatory Visit: Payer: Self-pay | Admitting: Internal Medicine

## 2014-09-22 ENCOUNTER — Emergency Department: Payer: Self-pay | Admitting: Emergency Medicine

## 2014-10-20 ENCOUNTER — Other Ambulatory Visit: Payer: Self-pay | Admitting: Internal Medicine

## 2014-11-08 NOTE — Consult Note (Signed)
Brief Consult Note: Diagnosis: Adjustment disorder with anxiety.   Patient was seen by consultant.   Consult note dictated.   Recommend further assessment or treatment.   Discussed with Attending MD.   Comments: Mary Bryant has no psychiatric past. She has been under considerable stress lately from working 23 hours a day and family problems. She developed CP on Monday and is still under investigation. She develiped sx suggestive of pseudoseizures with negative EEG and neurology consultation pending. She denies suicidal or homicidal ideation. She is not interested in pharmacotherapy. She took steps to decrease work load already. She has supportive parents.   PLAN: 1. The patient dose not meet criteria for IVC. PLease discharge as appropriate.   2. No medications recommended. The patient is naive to benzodiazepines I would not recommned it now unless by neurologist.   3. She agrees to psychotherapy. There are limitted resources. She will be best served by [facility]. Information provided.   4. In case of emergency, she is to go to Crisis center at [facility]. Info provided.   5. I will sign off.  Electronic Signatures: Orson Slick (MD)  (Signed 19-Sep-14 13:59)  Authored: Brief Consult Note   Last Updated: 19-Sep-14 13:59 by Orson Slick (MD)

## 2014-11-08 NOTE — Consult Note (Signed)
Referring Physician:  Monica Becton :   Primary Care Physician:  Jacquenette Shone, [address], [place], [place] [address], [phone]  Reason for Consult: Admit Date: 04-Apr-2013  Chief Complaint: Chest pain, headache.  Reason for Consult: headache   History of Present Illness: History of Present Illness:   CHE:NIDP old woman with migraines and GERD presents with chest pain and has since developed worsening shaking spells.  Neuro is asked to eval the shaking spells.  They started over the past few weeks and seem to be in the entire body.  No altered mental status.  No clear pattern of limbs, tends to be random, sometimes in one limb, sometimes in all four, sometimes in the head, etc.  Low amplitude, high frequency shaking.  Initially was occurring once every few hours, recently has become once every 15 minutes or so and at other times is seemingly constant for long stretches.  Nothing clearly precipitates it or causes it.  Nothing in particular makes it better though the benzo given to her yesterday apparently reduced teh symptoms according to her boyfriend today.  NO confusion after the events.  No tongue biting or incontinence.  No history of seizures in patient or family.  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  Also, boyfriend says that the weekend prior to the start of these symptoms, ie 4-5 days ago, they were arguing a lot more than usual and he thinks she is under an extremely high level of stress right now.   MEDICAL HISTORY:  Gastroesophageal reflux disease.  Migraine headaches.  SURGICAL HISTORY: Cholecystectomy.  Partial hysterectomy.   FAMILY HISTORY:  For mother, strong family history of diabetes mellitus, hypertension, multiple family members with MI.  Brother with cluster headaches.  No FH of seizures.  SOCIAL HISTORY:   No history of smoking, drinking alcohol or using illicit drugs.  Lives with her fiance.   MEDICATIONS: Valium 5 mg 4 times a day as needed.  Tramadol 50 mg 4 times a day.  Omeprazole 20 mg once a day.   ALLERGIES:  BENADRYL     ROS:  General denies complaints   HEENT no complaints   Lungs no complaints   Cardiac no complaints   GI no complaints   GU no complaints   Musculoskeletal no complaints   Extremities no complaints   Skin no complaints   Endocrine no complaints   Past Medical/Surgical Hx:  Reflux:   Migraines:   Cholecystectomy:   Hysterectomy - Partial:   Home Medications: Medication Instructions Last Modified Date/Time  Valium 5 mg tablet 1 tab(s) orally 4 times a day x 7 days- as needed  17-Sep-14 20:12  omeprazole 20 mg oral delayed release tablet 1 tab(s) orally once a day 17-Sep-14 20:12  traMADol 50 mg oral tablet 1 tab(s) orally every 4 hours, As Needed 17-Sep-14 20:12   [facility] Current Meds:  Enoxaparin injection, ( Lovenox injection )  40 mg, Subcutaneous, daily  Indication: Prophylaxis or treatment of thromboembolic disorders, Monitor Anticoags per hospital protocol  Ondansetron injection, ( Zofran injection )  4 mg, IV push, q4h PRN for Nausea/Vomiting  Indication: Nausea/ Vomiting  traMADol tablet, ( Ultram)  50 mg Oral q4h PRN for pain  - Indication: Pain  Omeprazole capsule, ( PriLOSEC)  20 mg Oral ac/break  - Indication: GERD  Instructions:  DO NOT CRUSH  Nursing Saline Flush, 3 to 6 ml, IV push, Q1M PRN for IV Maintenance  Allergies:  Benadryl: Itching, Hives  Phenergan: Unknown  Vital Signs: **Vital Signs.:   18-Sep-14 07:47  Vital Signs Type Routine  Temperature Temperature (F) 97.7  Celsius 36.5  Temperature Source oral  Pulse Pulse 68  Respirations Respirations 18  Systolic BP Systolic BP 254  Diastolic BP (mmHg) Diastolic BP (mmHg) 70  Mean BP 83  Pulse Ox % Pulse Ox % 98  Pulse Ox Activity Level  At rest   Oxygen Delivery Room Air/ 21 %   EXAM: PHYSICAL EXAMINATION  GENERAL: Pleasant.  NAD.  Normocephalic and atraumatic.  EYES: Funduscopic exam shows normal disc size, appearance and C/D ratio without clear evidence of papilledema.  CARDIOVASCULAR: S1 and S2 sounds are within normal limits, without murmurs, gallops, or rubs.  MUSCULOSKELETAL: Random low amp, high frequency shaking, irregular, reduces with distraction, seems migratory, in arm for awhile, then other arm, then head, etc.  No confusion, AMS, or other neurologic problems identified during the spells, spells occurring nearly constantly for the first half of our visit, then about 25% of the time during the second half of the visit. Bulk - Normal Tone - Normal Pronator Drift - Absent bilaterally. Ambulation - Gait and station are within normal limits. Romberg - Absent  R/L 5/5    Shoulder abduction (deltoid/supraspinatus, axillary/suprascapular n, C5) 5/5    Elbow flexion (biceps brachii, musculoskeletal n, C5-6) 5/5    Elbow extension (triceps, radial n, C7) 5/5    Finger adduction (interossei, ulnar n, T1)   5/5    Hip flexion (iliopsoas, L1/L2) 5/5    Knee flexion (hamstrings, sciatic n, L5/S1) 5/5    Knee extension (quadriceps, femoral n, L3/4) 5/5    Ankle dorsiflexion (tibialis anterior, deep fibular n, L4/5) 5/5    Ankle plantarflexion (gastroc, tibial n, S1)  NEUROLOGICAL: MENTAL STATUS: Patient is oriented to person, place and time.  Recent and remote memory are intact.  Attention span and concentration are intact.  Naming, repetition, comprehension and expressive speech are within normal limits.  Patient's fund of knowledge is within normal limits for educational level.  CRANIAL NERVES: Normal    CN II (normal visual acuity and visual fields) Normal    CN III, IV, VI (extraocular muscles are intact) Normal    CN V (facial sensation is intact bilaterally) Normal    CN VII (facial strength is intact  bilaterally) Normal    CN VIII (hearing is intact bilaterally) Normal    CN IX/X (palate elevates midline, normal phonation) Normal    CN XI (shoulder shrug strength is normal and symmetric) Normal    CN XII (tongue protrudes midline)   SENSATION: Intact to pain and temp bilaterally (spinothalamic tracts) Intact to position and vibration bilaterally (dorsal columns)   REFLEXES: R/L 2+/2+    Biceps 2+/2+    Brachioradialis   2+/2+    Patellar 2+/2+    Achilles   COORDINATION/CEREBELLAR: Finger to nose testing is within normal limits.  Lab Results:  Cardiology:  17-Sep-14 15:44   Ventricular Rate 88  Atrial Rate 88  P-R Interval 150  QRS Duration 134  QT 394  QTc 476  P Axis 58  R Axis -2  T Axis 100  ECG interpretation Normal sinus rhythm Possible Left atrial enlargement Left bundle branch block Abnormal ECG When compared with ECG of 04-Apr-2013 09:50, No significant change was found ----------unconfirmed---------- Confirmed  by OVERREAD, NOT ([id number]), editor PEARSON, BARBARA ([id number]) on 04/05/2013 1:02:50 PM  Routine Chem:  17-Sep-14 15:47   Glucose, Serum  111  BUN 10  Creatinine (comp) 0.98  Sodium, Serum 140  Potassium, Serum 3.8  Chloride, Serum  108  CO2, Serum 27  Calcium (Total), Serum 9.5  Anion Gap  5  Osmolality (calc) 279  eGFR (African American) >60  eGFR (Non-African American) >60 (eGFR values <85mL/min/1.73 m2 may be an indication of chronic kidney disease (CKD). Calculated eGFR is useful in patients with stable renal function. The eGFR calculation will not be reliable in acutely ill patients when serum creatinine is changing rapidly. It is not useful in  patients on dialysis. The eGFR calculation may not be applicable to patients at the low and high extremes of body sizes, pregnant women, and vegetarians.)  Cardiac:  18-Sep-14 08:04   CK, Total 85  CPK-MB, Serum 0.5 (Result(s) reported on 05 Apr 2013 at 09:03AM.)  Troponin I < 0.02  (0.00-0.05 0.05 ng/mL or less: NEGATIVE  Repeat testing in 3-6 hrs  if clinically indicated. >0.05 ng/mL: POTENTIAL  MYOCARDIAL INJURY. Repeat  testing in 3-6 hrs if  clinically indicated. NOTE: An increase or decrease  of 30% or more on serial  testing suggests a  clinically important change)  Routine Hem:  17-Sep-14 15:47   WBC (CBC) 10.4  RBC (CBC) 4.89  Hemoglobin (CBC) 14.4  Hematocrit (CBC) 42.2  Platelet Count (CBC) 297 (Result(s) reported on 04 Apr 2013 at 04:08PM.)  MCV 86  MCH 29.4  MCHC 34.1  RDW 13.7   Radiology Results: CT:    17-Sep-14 16:51, CT Head Without Contrast  CT Head Without Contrast   REASON FOR EXAM:    headache  COMMENTS:   LMP: Post Hysterectomy    PROCEDURE: CT  - CT HEAD WITHOUT CONTRAST  - Apr 04 2013  4:51PM     RESULT: Noncontrast CT of the brain is performed. The patient has no   previous exam for comparison.    The ventricles and sulci are normal. There is no hemorrhage. There is no   focal mass, mass-effect or midline shift. There is no evidence of edema   or territorial infarct. The bone windows demonstrate normal aeration of   the paranasal sinuses and mastoid aircells. There is no skull fracture   demonstrated.    IMPRESSION:    1. No acute intracranial abnormality.    Dictation Site: 6        Verified By: Sundra Aland, M.D., MD   Impression/Recommendations: Recommendations:   49 year old woman with migrains and GERD presents with chest pain and has since developed worsening shaking spells.  sweet woman appears to be suffering from a conversion disorder manifested as shaking spells.  Would rec a routine EEG to rule out seizures, but I think seizures are very unlikely overall given no other seizure like symptoms besides migratory and distractable, predominantly upper extremity and head shaking.  Per boyfriend/fiancee, patient under high levels of stress recently due to lost job and increased fighting/disagreements in the  household particularly ov erthe weekend leading up to her chest pain and shaking spells.  Discussed at length with patient and family the likely diagnosis of conversion disorder.  Besides the shaking, her exam is nonfocal which is reassuring.  No need for seizure medication, likely will resolve spontaneously in a few days.  Provided a lot of encouragement and support, I feel she is under a lot of stress  lately and will improve gradually with time back to her normal level of function.  HCT personally viewed and is unremarkable.  EKG shows NSR.  and coordinated care with Dr. Saralyn Pilar (cardiology) today. have reviewed the results of the most recent imaging studies, tests and labs as outlined above and answered all related questions.     Electronic Signatures: Anabel Bene (MD)  (Signed 20-Sep-14 03:08)  Authored: REFERRING PHYSICIAN, Primary Care Physician, Consult, History of Present Illness, Review of Systems, PAST MEDICAL/SURGICAL HISTORY, HOME MEDICATIONS, Current Medications, ALLERGIES, NURSING VITAL SIGNS, Physical Exam-, LAB RESULTS, RADIOLOGY RESULTS, Recommendations   Last Updated: 20-Sep-14 03:08 by Anabel Bene (MD)

## 2014-11-08 NOTE — H&P (Signed)
PATIENT NAME:  Mary Bryant, Mary Bryant MR#:  [id number] DATE OF BIRTH:  07/13/1966  DATE OF ADMISSION:  04/04/2013  PRIMARY CARE PHYSICIAN:  Dr. Deborra Medina.   REFERRING PHYSICIAN:  Marijo Conception, physician Assistant.   CHIEF COMPLAINT:  Chest pain, headache.   HISTORY OF PRESENT ILLNESS:  Mary Bryant is a 49 year old female with a history of migraine headaches, gastroesophageal reflux disease, has been experiencing chest pain since Sunday.  Concerning this, the patient was seen by Dr. Clayborn Bigness.  The patient underwent stress test today which came back to be negative for any inducible ischemia.  The patient went home, started to experience headache.  The patient states it is a severe headache in the frontal occipital region.  Concerning this, called EMS and came to the Emergency Department.  In the Emergency Department, the patient received one dose of Dilaudid.  Concerning about the patient's chest pain discussed with Dr. Nehemiah Massed who recommended the patient to be admitted for possible left heart cath.  The patient states the pain is sharp in nature, radiating into the back, 10 by 10 in intensity.  States nauseated, did not have any episodes of vomiting.  Work-up in the Emergency Department with EKG and cardiac enzymes were unremarkable.  While the patient was waiting in the Emergency Department, the patient continued to shake.  Evaluated by the Emergency Department physician as well as by me.  The patient states does not know why she is shaking.  The patient is well-conscious, oriented.  Mother thinks these are the seizures, wanted her to be evaluated.  Comforted the patient's family that this is not seizures as the patient is well-conscious, oriented.  PAST MEDICAL HISTORY:  1.  Gastroesophageal reflux disease.  2.  Migraine headaches.   PAST SURGICAL HISTORY: 1.  Cholecystectomy.  2.  Partial hysterectomy.   ALLERGIES:  BENADRYL AND PHENERGAN.   HOME MEDICATIONS: 1.  Valium 5 mg 4 times a day as  needed.  2.  Tramadol 50 mg 4 times a day.  3.  Omeprazole 20 mg once a day.   SOCIAL HISTORY:  No history of smoking, drinking alcohol or using illicit drugs.  Lives with his fiance.   FAMILY HISTORY:  For mother, strong family history of diabetes mellitus, hypertension, multiple family members with MI.    REVIEW OF SYSTEMS:   The patient has multiple systems positive.  CONSTITUTIONAL:  Has generalized weakness, fatigue.  EYES:  No change in vision.  EARS:  No change in hearing.  RESPIRATORY:  States has shortness of breath secondary to pain.  CARDIOVASCULAR:  Has chest pain.  GASTROINTESTINAL:  Has nausea.  No abdominal pain.  No episodes of vomiting.  No diarrhea.  GENITOURINARY:  No dysuria or hemorrhage.  HEMATOLOGIC:  No easy bruising or bleeding.  SKIN:  No rash or lesions.  MUSCULOSKELETAL:  No joint pains and aches.  NEUROLOGIC:  Complains of headache. PSYCHIATRIC:  Feels anxiety, depression.   PHYSICAL EXAMINATION: GENERAL:  This is a well-built, well-nourished, age-appropriate female lying down in the bed, looks anxious.  VITAL SIGNS:  Temperature 98.2, pulse 62, blood pressure 108/60, respiratory rate of 18, oxygen saturations 96% on room air.  HEENT:  Head normocephalic, atraumatic.  Eyes, no scleral icterus.  Conjunctivae normal.  Pupils equal and react to light.  Extraocular movements are intact.  Mucous membranes moist.  NECK:  Supple.  No lymphadenopathy.  No JVD.  No carotid bruit.  CHEST:  Has focal tenderness on the left parasternal area under  the left breast, reproducible pain.   LUNGS:  Bilaterally clear to auscultation.  HEART:  S1 and S2 regular.  No murmurs are heard.  No pedal edema.  Pulses 2+.  ABDOMEN:  Bowel sounds plus.  Soft, nontender, nondistended.  No hepatosplenomegaly.  SKIN:  No rash or lesions.  MUSCULOSKELETAL:  Good range of motion in all the extremities.   NEUROLOGIC:  The patient is alert, oriented to place, person and time.  Cranial nerves  II through XII intact.  Motor 5 by 5 in upper and lower extremities.   LABORATORY DATA:  BMP is completely within normal limits.  Troponin is less than 0.02.  CK 101, CK-MB of less than 0.5.  CT head without contrast, no acute intracranial abnormality.   ASSESSMENT AND PLAN:  Mary Bryant is a 49 year old female who comes to the Emergency Department with chest pain.  1.  Chest pain.  This is a musculoskeletal pain, reproducible pain.  The patient had a stress test done today which came back to be negative.  Further work-up as per Dr. Clayborn Bigness in the morning.  2.  Headache.  Again, this seems to be more of a tension headache.  We will continue the symptomatic management.  CT head is negative.  3.  Shaking, pseudoseizures.  The patient would benefit being evaluated by the psychiatry.  4.  Keep the patient on DVT prophylaxis with Lovenox.   TIME SPENT:  45 minutes.    ____________________________ Monica Becton, MD pv:ea D: 04/05/2013 00:28:04 ET T: 04/05/2013 01:22:34 ET JOB#: [id number]  cc: Monica Becton, MD, <Dictator> Deborra Medina, MD Monica Becton MD ELECTRONICALLY SIGNED 04/10/2013 21:04

## 2014-11-08 NOTE — Discharge Summary (Signed)
PATIENT NAME:  Mary Bryant, Mary Bryant MR#:  [id number] DATE OF BIRTH:  Jun 25, 1966  DATE OF ADMISSION:  04/04/2013 DATE OF DISCHARGE:  04/06/2013   PRIMARY CARE PHYSICIAN: Deborra Medina, MD  CHIEF COMPLAINT: Chest pain, headache.   DISCHARGE DIAGNOSES:  1. Chest pain, likely musculoskeletal in nature.  2. Tremors, likely conversion disorder.  3. Adjustment disorder with anxiety.  4. Gastroesophageal reflux disease.  5. History of migraines.   DISCHARGE MEDICATIONS:  1. Omeprazole 20 mg daily.  2. Tramadol 50 mg every 4 hours as needed for pain.   DIET: Low sodium.   ACTIVITY: As tolerated.   FOLLOWUP: Please follow with PCP and your cardiologist within 1 to 2 weeks. Please follow with Dr. Allen Norris on Monday, as previously scheduled, for your GI.   DISPOSITION: Home.   HISTORY OF PRESENT ILLNESS AND HOSPITAL COURSE: For full details of H and P, please see the dictation on September 17 by Dr. Lunette Stands, but briefly, this is a 49 year old female with a history of headaches and GERD, who has been having chest pains recently and underwent a stress test as an outpatient by Dr. Clayborn Bigness, which was negative for inducible ischemia. The patient came in for recurrent symptoms and was admitted to the hospitalist service. The patient had reproducible chest pain on arrival, per H and P. She was ruled out for acute coronary syndrome with cyclic cardiac markers. She had her CBC within normal limits and was seen by cardiology, Dr. Saralyn Pilar, who recommended outpatient followup. The patient also had been experiencing these shaking episodes and tremors and was seen by neurology as well as psychiatry. The neurologist believed that this was likely conversion disorder triggered by stress and anxiety in dealing with her multiple jobs and loss of some. The patient was also seen by psychiatry and diagnosed with adjustment disorder with anxiety; however, the patient did not want any medications at this point. The patient was  given psychotherapy information by psychiatrist, and she also underwent negative EEG for epileptiform activity, and at this point, she will be discharged. The patient does have GERD and does have a followup appointment by her family on Monday with Dr. Allen Norris from GI. She was recommended to follow up with this appointment as well.   TOTAL TIME SPENT: 35 minutes.   CODE STATUS: The patient is full code.   ____________________________ Vivien Presto, MD sa:OSi D: 04/07/2013 08:17:00 ET T: 04/07/2013 08:40:58 ET JOB#: [id number]  cc: Vivien Presto, MD, <Dictator> Deborra Medina, MD Vivien Presto MD ELECTRONICALLY SIGNED 04/18/2013 15:05

## 2014-11-08 NOTE — Consult Note (Signed)
PATIENT NAME:  Mary Bryant, Mary Bryant MR#:  [id number] DATE OF BIRTH:  04-29-1966  DATE OF CONSULTATION:  04/05/2013  REFERRING PHYSICIAN:  Vivien Presto, MD CONSULTING PHYSICIAN:  Isaias Cowman, MD  CHIEF COMPLAINT: Headache and chest pain.   HISTORY OF PRESENT ILLNESS: The patient is a 49 year old female referred for evaluation of chest pain. The patient was recently hospitalized, seen by Dr. Clayborn Bigness in consultation and underwent Lexiscan sestamibi study, which did not reveal evidence for ischemia. The day following discharge, the patient experienced a frontal headache, came to [facility], where she was also complaining of chest pain. Describes it as 10 out of 10, sharp in nature, reproducible by palpation. EKG revealed chronic left bundle branch block. Her troponin is negative. The patient has been seen by Dr. Melrose Nakayama for headache and right-sided tremors, with initial impression that this may actually be a conversion disorder.   PAST MEDICAL HISTORY:  1. Migraine headaches.  2. Gastroesophageal reflux disease.   MEDICATIONS: 1. Valium 5 mg q.i.d. p.r.n. 2. Tramadol 50 mg q.i.d. 3. Omeprazole 20 mg daily.   SOCIAL HISTORY: The patient is single, lives with her fiance. She has 2 children. She does not smoke.   FAMILY HISTORY: Positive for coronary artery disease.   REVIEW OF SYSTEMS:  CONSTITUTIONAL: No fever or chills.  EYES: No blurry vision.  EARS: No hearing loss.  RESPIRATORY: The patient does have shortness of breath.  CARDIOVASCULAR: Chest pain as described above.  GASTROINTESTINAL: The patient does have nausea. Denies vomiting, diarrhea or constipation.  GENITOURINARY: No dysuria or hematuria.  ENDOCRINE: No polyuria or polydipsia.  MUSCULOSKELETAL: No arthralgias or myalgias.  NEUROLOGICAL: The patient has a frontal headache as well as resting tremors in her right hand.   PHYSICAL EXAMINATION:  VITAL SIGNS: Blood pressure 106/72, pulse 71, respirations 97.7,  pulse oximetry 97%.  HEENT: Pupils equally reactive to light and accommodation.  NECK: Supple, without thyromegaly.  LUNGS: Clear.  CARDIOVASCULAR: Normal JVP. Normal PMI. Regular rate and rhythm. Normal S1, S2. No appreciable gallop, murmur or rub.  ABDOMEN: Soft and nontender. Pulses were intact bilaterally.  MUSCULOSKELETAL: Normal muscle tone.  NEUROLOGICAL: The patient is alert and oriented, complaining of a frontal headache with tremors in her right arm.   IMPRESSION: A 49 year old female who presents with multisymptom complex consistent for conversion disorder by Dr. Melrose Nakayama. The patient also complains of chest pain, which is reproducible by palpation, with negative troponin and negative recent Lexiscan sestamibi study.   RECOMMENDATIONS:  1. Agree with overall current therapy.  2. Would defer full-dose anticoagulation.  3. Chest pain likely noncardiac. Would defer proceeding with cardiac catheterization, especially in the setting of a probable conversion disorder.   ____________________________ Isaias Cowman, MD ap:OSi D: 04/05/2013 13:31:53 ET T: 04/05/2013 13:52:52 ET JOB#: [id number]  cc: Isaias Cowman, MD, <Dictator> Isaias Cowman MD ELECTRONICALLY SIGNED 04/30/2013 11:24

## 2014-11-17 ENCOUNTER — Other Ambulatory Visit: Payer: Self-pay | Admitting: Internal Medicine

## 2014-11-25 NOTE — Telephone Encounter (Signed)
Mailed unread message to pt  

## 2014-12-05 ENCOUNTER — Other Ambulatory Visit: Payer: Self-pay | Admitting: Orthopedic Surgery

## 2014-12-05 DIAGNOSIS — S83421A Sprain of lateral collateral ligament of right knee, initial encounter: Secondary | ICD-10-CM

## 2014-12-05 DIAGNOSIS — M25461 Effusion, right knee: Secondary | ICD-10-CM

## 2014-12-07 ENCOUNTER — Ambulatory Visit
Admission: RE | Admit: 2014-12-07 | Discharge: 2014-12-07 | Disposition: A | Discharge status: Home / Self Care | Payer: Worker's Compensation | Source: Ambulatory Visit | Attending: Orthopedic Surgery | Admitting: Orthopedic Surgery

## 2014-12-07 DIAGNOSIS — S82424A Nondisplaced transverse fracture of shaft of right fibula, initial encounter for closed fracture: Secondary | ICD-10-CM | POA: Insufficient documentation

## 2014-12-07 DIAGNOSIS — M25461 Effusion, right knee: Secondary | ICD-10-CM | POA: Insufficient documentation

## 2014-12-07 DIAGNOSIS — M85661 Other cyst of bone, right lower leg: Secondary | ICD-10-CM | POA: Diagnosis not present

## 2014-12-07 DIAGNOSIS — S83421A Sprain of lateral collateral ligament of right knee, initial encounter: Secondary | ICD-10-CM

## 2014-12-07 DIAGNOSIS — M25561 Pain in right knee: Secondary | ICD-10-CM | POA: Diagnosis present

## 2014-12-09 ENCOUNTER — Telehealth: Payer: Self-pay

## 2014-12-09 MED ORDER — ACYCLOVIR 5 % EX OINT: 1.0000 "application " | TOPICAL_OINTMENT | CUTANEOUS | Status: DC

## 2014-12-09 NOTE — Telephone Encounter (Signed)
Patient notified

## 2014-12-09 NOTE — Telephone Encounter (Signed)
Acyclovir ointment sent in

## 2014-12-09 NOTE — Telephone Encounter (Signed)
The pt is calling hoping to have a medication for a fever blister called in.  Pt callback - [phone]

## 2014-12-09 NOTE — Telephone Encounter (Signed)
Patient stated has fever blister on mouth 3 days and broken her fibula where meets the knee and cannot come into office an ask if MD would call in medication for fever blister.

## 2014-12-13 ENCOUNTER — Emergency Department: Payer: 59

## 2014-12-13 ENCOUNTER — Emergency Department
Admission: EM | Admit: 2014-12-13 | Discharge: 2014-12-14 | Disposition: A | Discharge status: Home / Self Care | Payer: 59 | Attending: Emergency Medicine | Admitting: Emergency Medicine

## 2014-12-13 ENCOUNTER — Encounter: Payer: Self-pay | Admitting: Emergency Medicine

## 2014-12-13 DIAGNOSIS — Y9301 Activity, walking, marching and hiking: Secondary | ICD-10-CM | POA: Diagnosis not present

## 2014-12-13 DIAGNOSIS — Y92003 Bedroom of unspecified non-institutional (private) residence as the place of occurrence of the external cause: Secondary | ICD-10-CM | POA: Insufficient documentation

## 2014-12-13 DIAGNOSIS — W1839XA Other fall on same level, initial encounter: Secondary | ICD-10-CM | POA: Insufficient documentation

## 2014-12-13 DIAGNOSIS — S0993XA Unspecified injury of face, initial encounter: Secondary | ICD-10-CM | POA: Diagnosis not present

## 2014-12-13 DIAGNOSIS — Y998 Other external cause status: Secondary | ICD-10-CM | POA: Diagnosis not present

## 2014-12-13 DIAGNOSIS — I10 Essential (primary) hypertension: Secondary | ICD-10-CM | POA: Diagnosis not present

## 2014-12-13 DIAGNOSIS — R55 Syncope and collapse: Secondary | ICD-10-CM | POA: Insufficient documentation

## 2014-12-13 DIAGNOSIS — Z79899 Other long term (current) drug therapy: Secondary | ICD-10-CM | POA: Insufficient documentation

## 2014-12-13 MED ORDER — ACETAMINOPHEN 500 MG PO TABS
1000.0000 mg | ORAL_TABLET | ORAL | Status: AC
  Administered 2014-12-14: 1000 mg via ORAL

## 2014-12-13 NOTE — ED Notes (Signed)
Pt presents to ED via EMS from home with c/o syncope and fall. Pt reports that she was getting out of the shower and the next thing she knew "I was picking myself up off the floor." Pt reports pain to face and reported some bleeding from nose which is controlled at this time. Pt is A&O x 4 during triage.

## 2014-12-13 NOTE — ED Provider Notes (Signed)
[facility] Emergency Department Provider Note  ____________________________________________  Time seen: Approximately 11:30 PM  I have reviewed the triage vital signs and the nursing notes.   HISTORY  Chief Complaint Loss of Consciousness and Facial Pain    HPI Mary Bryant is a 49 y.o. female who got out of the shower this evening, and "passed out" while walking to her bed. The patient reports she got out of the shower and was walking into her bedroom when she found herself on the way, floor. She noticed that the left side of her face was sore since then. She denies any chest pain, palpitations, trouble breathing, or other concerns. She does note that the left side of her face feels sore, but does not appear swollen. There were a couple of drops of blood she is not quite sure where from, possibly from the left naris. She denies dental injury. No neck pain.  In the ER she states she currently has pain over the left side of the face, no vision changes, and no other complaints.   Past Medical History  Diagnosis Date  . Hypertension     no prior treatment  . Insomnia     chronic, no prior sleep study  . Esophageal stricture     dilated 2009,  [place]  . rhinitis allergic     Patient Active Problem List   Diagnosis Date Noted  . Weight gain due to medication 04/21/2014  . Long-term use of high-risk medication 04/21/2014  . Trigeminal neuralgia of left side of face 03/22/2014  . 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 Conversion disorder with abnormal movement, persistent, with psychological stressor 02/03/2014  . Occasional tremors 01/07/2014  . Abnormal mammogram 08/08/2013  . Lump or mass in breast 08/08/2013  . Hypovitaminosis D 06/10/2013  . Generalized anxiety disorder 06/08/2013  . Screening for breast cancer 06/08/2013  . CAD in native artery 06/08/2013  . H/O left bundle branch block 06/08/2013  . Overweight (BMI 25.0-29.9) 01/17/2012  . Insomnia    . Esophageal stricture     Past Surgical History  Procedure Laterality Date  . Cholecystectomy  2008  . Abdominal hysterectomy  2000    no history of ca     Current Outpatient Rx  Name  Route  Sig  Dispense  Refill  . carbamazepine (TEGRETOL) 200 MG tablet      TAKE 1 TABLET (200 MG TOTAL) BY MOUTH 3 (THREE) TIMES DAILY.   90 tablet   3   . escitalopram (LEXAPRO) 20 MG tablet      TAKE 1 TABLET (20 MG TOTAL) BY MOUTH DAILY.   30 tablet   0     NEEDS OFFICE VISIT PRIOR TO FURTHER REFILLS, PLEAS ...   . metoprolol succinate (TOPROL-XL) 25 MG 24 hr tablet   Oral   Take 2 tablets (50 mg total) by mouth daily. Patient taking differently: Take 25 mg by mouth daily.    60 tablet   3   . acetaminophen (TYLENOL) 500 MG tablet   Oral   Take 1,000 mg by mouth every 6 (six) hours as needed for mild pain.         Marland Kitchen acyclovir (ZOVIRAX) 400 MG tablet   Oral   Take 400 mg by mouth daily as needed (fever blisters).         Marland Kitchen acyclovir ointment (ZOVIRAX) 5 %   Topical   Apply 1 application topically every 3 (three) hours.   5 g  1   . Cholecalciferol (VITAMIN D3) 1000 UNITS CAPS   Oral   Take 1 capsule by mouth daily.         . clonazePAM (KLONOPIN) 0.5 MG tablet      TAKE 1 TABLET BY MOUTH TWICE A DAY AS NEEDED FOR ANXIETY   60 tablet   0     Not to exceed 6 additional fills before 09/14/2014 ...   . cyanocobalamin 1000 MCG tablet   Oral   Take 100 mcg by mouth daily.         . Magnesium 250 MG TABS   Oral   Take 1 tablet by mouth daily.         . ondansetron (ZOFRAN) 4 MG tablet   Oral   Take 1 tablet (4 mg total) by mouth every 8 (eight) hours as needed for nausea or vomiting.   20 tablet   0     Allergies Benadryl and Promethazine hcl  Family History  Problem Relation Age of Onset  . Cancer Father     Bladder,  in Hospice  . Diabetes type II Father   . COPD Father   . Coronary artery disease Father   . Heart disease Father 35  .  Diabetes Mother   . Hyperlipidemia Mother   . Hypertension Mother   . Early death Paternal Grandmother   . Heart disease Paternal Grandmother     CAD    Social History History  Substance Use Topics  . Smoking status: Never Smoker   . Smokeless tobacco: Never Used  . Alcohol Use: No    Review of Systems Constitutional: No fever/chills Eyes: No visual changes. No double vision ENT: No sore throat. Feels sore over the left frontal sinus. Cardiovascular: Denies chest pain. Respiratory: Denies shortness of breath. Gastrointestinal: No abdominal pain.  No nausea, no vomiting.  No diarrhea.  No constipation. Genitourinary: Negative for dysuria. Musculoskeletal: Negative for back pain. Skin: Negative for rash. Neurological: Negative for headaches, focal weakness or numbness.  10-point ROS otherwise negative.  ____________________________________________   PHYSICAL EXAM:  VITAL SIGNS: ED Triage Vitals  Enc Vitals Group     BP --      Pulse --      Resp --      Temp --      Temp src --      SpO2 --      Weight --      Height --      Head Cir --      Peak Flow --      Pain Score --      Pain Loc --      Pain Edu? --      Excl. in [place]? --     Constitutional: Alert and oriented. Well appearing and in no acute distress. Eyes: Conjunctivae are normal. PERRL. EOMI. Head: Atraumatic except for tenderness over the left maxillary sinus. Normal extraocular movements. Normal orbits. No evidence of bruising to the face or head. No scalp hematoma. Nose: No congestion/rhinnorhea. Mouth/Throat: Mucous membranes are moist.  Oropharynx non-erythematous. Neck: No stridor.  No cervical spine tenderness to palpation. Cardiovascular: Normal rate, regular rhythm. Grossly normal heart sounds.  Good peripheral circulation. Respiratory: Normal respiratory effort.  No retractions. Lungs CTAB. Gastrointestinal: Soft and nontender. No distention. No abdominal bruits. No CVA  tenderness. Musculoskeletal: No lower extremity tenderness nor edema.  No joint effusions. Neurologic:  Normal speech and language. No gross focal neurologic deficits are  appreciated. Speech is normal. No gait instability. Skin:  Skin is warm, dry and intact. No rash noted. Psychiatric: Mood and affect are normal. Speech and behavior are normal.  ____________________________________________   LABS (all labs ordered are listed, but only abnormal results are displayed)  Labs Reviewed  BASIC METABOLIC PANEL - Abnormal; Notable for the following:    Glucose, Bld 126 (*)    All other components within normal limits  CBC - Abnormal; Notable for the following:    WBC 11.6 (*)    All other components within normal limits   ____________________________________________  EKG  EKG reviewed and personally interpreted by me. Normal sinus rhythm, left axis deviation with left bundle-branch block. QTC is 486, however this does incorporate widened QRS due to left bundle.  There is no evidence of acute ischemic change. Patient has a pre-existing left bundle-branch block. ____________________________________________  RADIOLOGY  CT Head IMPRESSION: 1. No evidence of traumatic intracranial injury or fracture. 2. No evidence of fracture or dislocation with regard to the maxillofacial structures. ____________________________________________   PROCEDURES  Procedure(s) performed: None  Critical Care performed: No  ____________________________________________   INITIAL IMPRESSION / ASSESSMENT AND PLAN / ED COURSE  Pertinent labs & imaging results that were available during my care of the patient were reviewed by me and considered in my medical decision making (see chart for details).  Patient presents after an unwitnessed syncopal episode. Patient reports she has had a prior diagnosis of questionable "seizures", never been placed on medication for it. These tend to occur in a similar  fashion. Today she awoke and does not appear to have any postictal state. She woke oriented on the ground having just walked out of the shower at her bedroom. Etiologies include possible vasovagal episode, arrhythmia, I doubt stroke versus acute coronary syndrome as the patient has a very normal exam at this time, she complains of no neurologic symptoms, no chest pain.  We will check orthostatic vital signs in the ER, continue to monitor her, and obtain basic labs and ECG. Patient does have a history of a known left bundle-branch block per her report.  ----------------------------------------- 1:14 AM on 12/14/2014 -----------------------------------------  Patient feels improved, she does note that her nose is still sore. We will give her ibuprofen for this. I reviewed her labs and CT scan. She does have slight positive orthostatics, we will give a liter fluid bolus here. I anticipate that patient will likely be discharged thereafter. ____________________________________________   ----------------------------------------- 3:18 AM on 12/14/2014 -----------------------------------------  Patient awake alert in no distress. Reviewed her labs. She is now well-hydrated. Further chart review suggests the patient may have a history of pseudoseizures, though I do not believe that is what happened today. She did not have any incontinence and did not bite any lip or tongue. I do not believe she had a seizure, but would favor a possible vasovagal type episode given she had just got out of the shower.  Discussed with the patient no driving until cleared by her primary care physician. She will call her primary on Tuesday to set up follow-up.  FINAL CLINICAL IMPRESSION(S) / ED DIAGNOSES  Final diagnoses:  Syncope, non cardiac      Delman Kitten, MD 12/14/14 [phone]

## 2014-12-14 ENCOUNTER — Other Ambulatory Visit: Payer: Self-pay

## 2014-12-14 LAB — CBC
HCT: 41.5 % (ref 35.0–47.0)
Hemoglobin: 13.8 g/dL (ref 12.0–16.0)
MCH: 29.8 pg (ref 26.0–34.0)
MCHC: 33.2 g/dL (ref 32.0–36.0)
MCV: 89.7 fL (ref 80.0–100.0)
Platelets: 308 10*3/uL (ref 150–440)
RBC: 4.62 MIL/uL (ref 3.80–5.20)
RDW: 13.4 % (ref 11.5–14.5)
WBC: 11.6 10*3/uL — AB (ref 3.6–11.0)

## 2014-12-14 LAB — BASIC METABOLIC PANEL
ANION GAP: 10 (ref 5–15)
BUN: 14 mg/dL (ref 6–20)
CO2: 24 mmol/L (ref 22–32)
Calcium: 9 mg/dL (ref 8.9–10.3)
Chloride: 104 mmol/L (ref 101–111)
Creatinine, Ser: 0.88 mg/dL (ref 0.44–1.00)
GFR calc non Af Amer: >60 mL/min (ref >60)
Glucose, Bld: 126 mg/dL — ABNORMAL HIGH (ref 65–99)
POTASSIUM: 3.7 mmol/L (ref 3.5–5.1)
Sodium: 138 mmol/L (ref 135–145)

## 2014-12-14 MED ORDER — ACETAMINOPHEN 500 MG PO TABS
ORAL_TABLET | ORAL | Status: AC
  Administered 2014-12-14: 1000 mg via ORAL
  Filled 2014-12-14: qty 2

## 2014-12-14 MED ORDER — SODIUM CHLORIDE 0.9 % IV BOLUS (SEPSIS)
1000.0000 mL | Freq: Once | INTRAVENOUS | Status: AC
  Administered 2014-12-14: 1000 mL via INTRAVENOUS

## 2014-12-14 NOTE — ED Notes (Signed)
Pt resting quietly on stretcher with ice pack to face and lights dimmed in room. IV fluids continue to infuse at this time. No acute distress noted. Will continue to monitor.

## 2014-12-14 NOTE — Discharge Instructions (Signed)
Syncope  No driving until cleared by her primary care physician. Please return to the ER right away should you pass out again, feel weak, dizzy, developed chest pain, shortness of breath, or other new concerns arise.  Follow-up with her primary care physician on Tuesday.  Syncope means a person passes out (faints). The person usually wakes up in less than 5 minutes. It is important to seek medical care for syncope. HOME CARE  Have someone stay with you until you feel normal.  Do not drive, use machines, or play sports until your doctor says it is okay.  Keep all doctor visits as told.  Lie down when you feel like you might pass out. Take deep breaths. Wait until you feel normal before standing up.  Drink enough fluids to keep your pee (urine) clear or pale yellow.  If you take blood pressure or heart medicine, get up slowly. Take several minutes to sit and then stand. GET HELP RIGHT AWAY IF:   You have a severe headache.  You have pain in the chest, belly (abdomen), or back.  You are bleeding from the mouth or butt (rectum).  You have black or tarry poop (stool).  You have an irregular or very fast heartbeat.  You have pain with breathing.  You keep passing out, or you have shaking (seizures) when you pass out.  You pass out when sitting or lying down.  You feel confused.  You have trouble walking.  You have severe weakness.  You have vision problems. If you fainted, call your local emergency services (911 in [place] Do not drive yourself to the hospital. MAKE SURE YOU:   Understand these instructions.  Will watch your condition.  Will get help right away if you are not doing well or get worse. Document Released: 12/22/2007 Document Revised: 01/04/2012 Document Reviewed: 09/03/2011 [facility] Patient Information 2015 [place], [place]. This information is not intended to replace advice given to you by your health care provider. Make sure you discuss any questions you  have with your health care provider.

## 2014-12-21 ENCOUNTER — Other Ambulatory Visit: Payer: Self-pay | Admitting: Internal Medicine

## 2014-12-23 NOTE — Telephone Encounter (Signed)
Last visit 04/19/14, mychart msg sent on need for appt. Refill?

## 2014-12-24 NOTE — Telephone Encounter (Signed)
Refill for 30 days only.  Will need six month follow up prior to any more refills

## 2014-12-25 NOTE — Telephone Encounter (Signed)
My chart message sent as to need for an appointment.

## 2015-01-03 ENCOUNTER — Ambulatory Visit (INDEPENDENT_AMBULATORY_CARE_PROVIDER_SITE_OTHER): Payer: 59 | Admitting: Nurse Practitioner

## 2015-01-03 ENCOUNTER — Encounter: Payer: Self-pay | Admitting: Nurse Practitioner

## 2015-01-03 VITALS — BP 118/78 | HR 70 | Temp 97.8°F | Resp 12 | Ht 68.0 in | Wt 199.2 lb

## 2015-01-03 DIAGNOSIS — B009 Herpesviral infection, unspecified: Secondary | ICD-10-CM

## 2015-01-03 DIAGNOSIS — F411 Generalized anxiety disorder: Secondary | ICD-10-CM

## 2015-01-03 MED ORDER — ACYCLOVIR 400 MG PO TABS: 400.0000 mg | ORAL_TABLET | Freq: Every day | ORAL | Status: DC | PRN

## 2015-01-03 NOTE — Patient Instructions (Signed)
10 mg for 7 days  Half of the half for 5 mg for 7 days then stop, you are done!   Let us know if you have anxiety increasing again!

## 2015-01-03 NOTE — Progress Notes (Signed)
Pre visit review using our clinic review tool, if applicable. No additional management support is needed unless otherwise documented below in the visit note. 

## 2015-01-03 NOTE — Progress Notes (Signed)
   Subjective:    Patient ID: Mary Bryant, female    DOB: [birth year], 49 y.o.   MRN: [id number]  HPI  Ms. Roulhac is a 49 yo female with a CC of medication re-evaluation.   1) Lexapro- 20 mg daily, ready to wean off, been on for 6 months. Had a bunch of family members she was supporting, now she is free of them. Not as anxious. This week on 10 mg.   2) Refill of Acyclovir- fever blisters when young, has a recurring spot that comes up on her buttock. 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 No recent outbreaks, but is out of medication currently.   Review of Systems  Constitutional: Negative for fever, chills, diaphoresis and fatigue.  Respiratory: Negative for chest tightness, shortness of breath and wheezing.   Cardiovascular: Negative for chest pain, palpitations and leg swelling.  Gastrointestinal: Negative for nausea, vomiting and diarrhea.  Skin: Negative for rash.  Neurological: Negative for dizziness, weakness, numbness and headaches.  Psychiatric/Behavioral: The patient is not nervous/anxious.       Objective:   Physical Exam  Constitutional: She is oriented to person, place, and time. She appears well-developed and well-nourished. No distress.  HENT:  Head: Normocephalic and atraumatic.  Right Ear: External ear normal.  Left Ear: External ear normal.  Mouth/Throat: No oropharyngeal exudate.  Eyes: EOM are normal. Pupils are equal, round, and reactive to light. Right eye exhibits no discharge. Left eye exhibits no discharge. No scleral icterus.  Cardiovascular: Normal rate, regular rhythm and normal heart sounds.  Exam reveals no gallop and no friction rub.   No murmur heard. Pulmonary/Chest: Effort normal and breath sounds normal. No respiratory distress. She has no wheezes. She has no rales. She exhibits no tenderness.  Neurological: She is alert and oriented to person, place, and time. No cranial nerve deficit. She exhibits normal muscle tone. Coordination normal.  Skin: Skin is warm  and dry. No rash noted. She is not diaphoretic.  Psychiatric: She has a normal mood and affect. Her behavior is normal. Judgment and thought content normal.      Assessment & Plan:

## 2015-01-05 DIAGNOSIS — B009 Herpesviral infection, unspecified: Secondary | ICD-10-CM | POA: Insufficient documentation

## 2015-01-05 NOTE — Assessment & Plan Note (Signed)
Acyclovir script filled. No recent outbreaks, but does not want to risk not having med on hand. FU prn worsening/failure to improve.

## 2015-01-05 NOTE — Assessment & Plan Note (Signed)
Stable on Lexapro for 6 months. She reports her stressors have been removed and she is ready to wean off. We discussed that weaning may uncover some anxiety and to let us know if worsening so we can adjust. Discussed the weaning process. Gave written instructions on the AVS. FU prn worsening/failure to improve.

## 2015-01-27 ENCOUNTER — Ambulatory Visit (INDEPENDENT_AMBULATORY_CARE_PROVIDER_SITE_OTHER): Payer: Self-pay | Admitting: Internal Medicine

## 2015-01-27 ENCOUNTER — Encounter: Payer: Self-pay | Admitting: Internal Medicine

## 2015-01-27 VITALS — BP 131/84 | HR 90 | Temp 98.2°F | Ht 68.0 in | Wt 198.0 lb

## 2015-01-27 DIAGNOSIS — F444 Conversion disorder with motor symptom or deficit: Secondary | ICD-10-CM

## 2015-01-27 DIAGNOSIS — L02411 Cutaneous abscess of right axilla: Secondary | ICD-10-CM

## 2015-01-27 NOTE — Progress Notes (Signed)
Pre visit review using our clinic review tool, if applicable. No additional management support is needed unless otherwise documented below in the visit note. 

## 2015-01-27 NOTE — Progress Notes (Signed)
Subjective:  Patient ID: CARISHA KANTOR, female    DOB: 1966-04-12  Age: 49 y.o. MRN: [id number]  CC: The primary encounter diagnosis was Conversion disorder with abnormal movement, persistent, with psychological stressor. A diagnosis of Abscess of axilla, right was also pertinent to this visit.  HPI CORTNE AMARA presents for follow up on recent ER visit for incision and drainage of a very large abscess that developed under her right axilla. The abscess has been draining and shrinking in size since the incision was done by [facility] ER<  antibioitics were changed from Keflex to septra DS.  Some nausea and anorexia   But no diarrhea.  Off of lexapro   Feeling better. 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  a new  Business in [place]  [facility] is requiring a 2 week series of TB skin test   .   Outpatient Prescriptions Prior to Visit  Medication Sig Dispense Refill  . acetaminophen (TYLENOL) 500 MG tablet Take 1,000 mg by mouth every 6 (six) hours as needed for mild pain.    Marland Kitchen acyclovir (ZOVIRAX) 400 MG tablet Take 1 tablet (400 mg total) by mouth daily as needed (fever blisters). 30 tablet 0  . acyclovir ointment (ZOVIRAX) 5 % Apply 1 application topically every 3 (three) hours. 5 g 1  . carbamazepine (TEGRETOL) 200 MG tablet Take 200 mg by mouth 2 (two) times daily.    . clonazePAM (KLONOPIN) 0.5 MG tablet TAKE 1 TABLET BY MOUTH TWICE A DAY AS NEEDED FOR ANXIETY 60 tablet 0  . cyanocobalamin 1000 MCG tablet Take 100 mcg by mouth daily.    . ondansetron (ZOFRAN) 4 MG tablet Take 1 tablet (4 mg total) by mouth every 8 (eight) hours as needed for nausea or vomiting. 20 tablet 0  . Cholecalciferol (VITAMIN D3) 1000 UNITS CAPS Take 1 capsule by mouth daily.    Marland Kitchen escitalopram (LEXAPRO) 20 MG tablet TAKE 1 TABLET (20 MG TOTAL) BY MOUTH DAILY. 30 tablet 0  . Magnesium 250 MG TABS Take 1 tablet by mouth daily.    . metoprolol succinate (TOPROL-XL) 25 MG 24 hr tablet  Take 2 tablets (50 mg total) by mouth daily. (Patient taking differently: Take 25 mg by mouth daily. ) 60 tablet 3   No facility-administered medications prior to visit.    Review of Systems;  Patient denies headache, fevers, malaise, unintentional weight loss, skin rash, eye pain, sinus congestion and sinus pain, sore throat, dysphagia,  hemoptysis , cough, dyspnea, wheezing, chest pain, palpitations, orthopnea, edema, abdominal pain, nausea, melena, diarrhea, constipation, flank pain, dysuria, hematuria, urinary  Frequency, nocturia, numbness, tingling, seizures,  Focal weakness, Loss of consciousness,  Tremor, insomnia, depression, anxiety, and suicidal ideation.      Objective:  BP 131/84 mmHg  Pulse 90  Temp(Src) 98.2 F (36.8 C) (Oral)  Ht 5\' 8"  (1.727 m)  Wt 198 lb (89.812 kg)  BMI 30.11 kg/m2  SpO2 96%  BP Readings from Last 3 Encounters:  01/27/15 131/84  01/03/15 118/78  12/14/14 113/95    Wt Readings from Last 3 Encounters:  01/27/15 198 lb (89.812 kg)  01/03/15 199 lb 3.2 oz (90.357 kg)  12/13/14 201 lb 15.1 oz (91.601 kg)    General appearance: alert, cooperative and appears stated age Ears: normal TM's and external ear canals both ears Throat: lips, mucosa, and tongue normal; teeth and gums normal Neck: no adenopathy, no carotid bruit, supple, symmetrical, trachea midline  and thyroid not enlarged, symmetric, no tenderness/mass/nodules Back: symmetric, no curvature. ROM normal. No CVA tenderness. Lungs: clear to auscultation bilaterally Heart: regular rate and rhythm, S1, S2 normal, no murmur, click, rub or gallop Abdomen: soft, non-tender; bowel sounds normal; no masses,  no organomegaly Pulses: 2+ and symmetric Skin: improving erythema and swelling of right axilla . Abscess is draining spontaneously Lymph nodes: Cervical, supraclavicular, and axillary nodes normal.  Lab Results  Component Value Date   HGBA1C 5.7 05/19/2011    Lab Results  Component  Value Date   CREATININE 0.88 12/14/2014   CREATININE 0.9 04/24/2014   CREATININE 0.8 04/19/2014    Lab Results  Component Value Date   WBC 11.6* 12/14/2014   HGB 13.8 12/14/2014   HCT 41.5 12/14/2014   PLT 308 12/14/2014   GLUCOSE 126* 12/14/2014   CHOL 187 04/24/2014   TRIG 117.0 04/24/2014   HDL 48.00 04/24/2014   LDLCALC 116* 04/24/2014   ALT 60* 04/24/2014   AST 32 04/24/2014   NA 138 12/14/2014   K 3.7 12/14/2014   CL 104 12/14/2014   CREATININE 0.88 12/14/2014   BUN 14 12/14/2014   CO2 24 12/14/2014   TSH 1.06 06/08/2013   HGBA1C 5.7 05/19/2011   MICROALBUR 1.4 05/08/2012    Ct Head Wo Contrast  12/14/2014   CLINICAL DATA:  Acute onset of syncope and fall. Facial pain and epistaxis. Concern for head injury. Initial encounter.  EXAM: CT HEAD WITHOUT CONTRAST  CT MAXILLOFACIAL WITHOUT CONTRAST  TECHNIQUE: Multidetector CT imaging of the head and maxillofacial structures were performed using the standard protocol without intravenous contrast. Multiplanar CT image reconstructions of the maxillofacial structures were also generated.  COMPARISON:  CT of the maxillofacial structures performed 03/17/2014, and CT of the head performed 01/07/2014  FINDINGS: CT HEAD FINDINGS  There is no evidence of acute infarction, mass lesion, or intra- or extra-axial hemorrhage on CT.  The posterior fossa, including the cerebellum, brainstem and fourth ventricle, is within normal limits. The third and lateral ventricles, and basal ganglia are unremarkable in appearance. The cerebral hemispheres are symmetric in appearance, with normal gray-white differentiation. No mass effect or midline shift is seen.  There is no evidence of fracture; visualized osseous structures are unremarkable in appearance. The visualized portions of the orbits are within normal limits. The paranasal sinuses and mastoid air cells are well-aerated. No significant soft tissue abnormalities are seen.  CT MAXILLOFACIAL FINDINGS   There is no evidence of fracture or dislocation. The maxilla and mandible appear intact. The nasal bone is unremarkable in appearance. The visualized dentition demonstrates no acute abnormality.  The orbits are intact bilaterally. The visualized paranasal sinuses and mastoid air cells are well-aerated.  No significant soft tissue abnormalities are seen. The parapharyngeal fat planes are preserved. The nasopharynx, oropharynx and hypopharynx are unremarkable in appearance. The visualized portions of the valleculae and piriform sinuses are grossly unremarkable.  The parotid and submandibular glands are within normal limits. No cervical lymphadenopathy is seen.  IMPRESSION: 1. No evidence of traumatic intracranial injury or fracture. 2. No evidence of fracture or dislocation with regard to the maxillofacial structures.   Electronically Signed   By: Garald Balding M.D.   On: 12/14/2014 00:18   Ct Maxillofacial Wo Cm  12/14/2014   CLINICAL DATA:  Acute onset of syncope and fall. Facial pain and epistaxis. Concern for head injury. Initial encounter.  EXAM: CT HEAD WITHOUT CONTRAST  CT MAXILLOFACIAL WITHOUT CONTRAST  TECHNIQUE: Multidetector CT imaging of  the head and maxillofacial structures were performed using the standard protocol without intravenous contrast. Multiplanar CT image reconstructions of the maxillofacial structures were also generated.  COMPARISON:  CT of the maxillofacial structures performed 03/17/2014, and CT of the head performed 01/07/2014  FINDINGS: CT HEAD FINDINGS  There is no evidence of acute infarction, mass lesion, or intra- or extra-axial hemorrhage on CT.  The posterior fossa, including the cerebellum, brainstem and fourth ventricle, is within normal limits. The third and lateral ventricles, and basal ganglia are unremarkable in appearance. The cerebral hemispheres are symmetric in appearance, with normal gray-white differentiation. No mass effect or midline shift is seen.  There is no  evidence of fracture; visualized osseous structures are unremarkable in appearance. The visualized portions of the orbits are within normal limits. The paranasal sinuses and mastoid air cells are well-aerated. No significant soft tissue abnormalities are seen.  CT MAXILLOFACIAL FINDINGS  There is no evidence of fracture or dislocation. The maxilla and mandible appear intact. The nasal bone is unremarkable in appearance. The visualized dentition demonstrates no acute abnormality.  The orbits are intact bilaterally. The visualized paranasal sinuses and mastoid air cells are well-aerated.  No significant soft tissue abnormalities are seen. The parapharyngeal fat planes are preserved. The nasopharynx, oropharynx and hypopharynx are unremarkable in appearance. The visualized portions of the valleculae and piriform sinuses are grossly unremarkable.  The parotid and submandibular glands are within normal limits. No cervical lymphadenopathy is seen.  IMPRESSION: 1. No evidence of traumatic intracranial injury or fracture. 2. No evidence of fracture or dislocation with regard to the maxillofacial structures.   Electronically Signed   By: Garald Balding M.D.   On: 12/14/2014 00:18    Assessment & Plan:   Problem List Items Addressed This Visit      Unprioritized   Conversion disorder with abnormal movement, persistent, with psychological stressor - Primary    Improved,  Now on lexapro only       Abscess of axilla, right    S/p incision and drainage, with progressive reduction in size and continued drainage.  Continue Septra for one week          I have discontinued Ms. Schmierer's Vitamin D3, Magnesium, and escitalopram. I am also having her maintain her acetaminophen, ondansetron, cyanocobalamin, clonazePAM, acyclovir ointment, acyclovir, carbamazepine, HYDROcodone-acetaminophen, metoprolol succinate, and sulfamethoxazole-trimethoprim.  Meds ordered this encounter  Medications  . HYDROcodone-acetaminophen  (NORCO/VICODIN) 5-325 MG per tablet    Sig: Take by mouth.  . metoprolol succinate (TOPROL-XL) 50 MG 24 hr tablet    Sig: Take 50 mg by mouth.  . sulfamethoxazole-trimethoprim (BACTRIM DS,SEPTRA DS) 800-160 MG per tablet    Sig: Take by mouth.    Medications Discontinued During This Encounter  Medication Reason  . Cholecalciferol (VITAMIN D3) 1000 UNITS CAPS Completed Course  . metoprolol succinate (TOPROL-XL) 25 MG 24 hr tablet Change in therapy  . escitalopram (LEXAPRO) 20 MG tablet Patient Preference  . Magnesium 250 MG TABS Patient Preference    Follow-up: No Follow-up on file.   Crecencio Mc, MD

## 2015-01-29 DIAGNOSIS — L02411 Cutaneous abscess of right axilla: Secondary | ICD-10-CM | POA: Insufficient documentation

## 2015-01-29 NOTE — Assessment & Plan Note (Signed)
S/p incision and drainage, with progressive reduction in size and continued drainage.  Continue Septra for one week

## 2015-01-29 NOTE — Assessment & Plan Note (Signed)
Improved,  Now on lexapro only

## 2015-02-01 ENCOUNTER — Other Ambulatory Visit: Payer: Self-pay | Admitting: Internal Medicine

## 2015-02-09 ENCOUNTER — Other Ambulatory Visit: Payer: Self-pay | Admitting: Internal Medicine

## 2015-03-19 ENCOUNTER — Telehealth: Payer: Self-pay | Admitting: Internal Medicine

## 2015-03-19 MED ORDER — ESCITALOPRAM OXALATE 10 MG PO TABS: 10.0000 mg | ORAL_TABLET | Freq: Every day | ORAL | Status: DC

## 2015-03-19 NOTE — Telephone Encounter (Signed)
Review of chart it appears 7.11.16 med d/c'd (pt preference).  Please advise

## 2015-03-19 NOTE — Telephone Encounter (Signed)
lexapro refilled, starting at 10 mg daily,  Needs one month follow up

## 2015-03-19 NOTE — Telephone Encounter (Signed)
Pt states she needs her anxiety medication refilled for Lexapro. Pharmacy is [facility]. Thank You!

## 2015-03-20 NOTE — Telephone Encounter (Signed)
Called left message on VM that refill sent and requesting pt to call to schedule a one month follow up

## 2015-04-04 ENCOUNTER — Encounter: Payer: Self-pay | Admitting: Family Medicine

## 2015-04-04 ENCOUNTER — Ambulatory Visit (INDEPENDENT_AMBULATORY_CARE_PROVIDER_SITE_OTHER): Payer: No Typology Code available for payment source | Admitting: Family Medicine

## 2015-04-04 VITALS — BP 130/98 | HR 79 | Temp 98.4°F | Ht 68.0 in | Wt 196.5 lb

## 2015-04-04 DIAGNOSIS — R110 Nausea: Secondary | ICD-10-CM

## 2015-04-04 DIAGNOSIS — R1031 Right lower quadrant pain: Secondary | ICD-10-CM | POA: Insufficient documentation

## 2015-04-04 LAB — COMPREHENSIVE METABOLIC PANEL WITH GFR
ALT: 12 U/L (ref 0–35)
AST: 16 U/L (ref 0–37)
Albumin: 4.3 g/dL (ref 3.5–5.2)
Alkaline Phosphatase: 112 U/L (ref 39–117)
BUN: 11 mg/dL (ref 6–23)
CO2: 29 meq/L (ref 19–32)
Calcium: 9.5 mg/dL (ref 8.4–10.5)
Chloride: 103 meq/L (ref 96–112)
Creatinine, Ser: 0.82 mg/dL (ref 0.40–1.20)
GFR: 79.18 mL/min
Glucose, Bld: 83 mg/dL (ref 70–99)
Potassium: 4.4 meq/L (ref 3.5–5.1)
Sodium: 140 meq/L (ref 135–145)
Total Bilirubin: 0.4 mg/dL (ref 0.2–1.2)
Total Protein: 7.2 g/dL (ref 6.0–8.3)

## 2015-04-04 LAB — CBC
HCT: 41.5 % (ref 36.0–46.0)
Hemoglobin: 13.7 g/dL (ref 12.0–15.0)
MCHC: 33.1 g/dL (ref 30.0–36.0)
MCV: 89.5 fl (ref 78.0–100.0)
Platelets: 300 K/uL (ref 150.0–400.0)
RBC: 4.63 Mil/uL (ref 3.87–5.11)
RDW: 13.9 % (ref 11.5–15.5)
WBC: 5.9 K/uL (ref 4.0–10.5)

## 2015-04-04 LAB — LIPASE: Lipase: 38 U/L (ref 11.0–59.0)

## 2015-04-04 MED ORDER — HYDROCODONE-ACETAMINOPHEN 5-325 MG PO TABS: 1.0000 | ORAL_TABLET | Freq: Four times a day (QID) | ORAL | Status: DC | PRN

## 2015-04-04 MED ORDER — ONDANSETRON HCL 4 MG PO TABS: 4.0000 mg | ORAL_TABLET | Freq: Three times a day (TID) | ORAL | Status: DC | PRN

## 2015-04-04 NOTE — Progress Notes (Signed)
Pre visit review using our clinic review tool, if applicable. No additional management support is needed unless otherwise documented below in the visit note. 

## 2015-04-04 NOTE — Assessment & Plan Note (Signed)
Normal vital signs and patient well appearing. Abdominal exam with mild tenderness but no signs of acute abdomen. Will treat nausea with Zofran and will given Hydrocodone for pain while awaiting labs (CBC, CMP, Lipase). No indication for abdominal CT currently. Will await labs.

## 2015-04-04 NOTE — Progress Notes (Signed)
   Subjective:  Patient ID: Mary Bryant, female    DOB: 1965-08-20  Age: 49 y.o. MRN: [id number]  CC: Nausea, Abdominal pain  HPI:  49 year female presents to clinic today for acute visit with complaints of nausea, diarrhea, abdominal pain.  Patient reports that she has had the above symptoms for 3 days. No known inciting factor. She reports that her abdominal pain is located in the right lower quadrant. She reports associated cold sweats but denies any fever. She denies any sick contacts. She states that her abdominal pain is 8/10 in severity. She reports that she has had decreased appetite and has not eaten anything for the past 2-3 days. She reports that she is able to take fluids. No exacerbating or relieving factors. No interventions tried.  Social Hx  - Nonsmoker.  Review of Systems  Constitutional: Positive for appetite change. Negative for fever.       Cold sweats.   Gastrointestinal: Positive for nausea, abdominal pain, diarrhea and blood in stool. Negative for vomiting.   Objective:  BP 130/98 mmHg  Pulse 79  Temp(Src) 98.4 F (36.9 C) (Oral)  Ht 5\' 8"  (1.727 m)  Wt 196 lb 8 oz (89.132 kg)  BMI 29.88 kg/m2  SpO2 96%  BP/Weight 04/04/2015 01/27/2015 04/03/3845  Systolic BP 659 935 701  Diastolic BP 98 84 78  Wt. (Lbs) 196.5 198 199.2  BMI 29.88 30.11 30.3    Physical Exam  Constitutional: She appears well-developed. No distress.  Cardiovascular: Normal rate and regular rhythm.   No murmur heard. Pulmonary/Chest: Effort normal and breath sounds normal. No respiratory distress. She has no wheezes. She has no rales.  Abdominal:  Soft, mildly tender palpation in the right lower quadrant. No guarding or rebound.  Genitourinary:  Normal rectal exam with good tone. Negative hemoccult.  Vitals reviewed.  Assessment & Plan:   Problem List Items Addressed This Visit    RLQ abdominal pain - Primary    Normal vital signs and patient well appearing. Abdominal exam  with mild tenderness but no signs of acute abdomen. Will treat nausea with Zofran and will given Hydrocodone for pain while awaiting labs (CBC, CMP, Lipase). No indication for abdominal CT currently. Will await labs.      Relevant Orders   CBC   Comprehensive metabolic panel   Lipase    Other Visit Diagnoses    Nausea without vomiting        Relevant Medications    ondansetron (ZOFRAN) 4 MG tablet       Meds ordered this encounter  Medications  . ondansetron (ZOFRAN) 4 MG tablet    Sig: Take 1 tablet (4 mg total) by mouth every 8 (eight) hours as needed for nausea or vomiting.    Dispense:  20 tablet    Refill:  0  . HYDROcodone-acetaminophen (NORCO/VICODIN) 5-325 MG per tablet    Sig: Take 1 tablet by mouth every 6 (six) hours as needed for moderate pain.    Dispense:  30 tablet    Refill:  0    Follow-up: PRN  Thersa Salt, DO

## 2015-04-04 NOTE — Patient Instructions (Addendum)
Take the zofran as needed for nausea.  We will call with your lab results.   Use the pain medication as needed.   Take care  Dr. Lacinda Axon

## 2015-05-08 ENCOUNTER — Ambulatory Visit (INDEPENDENT_AMBULATORY_CARE_PROVIDER_SITE_OTHER): Payer: 59 | Admitting: Internal Medicine

## 2015-05-08 ENCOUNTER — Encounter: Payer: Self-pay | Admitting: Internal Medicine

## 2015-05-08 VITALS — BP 126/78 | HR 77 | Temp 98.5°F | Resp 14 | Ht 66.75 in | Wt 196.5 lb

## 2015-05-08 DIAGNOSIS — F39 Unspecified mood [affective] disorder: Secondary | ICD-10-CM

## 2015-05-08 DIAGNOSIS — F419 Anxiety disorder, unspecified: Secondary | ICD-10-CM

## 2015-05-08 DIAGNOSIS — F411 Generalized anxiety disorder: Secondary | ICD-10-CM | POA: Diagnosis not present

## 2015-05-08 DIAGNOSIS — Z0000 Encounter for general adult medical examination without abnormal findings: Secondary | ICD-10-CM | POA: Diagnosis not present

## 2015-05-08 DIAGNOSIS — E669 Obesity, unspecified: Secondary | ICD-10-CM | POA: Diagnosis not present

## 2015-05-08 DIAGNOSIS — Z1239 Encounter for other screening for malignant neoplasm of breast: Secondary | ICD-10-CM

## 2015-05-08 DIAGNOSIS — N951 Menopausal and female climacteric states: Secondary | ICD-10-CM

## 2015-05-08 DIAGNOSIS — Z113 Encounter for screening for infections with a predominantly sexual mode of transmission: Secondary | ICD-10-CM

## 2015-05-08 DIAGNOSIS — Z90710 Acquired absence of both cervix and uterus: Secondary | ICD-10-CM | POA: Insufficient documentation

## 2015-05-08 DIAGNOSIS — R4586 Emotional lability: Secondary | ICD-10-CM

## 2015-05-08 LAB — TSH: TSH: 1.68 u[IU]/mL (ref 0.35–4.50)

## 2015-05-08 LAB — LIPID PANEL
CHOLESTEROL: 225 mg/dL — AB (ref 0–200)
HDL: 75.6 mg/dL (ref >39.00)
LDL CALC: 127 mg/dL — AB (ref 0–99)
NonHDL: 149.28
Total CHOL/HDL Ratio: 3
Triglycerides: 111 mg/dL (ref 0.0–149.0)
VLDL: 22.2 mg/dL (ref 0.0–40.0)

## 2015-05-08 LAB — LUTEINIZING HORMONE: LH: 32.9 m[IU]/mL

## 2015-05-08 LAB — FOLLICLE STIMULATING HORMONE: FSH: 61.2 m[IU]/mL

## 2015-05-08 MED ORDER — ALPRAZOLAM ER 0.5 MG PO TB24: 0.5000 mg | ORAL_TABLET | Freq: Every day | ORAL | Status: DC

## 2015-05-08 NOTE — Progress Notes (Signed)
Pre-visit discussion using our clinic review tool. No additional management support is needed unless otherwise documented below in the visit note.  

## 2015-05-08 NOTE — Progress Notes (Signed)
Patient ID: Mary Bryant, female    DOB: [birth year]  Age: 49 y.o. MRN: [id number]  The patient is here for annual wellness examination and management of other chronic and acute problems.  Breast biopsy Dec 2015 was benign, by  [facility].   follow up with 6 month [facility] mammogram WAS NOT DONE PER PATIENT  HYSTERECTOMY AT AGE 81  NO CERVIX   The risk factors are reflected in the social history.  The roster of all physicians providing medical care to patient - is listed in the Snapshot section of the chart.  Home safety : The patient has smoke detectors in the home. They wear seatbelts.  There are no firearms at home. There is no violence in the home.   There is no risks for hepatitis, STDs or HIV. There is no   history of blood transfusion. They have no travel history to infectious disease endemic areas of the world.  The patient has seen their dentist in the last six month. They have not seen their eye doctor in the last year.   They do not  have excessive sun exposure. Discussed the need for sun protection: hats, long sleeves and use of sunscreen if there is significant sun exposure.   Diet: the importance of a healthy diet is discussed. They do have a healthy diet.  The benefits of regular aerobic exercise were discussed. She is not exercisisng and has gained 30 lbs in the past year.  Previous weight loss was unintentional,  But welcome and needed to achieve normal BMI.   Depression screen: there are no signs or vegative symptoms of depression- irritability, change in appetite, anhedonia, sadness/tearfullness.  The following portions of the patient's history were reviewed and updated as appropriate: allergies, current medications, past family history, past medical history,  past surgical history, past social history  and problem list.  Visual acuity was not assessed per patient preference since she has regular follow up with her ophthalmologist. Hearing and body mass index were assessed and  reviewed.   During the course of the visit the patient was educated and counseled about appropriate screening and preventive services including : fall prevention , diabetes screening, nutrition counseling, colorectal cancer screening, and recommended immunizations.    CC: The primary encounter diagnosis was S/P total hysterectomy. Diagnoses of Menopausal hot flushes, Mood changes (HCC), Anxiety disorder, unspecified anxiety disorder type, Screen for STD (sexually transmitted disease), Obesity, Encounter for preventive health examination, Generalized anxiety disorder, and Breast cancer screening were also pertinent to this visit.  Feeling anxious frequently,  Notices hands shake all the time. Her supervisor has commented on a change in mood including irritability and mood lability, and she notes that she gets easily rattled . She decided to resume  lexapro 1-2 months ago. The medication has helped her insomnia but not the feeling during the day. Wakes up feeling tremulous,  No improvement with food.    History Laysha has a past medical history of Hypertension; Insomnia; Esophageal stricture; and rhinitis allergic.   She has past surgical history that includes Cholecystectomy (2008) and Abdominal hysterectomy (2000).   Her family history includes COPD in her father; Cancer in her father; Coronary artery disease in her father; Diabetes in her mother; Diabetes type II in her father; Early death in her paternal grandmother; Heart disease in her paternal grandmother; Heart disease (age of onset: 41) in her father; Hyperlipidemia in her mother; Hypertension in her mother.She reports that she has never smoked. She has never used  smokeless tobacco. She reports that she does not drink alcohol or use illicit drugs.  Outpatient Prescriptions Prior to Visit  Medication Sig Dispense Refill  . acetaminophen (TYLENOL) 500 MG tablet Take 1,000 mg by mouth every 6 (six) hours as needed for mild pain.    Marland Kitchen  acyclovir (ZOVIRAX) 400 MG tablet Take 1 tablet (400 mg total) by mouth daily as needed (fever blisters). 30 tablet 0  . acyclovir ointment (ZOVIRAX) 5 % Apply 1 application topically every 3 (three) hours. 5 g 1  . carbamazepine (TEGRETOL) 200 MG tablet Take 200 mg by mouth 2 (two) times daily.    . cyanocobalamin 1000 MCG tablet Take 100 mcg by mouth daily.    Marland Kitchen escitalopram (LEXAPRO) 10 MG tablet Take 1 tablet (10 mg total) by mouth daily. 30 tablet 1  . HYDROcodone-acetaminophen (NORCO/VICODIN) 5-325 MG per tablet Take 1 tablet by mouth every 6 (six) hours as needed for moderate pain. 30 tablet 0  . metoprolol succinate (TOPROL-XL) 25 MG 24 hr tablet TAKE 1 TABLET (25 MG TOTAL) BY MOUTH DAILY. 90 tablet 1  . ondansetron (ZOFRAN) 4 MG tablet Take 1 tablet (4 mg total) by mouth every 8 (eight) hours as needed for nausea or vomiting. 20 tablet 0  . ondansetron (ZOFRAN) 4 MG tablet Take 1 tablet (4 mg total) by mouth every 8 (eight) hours as needed for nausea or vomiting. 20 tablet 0  . clonazePAM (KLONOPIN) 0.5 MG tablet TAKE 1 TABLET BY MOUTH TWICE A DAY AS NEEDED FOR ANXIETY 60 tablet 0   No facility-administered medications prior to visit.    Review of Systems   Patient denies headache, fevers, malaise, unintentional weight loss, skin rash, eye pain, sinus congestion and sinus pain, sore throat, dysphagia,  hemoptysis , cough, dyspnea, wheezing, chest pain, palpitations, orthopnea, edema, abdominal pain, nausea, melena, diarrhea, constipation, flank pain, dysuria, hematuria, urinary  Frequency, nocturia, numbness, tingling, seizures,  Focal weakness, Loss of consciousness,  Tremor, insomnia, depression, anxiety, and suicidal ideation.     Objective:  BP 126/78 mmHg  Pulse 77  Temp(Src) 98.5 F (36.9 C) (Oral)  Resp 14  Ht 5' 6.75" (1.695 m)  Wt 196 lb 8 oz (89.132 kg)  BMI 31.02 kg/m2  SpO2 98%  Physical Exam   General appearance: alert, cooperative and appears stated age Head:  Normocephalic, without obvious abnormality, atraumatic Eyes: conjunctivae/corneas clear. PERRL, EOM's intact. Fundi benign. Ears: normal TM's and external ear canals both ears Nose: Nares normal. Septum midline. Mucosa normal. No drainage or sinus tenderness. Throat: lips, mucosa, and tongue normal; teeth and gums normal Neck: no adenopathy, no carotid bruit, no JVD, supple, symmetrical, trachea midline and thyroid not enlarged, symmetric, no tenderness/mass/nodules Lungs: clear to auscultation bilaterally Breasts: normal appearance, no masses or tenderness Heart: regular rate and rhythm, S1, S2 normal, no murmur, click, rub or gallop Abdomen: soft, non-tender; bowel sounds normal; no masses,  no organomegaly Extremities: extremities normal, atraumatic, no cyanosis or edema Pulses: 2+ and symmetric Skin: Skin color, texture, turgor normal. No rashes or lesions Neurologic: Alert and oriented X 3, normal strength and tone. Normal symmetric reflexes. Normal coordination and gait.     Assessment & Plan:   Problem List Items Addressed This Visit    Generalized anxiety disorder    Continue lexapro.  Adding alprazolam ER for management of tremor..      Encounter for preventive health examination    Annual wellness  exam was done as well as a comprehensive physical  exam  .  During the course of the visit the patient was educated and counseled about appropriate screening and preventive services and screenings were brought up to date for breast cancer .  She will return for fasting labs to provide samples for diabetes screening and lipid analysis with projected  10 year  risk for CAD. nutrition counseling, skin cancer screening has been recommended, along with review of the age appropriate recommended immunizations.  Printed recommendations for health maintenance screenings was given.        Menopausal hot flushes    Checking FSH/LH as patient is s/p hysterectomy remotely.       Relevant  Orders   Luteinizing hormone (Completed)   Follicle stimulating hormone (Completed)   TSH (Completed)   S/P total hysterectomy - Primary    Other Visit Diagnoses    Mood changes ([place])        Anxiety disorder, unspecified anxiety disorder type        Screen for STD (sexually transmitted disease)        Relevant Orders    HIV antibody (Completed)    Hepatitis C antibody (Completed)    Obesity        Relevant Orders    Lipid panel (Completed)    Breast cancer screening        Relevant Orders    MM DIGITAL SCREENING BILATERAL       I have discontinued Ms. Cronce's clonazePAM. I am also having her start on ALPRAZolam. Additionally, I am having her maintain her acetaminophen, ondansetron, cyanocobalamin, acyclovir ointment, acyclovir, carbamazepine, metoprolol succinate, escitalopram, ondansetron, and HYDROcodone-acetaminophen.  Meds ordered this encounter  Medications  . ALPRAZolam (XANAX XR) 0.5 MG 24 hr tablet    Sig: Take 1 tablet (0.5 mg total) by mouth daily.    Dispense:  30 tablet    Refill:  1    Medications Discontinued During This Encounter  Medication Reason  . clonazePAM (KLONOPIN) 0.5 MG tablet     Follow-up: No Follow-up on file.   Crecencio Mc, MD

## 2015-05-08 NOTE — Patient Instructions (Signed)
I  Will order your mammogram at [facility] per Dr Dwyane Luo recommendations  Trial of alprazolam ER,  One tablet daily in AM  If no improvement,  We will consider a trial of hormone therapy  I want you to lose 20 lbs over the next 6 months Menopause is a normal process in which your reproductive ability comes to an end. This process happens gradually over a span of months to years, usually between the ages of 23 and 58. Menopause is complete when you have missed 12 consecutive menstrual periods. It is important to talk with your health care provider about some of the most common conditions that affect postmenopausal women, such as heart disease, cancer, and bone loss (osteoporosis). Adopting a healthy lifestyle and getting preventive care can help to promote your health and wellness. Those actions can also lower your chances of developing some of these common conditions. WHAT SHOULD I KNOW ABOUT MENOPAUSE? During menopause, you may experience a number of symptoms, such as:  Moderate-to-severe hot flashes.  Night sweats.  Decrease in sex drive.  Mood swings.  Headaches.  Tiredness.  Irritability.  Memory problems.  Insomnia. Choosing to treat or not to treat menopausal changes is an individual decision that you make with your health care provider. WHAT SHOULD I KNOW ABOUT HORMONE REPLACEMENT THERAPY AND SUPPLEMENTS? Hormone therapy products are effective for treating symptoms that are associated with menopause, such as hot flashes and night sweats. Hormone replacement carries certain risks, especially as you become older. If you are thinking about using estrogen or estrogen with progestin treatments, discuss the benefits and risks with your health care provider. WHAT SHOULD I KNOW ABOUT HEART DISEASE AND STROKE? Heart disease, heart attack, and stroke become more likely as you age. This may be due, in part, to the hormonal changes that your body experiences during menopause. These can affect  how your body processes dietary fats, triglycerides, and cholesterol. Heart attack and stroke are both medical emergencies. There are many things that you can do to help prevent heart disease and stroke:  Have your blood pressure checked at least every 1-2 years. High blood pressure causes heart disease and increases the risk of stroke.  If you are 26-9 years old, ask your health care provider if you should take aspirin to prevent a heart attack or a stroke.  Do not use any tobacco products, including cigarettes, chewing tobacco, or electronic cigarettes. If you need help quitting, ask your health care provider.  It is important to eat a healthy diet and maintain a healthy weight.  Be sure to include plenty of vegetables, fruits, low-fat dairy products, and lean protein.  Avoid eating foods that are high in solid fats, added sugars, or salt (sodium).  Get regular exercise. This is one of the most important things that you can do for your health.  Try to exercise for at least 150 minutes each week. The type of exercise that you do should increase your heart rate and make you sweat. This is known as moderate-intensity exercise.  Try to do strengthening exercises at least twice each week. Do these in addition to the moderate-intensity exercise.  Know your numbers.Ask your health care provider to check your cholesterol and your blood glucose. Continue to have your blood tested as directed by your health care provider. WHAT SHOULD I KNOW ABOUT CANCER SCREENING? There are several types of cancer. Take the following steps to reduce your risk and to catch any cancer development as early as possible. Breast  Cancer  Practice breast self-awareness.  This means understanding how your breasts normally appear and feel.  It also means doing regular breast self-exams. Let your health care provider know about any changes, no matter how small.  If you are 9 or older, have a clinician do a breast  exam (clinical breast exam or CBE) every year. Depending on your age, family history, and medical history, it may be recommended that you also have a yearly breast X-ray (mammogram).  If you have a family history of breast cancer, talk with your health care provider about genetic screening.  If you are at high risk for breast cancer, talk with your health care provider about having an MRI and a mammogram every year.  Breast cancer (BRCA) gene test is recommended for women who have family members with BRCA-related cancers. Results of the assessment will determine the need for genetic counseling and BRCA1 and for BRCA2 testing. BRCA-related cancers include these types:  Breast. This occurs in males or females.  Ovarian.  Tubal. This may also be called fallopian tube cancer.  Cancer of the abdominal or pelvic lining (peritoneal cancer).  Prostate.  Pancreatic. Cervical, Uterine, and Ovarian Cancer Your health care provider may recommend that you be screened regularly for cancer of the pelvic organs. These include your ovaries, uterus, and vagina. This screening involves a pelvic exam, which includes checking for microscopic changes to the surface of your cervix (Pap test).  For women ages 21-65, health care providers may recommend a pelvic exam and a Pap test every three years. For women ages 39-65, they may recommend the Pap test and pelvic exam, combined with testing for human papilloma virus (HPV), every five years. Some types of HPV increase your risk of cervical cancer. Testing for HPV may also be done on women of any age who have unclear Pap test results.  Other health care providers may not recommend any screening for nonpregnant women who are considered low risk for pelvic cancer and have no symptoms. Ask your health care provider if a screening pelvic exam is right for you.  If you have had past treatment for cervical cancer or a condition that could lead to cancer, you need Pap tests  and screening for cancer for at least 20 years after your treatment. If Pap tests have been discontinued for you, your risk factors (such as having a new sexual partner) need to be reassessed to determine if you should start having screenings again. Some women have medical problems that increase the chance of getting cervical cancer. In these cases, your health care provider may recommend that you have screening and Pap tests more often.  If you have a family history of uterine cancer or ovarian cancer, talk with your health care provider about genetic screening.  If you have vaginal bleeding after reaching menopause, tell your health care provider.  There are currently no reliable tests available to screen for ovarian cancer. Lung Cancer Lung cancer screening is recommended for adults 44-108 years old who are at high risk for lung cancer because of a history of smoking. A yearly low-dose CT scan of the lungs is recommended if you:  Currently smoke.  Have a history of at least 30 pack-years of smoking and you currently smoke or have quit within the past 15 years. A pack-year is smoking an average of one pack of cigarettes per day for one year. Yearly screening should:  Continue until it has been 15 years since you quit.  Stop  if you develop a health problem that would prevent you from having lung cancer treatment. Colorectal Cancer  This type of cancer can be detected and can often be prevented.  Routine colorectal cancer screening usually begins at age 18 and continues through age 65.  If you have risk factors for colon cancer, your health care provider may recommend that you be screened at an earlier age.  If you have a family history of colorectal cancer, talk with your health care provider about genetic screening.  Your health care provider may also recommend using home test kits to check for hidden blood in your stool.  A small camera at the end of a tube can be used to examine your  colon directly (sigmoidoscopy or colonoscopy). This is done to check for the earliest forms of colorectal cancer.  Direct examination of the colon should be repeated every 5-10 years until age 71. However, if early forms of precancerous polyps or small growths are found or if you have a family history or genetic risk for colorectal cancer, you may need to be screened more often. Skin Cancer  Check your skin from head to toe regularly.  Monitor any moles. Be sure to tell your health care provider:  About any new moles or changes in moles, especially if there is a change in a mole's shape or color.  If you have a mole that is larger than the size of a pencil eraser.  If any of your family members has a history of skin cancer, especially at a young age, talk with your health care provider about genetic screening.  Always use sunscreen. Apply sunscreen liberally and repeatedly throughout the day.  Whenever you are outside, protect yourself by wearing long sleeves, pants, a wide-brimmed hat, and sunglasses. WHAT SHOULD I KNOW ABOUT OSTEOPOROSIS? Osteoporosis is a condition in which bone destruction happens more quickly than new bone creation. After menopause, you may be at an increased risk for osteoporosis. To help prevent osteoporosis or the bone fractures that can happen because of osteoporosis, the following is recommended:  If you are 75-35 years old, get at least 1,000 mg of calcium and at least 600 mg of vitamin D per day.  If you are older than age 39 but younger than age 20, get at least 1,200 mg of calcium and at least 600 mg of vitamin D per day.  If you are older than age 84, get at least 1,200 mg of calcium and at least 800 mg of vitamin D per day. Smoking and excessive alcohol intake increase the risk of osteoporosis. Eat foods that are rich in calcium and vitamin D, and do weight-bearing exercises several times each week as directed by your health care provider. WHAT SHOULD I  KNOW ABOUT HOW MENOPAUSE AFFECTS [place]? Depression may occur at any age, but it is more common as you become older. Common symptoms of depression include:  Low or sad mood.  Changes in sleep patterns.  Changes in appetite or eating patterns.  Feeling an overall lack of motivation or enjoyment of activities that you previously enjoyed.  Frequent crying spells. Talk with your health care provider if you think that you are experiencing depression. WHAT SHOULD I KNOW ABOUT IMMUNIZATIONS? It is important that you get and maintain your immunizations. These include:  Tetanus, diphtheria, and pertussis (Tdap) booster vaccine.  Influenza every year before the flu season begins.  Pneumonia vaccine.  Shingles vaccine. Your health care provider may also recommend other  immunizations.   This information is not intended to replace advice given to you by your health care provider. Make sure you discuss any questions you have with your health care provider.   Document Released: 08/27/2005 Document Revised: 07/26/2014 Document Reviewed: 03/07/2014 Elsevier Interactive Patient Education [employer].

## 2015-05-09 LAB — HEPATITIS C ANTIBODY: HCV AB: NEGATIVE

## 2015-05-09 LAB — HIV ANTIBODY (ROUTINE TESTING W REFLEX): HIV: NONREACTIVE

## 2015-05-10 DIAGNOSIS — N951 Menopausal and female climacteric states: Secondary | ICD-10-CM | POA: Insufficient documentation

## 2015-05-10 DIAGNOSIS — Z0000 Encounter for general adult medical examination without abnormal findings: Secondary | ICD-10-CM | POA: Insufficient documentation

## 2015-05-10 NOTE — Assessment & Plan Note (Signed)
Continue lexapro.  Adding alprazolam ER for management of tremor.Marland Kitchen

## 2015-05-10 NOTE — Assessment & Plan Note (Signed)
Checking FSH/LH as patient is s/p hysterectomy remotely.

## 2015-05-10 NOTE — Assessment & Plan Note (Signed)
Annual wellness  exam was done as well as a comprehensive physical exam  .  During the course of the visit the patient was educated and counseled about appropriate screening and preventive services and screenings were brought up to date for breast cancer .  She will return for fasting labs to provide samples for diabetes screening and lipid analysis with projected  10 year  risk for CAD. nutrition counseling, skin cancer screening has been recommended, along with review of the age appropriate recommended immunizations.  Printed recommendations for health maintenance screenings was given.

## 2015-05-11 ENCOUNTER — Encounter: Payer: Self-pay | Admitting: Internal Medicine

## 2015-05-13 ENCOUNTER — Other Ambulatory Visit: Payer: Self-pay | Admitting: Internal Medicine

## 2015-06-11 ENCOUNTER — Other Ambulatory Visit: Payer: Self-pay

## 2015-06-11 ENCOUNTER — Telehealth: Payer: Self-pay

## 2015-06-11 NOTE — Telephone Encounter (Signed)
Pt would like to know if you could write her a note for work stating she has Trigeminal Neuralgia of left face. She states this has cause some problems on her job do to her facial expression.

## 2015-06-16 NOTE — Telephone Encounter (Signed)
Pt will call back to make appt

## 2015-06-16 NOTE — Telephone Encounter (Signed)
She would have to be seen

## 2015-07-09 ENCOUNTER — Other Ambulatory Visit: Payer: Self-pay | Admitting: Internal Medicine

## 2015-07-09 MED ORDER — ALPRAZOLAM ER 0.5 MG PO TB24: 0.5000 mg | ORAL_TABLET | Freq: Every day | ORAL | Status: DC

## 2015-07-09 NOTE — Telephone Encounter (Signed)
Her last rx was for alprazolam XR,  Which is what I refilled.  Callpatient  to clarify before releasing

## 2015-07-19 ENCOUNTER — Other Ambulatory Visit: Payer: Self-pay | Admitting: Internal Medicine

## 2015-08-20 ENCOUNTER — Other Ambulatory Visit: Payer: Self-pay | Admitting: Internal Medicine

## 2015-09-02 ENCOUNTER — Telehealth: Payer: Self-pay | Admitting: Internal Medicine

## 2015-09-02 MED ORDER — ESCITALOPRAM OXALATE 10 MG PO TABS: ORAL_TABLET | ORAL | Status: DC

## 2015-09-02 NOTE — Telephone Encounter (Signed)
Pt called about no more refills for escitalopram (LEXAPRO) 10 MG tablet. Pharmacy is [id number] - [place], [place] [address]. Pt has an appt on 09/17/15 @ 4pm. Call pt @ [phone]. Thank you!

## 2015-09-02 NOTE — Telephone Encounter (Signed)
Request sent for refill.

## 2015-09-12 ENCOUNTER — Other Ambulatory Visit: Payer: Self-pay | Admitting: Internal Medicine

## 2015-09-12 NOTE — Telephone Encounter (Signed)
Ok to refill,  printed rx  

## 2015-09-17 ENCOUNTER — Encounter: Payer: Self-pay | Admitting: Internal Medicine

## 2015-09-17 ENCOUNTER — Ambulatory Visit (INDEPENDENT_AMBULATORY_CARE_PROVIDER_SITE_OTHER): Payer: 59 | Admitting: Internal Medicine

## 2015-09-17 VITALS — BP 120/84 | HR 86 | Temp 98.2°F | Resp 12 | Ht 67.0 in | Wt 199.5 lb

## 2015-09-17 DIAGNOSIS — I2510 Atherosclerotic heart disease of native coronary artery without angina pectoris: Secondary | ICD-10-CM | POA: Diagnosis not present

## 2015-09-17 DIAGNOSIS — E663 Overweight: Secondary | ICD-10-CM | POA: Diagnosis not present

## 2015-09-17 DIAGNOSIS — R635 Abnormal weight gain: Secondary | ICD-10-CM

## 2015-09-17 DIAGNOSIS — T50905A Adverse effect of unspecified drugs, medicaments and biological substances, initial encounter: Principal | ICD-10-CM

## 2015-09-17 DIAGNOSIS — R258 Other abnormal involuntary movements: Secondary | ICD-10-CM | POA: Diagnosis not present

## 2015-09-17 DIAGNOSIS — R251 Tremor, unspecified: Secondary | ICD-10-CM

## 2015-09-17 NOTE — Progress Notes (Signed)
Pre-visit discussion using our clinic review tool. No additional management support is needed unless otherwise documented below in the visit note.  

## 2015-09-17 NOTE — Patient Instructions (Signed)
This is my  example of a  "Low GI"  Diet:  It will allow you to lose 4 to 8  lbs  per month if you follow it carefully.  Your goal with exercise is a minimum of 30 minutes of aerobic exercise 5 days per week (Walking does not count once it becomes easy!)    All of the foods can be found at grocery stores and in bulk at [employer].  The Atkins protein bars and shakes are available in more varieties at [employer], [employer] and [place].     7 AM Breakfast:  Choose from the following:  Low carbohydrate Protein  Shakes (I recommend the  Premier Protein chocolate shake, s EAS AdvantEdge "Carb Control" shakes  Or the low carb shakes by Atkins.    2.5 carbs)   Arnold's "Sandwhich Thin"toasted  w/ peanut butter (no jelly: about 20 net carbs  "Bagel Thin" with cream cheese and salmon: about 20 carbs   a scrambled egg/bacon/cheese burrito made with Mission's "carb balance" whole wheat tortilla  (about 10 net carbs )  Regulatory affairs officer (basically a quiche without the pastry crust) that is eaten cold and very convenient way to get your eggs  If you make your own shakes, avoid bananas and pineapple,  And use low carb greek yogurt or almond milk    Avoid cereal and bananas, oatmeal and cream of wheat and grits. They are loaded with carbohydrates!   10 AM: high protein snack:  Protein bar by Atkins (the snack size, under 200 cal, usually < 6 net carbs).    A stick of cheese:  Around 1 carb,  100 cal     Dannon Light n Fit [place] Yogurt  (80 cal, 8 carbs)  Other so called "protein bars" and Greek yogurts tend to be loaded with carbohydrates.  Remember, in food advertising, the word "energy" is synonymous for " carbohydrate."  Lunch:   A Sandwich using the bread choices listed, Can use any  Eggs,  lunchmeat, grilled meat or canned tuna), avocado, regular mayo/mustard  and cheese.  A Salad using blue cheese, ranch,  Goddess or vinagrette,  Avoid taco shells, croutons or "confetti" and no  "candied nuts" but regular nuts OK.   No pretzels, nabs  or chips.  Pickles and miniature sweet peppers are a good low carb alternative that provide a "crunch"  The bread is the only source of carbohydrate in a sandwich and  can be decreased by trying some of these alternatives to traditional loaf bread  Joseph's makes a pita bread and a flat bread that are 50 cal and 4 net carbs available at [place] and [place].  This can be toasted to use with hummous as well  Toufayan makes a low carb flatbread that's 100 cal and 9 net carbs available at [employer] and [employer]'s makes 2 sizes of  Low carb whole wheat tortilla  (The large one is 210 cal and 6 net carbs)  Ezekiel bread is a loaf bread sold in the frozen section of higher end grocery chains,  Very low carb  Avoid "Low fat dressings, as well as Barry Brunner and [place] dressings They are loaded with sugar!   3 PM/ Mid day  Snack:  Consider  1 ounce of  almonds, walnuts, pistachios, pecans, peanuts,  Macadamia nuts or a nut medley.  Avoid "granola"; the dried cranberries and raisins are loaded with carbohydrates. Mixed nuts as long as there  are no raisins,  cranberries or dried fruit.    Try the prosciutto/mozzarella cheese sticks by Fiorruci  In deli /backery section   High protein      6 PM  Dinner:     Meat/fowl/fish with a green salad, and either broccoli, cauliflower, green beans, spinach, brussel sprouts or  Lima beans. DO NOT BREAD THE PROTEIN!!      There is a low carb pasta by Dreamfield's that is acceptable and tastes great: only 5 digestible carbs/serving.( All grocery stores but BJs carry it )  Try Hurley Cisco Angelo's chicken piccata or chicken or eggplant parm over low carb pasta.(Lowes and BJs)   Marjory Lies Sanchez's "Carnitas" (pulled pork, no sauce,  0 carbs) or his beef pot roast to make a dinner burrito (at [employer]J's)  Pesto over low carb pasta (bj's sells a good quality pesto in the center refrigerated section of the deli   Try satueeing   Cheral Marker with fresh ginger/mushroooms as a great side dish   Whole wheat pasta is still full of digestible carbs and  Not as low in glycemic index as Dreamfield's.   Brown rice is still rice,  So skip the rice and noodles if you eat [place] or [place] (or at least limit to 1/2 cup)  9 PM snack :   Breyer's "low carb" fudgsicle or  ice cream bar (Carb Smart line), or  Weight Watcher's ice cream bar , or another "no sugar added" ice cream;  a serving of fresh berries/cherries with whipped cream   Cheese or DANNON'S LlGHT N FIT GREEK YOGURT  8 ounces of [place] unsweetened almond/cococunut milk    Treat yourself to a parfait made with whipped cream blueberiies, walnuts and vanilla greek yogurt  Avoid bananas, pineapple, grapes  and watermelon on a regular basis because they are high in sugar.  THINK OF THEM AS DESSERT  Remember that snack Substitutions should be less than 10 NET carbs per serving and meals < 20 carbs. Remember to subtract fiber grams to get the "net carbs."

## 2015-09-17 NOTE — Progress Notes (Signed)
Subjective:  Patient ID: Mary Bryant, female    DOB: 1966/02/01  Age: 50 y.o. MRN: [id number]  CC: The primary encounter diagnosis was Weight gain due to medication. Diagnoses of Overweight (BMI 25.0-29.9), Occasional tremors, and CAD in native artery were also pertinent to this visit.  HPI Mary Bryant presents for follow up on anxiety with tremor.  She was last  seen in Oct for wellness exam. Alprazolam ER was started and clonazepam stopped .  She reports that her anxiety and tremor are  Better controlled on current regimen.  Sh eis sleeping better,  And functioning well at work and at home.   She is dismayed by her weight gain.  She is not exercising due to her work schedule and not following a particular diet.  Often skips meals.  Diet and exercise regimen discussed.   She underwent noninvasive cardiac testing for evaluation of atypical chest paain and dyspnea, with a Normal myoview Nov 2016 per  Dr. Clayborn Bigness   Outpatient Prescriptions Prior to Visit  Medication Sig Dispense Refill  . acetaminophen (TYLENOL) 500 MG tablet Take 1,000 mg by mouth every 6 (six) hours as needed for mild pain.    Marland Kitchen acyclovir (ZOVIRAX) 400 MG tablet Take 1 tablet (400 mg total) by mouth daily as needed (fever blisters). 30 tablet 0  . acyclovir ointment (ZOVIRAX) 5 % Apply 1 application topically every 3 (three) hours. 5 g 1  . ALPRAZolam (XANAX XR) 0.5 MG 24 hr tablet Take 1 tablet (0.5 mg total) by mouth daily. 30 tablet 1  . ALPRAZolam (XANAX) 0.5 MG tablet TAKE 1 TABLET BY MOUTH ONCE DAILY 30 tablet 1  . carbamazepine (TEGRETOL) 200 MG tablet TAKE 1 TABLET (200 MG TOTAL) BY MOUTH 3 (THREE) TIMES DAILY. 90 tablet 3  . cyanocobalamin 1000 MCG tablet Take 100 mcg by mouth daily.    Marland Kitchen escitalopram (LEXAPRO) 10 MG tablet TAKE 1 TABLET (10 MG TOTAL) BY MOUTH DAILY. 30 tablet 1  . HYDROcodone-acetaminophen (NORCO/VICODIN) 5-325 MG per tablet Take 1 tablet by mouth every 6 (six) hours as needed for  moderate pain. 30 tablet 0  . metoprolol succinate (TOPROL-XL) 25 MG 24 hr tablet TAKE 1 TABLET (25 MG TOTAL) BY MOUTH DAILY. 90 tablet 1  . metoprolol succinate (TOPROL-XL) 25 MG 24 hr tablet TAKE 1 TABLET (25 MG TOTAL) BY MOUTH DAILY. 90 tablet 1  . ondansetron (ZOFRAN) 4 MG tablet Take 1 tablet (4 mg total) by mouth every 8 (eight) hours as needed for nausea or vomiting. 20 tablet 0  . ondansetron (ZOFRAN) 4 MG tablet Take 1 tablet (4 mg total) by mouth every 8 (eight) hours as needed for nausea or vomiting. 20 tablet 0  . carbamazepine (TEGRETOL) 200 MG tablet Take 200 mg by mouth 2 (two) times daily. Reported on 09/17/2015     No facility-administered medications prior to visit.    Review of Systems;  Patient denies headache, fevers, malaise, unintentional weight loss, skin rash, eye pain, sinus congestion and sinus pain, sore throat, dysphagia,  hemoptysis , cough, dyspnea, wheezing, chest pain, palpitations, orthopnea, edema, abdominal pain, nausea, melena, diarrhea, constipation, flank pain, dysuria, hematuria, urinary  Frequency, nocturia, numbness, tingling, seizures,  Focal weakness, Loss of consciousness,  Tremor, insomnia, depression, anxiety, and suicidal ideation.      Objective:  BP 120/84 mmHg  Pulse 86  Temp(Src) 98.2 F (36.8 C) (Oral)  Resp 12  Ht 5\' 7"  (1.702 m)  Wt 199 lb 8 oz (  90.493 kg)  BMI 31.24 kg/m2  SpO2 96%  BP Readings from Last 3 Encounters:  09/17/15 120/84  05/08/15 126/78  04/04/15 130/98    Wt Readings from Last 3 Encounters:  09/17/15 199 lb 8 oz (90.493 kg)  05/08/15 196 lb 8 oz (89.132 kg)  04/04/15 196 lb 8 oz (89.132 kg)    General appearance: alert, cooperative and appears stated age Ears: normal TM's and external ear canals both ears Throat: lips, mucosa, and tongue normal; teeth and gums normal Neck: no adenopathy, no carotid bruit, supple, symmetrical, trachea midline and thyroid not enlarged, symmetric, no  tenderness/mass/nodules Back: symmetric, no curvature. ROM normal. No CVA tenderness. Lungs: clear to auscultation bilaterally Heart: regular rate and rhythm, S1, S2 normal, no murmur, click, rub or gallop Abdomen: soft, non-tender; bowel sounds normal; no masses,  no organomegaly Pulses: 2+ and symmetric Skin: Skin color, texture, turgor normal. No rashes or lesions Lymph nodes: Cervical, supraclavicular, and axillary nodes normal.  Lab Results  Component Value Date   HGBA1C 5.7 05/19/2011    Lab Results  Component Value Date   CREATININE 0.82 04/04/2015   CREATININE 0.88 12/14/2014   CREATININE 0.9 04/24/2014    Lab Results  Component Value Date   WBC 5.9 04/04/2015   HGB 13.7 04/04/2015   HCT 41.5 04/04/2015   PLT 300.0 04/04/2015   GLUCOSE 83 04/04/2015   CHOL 225* 05/08/2015   TRIG 111.0 05/08/2015   HDL 75.60 05/08/2015   LDLCALC 127* 05/08/2015   ALT 12 04/04/2015   AST 16 04/04/2015   NA 140 04/04/2015   K 4.4 04/04/2015   CL 103 04/04/2015   CREATININE 0.82 04/04/2015   BUN 11 04/04/2015   CO2 29 04/04/2015   TSH 1.68 05/08/2015   HGBA1C 5.7 05/19/2011   MICROALBUR 1.4 05/08/2012     Assessment & Plan:   Problem List Items Addressed This Visit    Overweight (BMI 25.0-29.9)    I have addressed  BMI and recommended wt loss of 10% of body weigh over the next 6 months using a low glycemic index diet and regular exercise a minimum of 5 days per week.        CAD in native artery    Apparently nonocclusive,  by recent diagnostic catheterization by Dwayne Callwood.  She underwent annual screening with a normal Myoview in November.  Se is advised to start at daily baby aspirin and return for fasting lipids  Lab Results  Component Value Date   CHOL 225* 05/08/2015   HDL 75.60 05/08/2015   LDLCALC 127* 05/08/2015   TRIG 111.0 05/08/2015   CHOLHDL 3 05/08/2015          Occasional tremors    Improved with treatment of anxiety.  No changes today.  The  risks and benefits of benzodiazepine use were reviewed with patient today including excessive sedation leading to respiratory depression,  impaired thinking/driving, and addiction.  Patient was advised to avoid concurrent use with alcohol, to use medication only as needed and not to share with others  .       RESOLVED: Weight gain due to medication - Primary     I have addressed weight gain and sedentary lifestyle and strongly advised a natural nonpharmacologic approach to weight loss  using a low glycemic index diet and regular exercise a minimum of 5 days per week.            A total of 25 minutes of face to face time  was spent with patient more than half of which was spent in counselling about the above mentioned conditions  and coordination of care   I am having Ms. Kunzler maintain her acetaminophen, ondansetron, cyanocobalamin, acyclovir ointment, acyclovir, carbamazepine, metoprolol succinate, ondansetron, HYDROcodone-acetaminophen, carbamazepine, ALPRAZolam, metoprolol succinate, escitalopram, and ALPRAZolam.  No orders of the defined types were placed in this encounter.    There are no discontinued medications.  Follow-up: Return in about 6 months (around 03/19/2016).   Crecencio Mc, MD

## 2015-09-20 NOTE — Assessment & Plan Note (Signed)
I have addressed weight gain and sedentary lifestyle and strongly advised a natural nonpharmacologic approach to weight loss  using a low glycemic index diet and regular exercise a minimum of 5 days per week.

## 2015-09-20 NOTE — Assessment & Plan Note (Signed)
Improved with treatment of anxiety.  No changes today.  The risks and benefits of benzodiazepine use were reviewed with patient today including excessive sedation leading to respiratory depression,  impaired thinking/driving, and addiction.  Patient was advised to avoid concurrent use with alcohol, to use medication only as needed and not to share with others  .

## 2015-09-20 NOTE — Assessment & Plan Note (Signed)
Apparently nonocclusive,  by recent diagnostic catheterization by Dwayne Callwood.  She underwent annual screening with a normal Myoview in November.  Se is advised to start at daily baby aspirin and return for fasting lipids  Lab Results  Component Value Date   CHOL 225* 05/08/2015   HDL 75.60 05/08/2015   LDLCALC 127* 05/08/2015   TRIG 111.0 05/08/2015   CHOLHDL 3 05/08/2015

## 2015-09-20 NOTE — Assessment & Plan Note (Signed)
I have addressed  BMI and recommended wt loss of 10% of body weigh over the next 6 months using a low glycemic index diet and regular exercise a minimum of 5 days per week.   

## 2015-10-18 ENCOUNTER — Other Ambulatory Visit: Payer: Self-pay | Admitting: Internal Medicine

## 2015-11-25 ENCOUNTER — Other Ambulatory Visit: Payer: Self-pay | Admitting: Nurse Practitioner

## 2015-12-03 ENCOUNTER — Other Ambulatory Visit: Payer: Self-pay | Admitting: Internal Medicine

## 2015-12-11 ENCOUNTER — Other Ambulatory Visit: Payer: Self-pay | Admitting: Internal Medicine

## 2015-12-11 NOTE — Telephone Encounter (Signed)
Medication phoned to pharmacy.  

## 2016-01-27 ENCOUNTER — Encounter: Payer: Self-pay | Admitting: Internal Medicine

## 2016-02-25 ENCOUNTER — Emergency Department
Admission: EM | Admit: 2016-02-25 | Discharge: 2016-02-26 | Disposition: A | Discharge status: Home / Self Care | Payer: 59 | Attending: Emergency Medicine | Admitting: Emergency Medicine

## 2016-02-25 ENCOUNTER — Encounter: Payer: Self-pay | Admitting: Emergency Medicine

## 2016-02-25 DIAGNOSIS — Z79899 Other long term (current) drug therapy: Secondary | ICD-10-CM | POA: Insufficient documentation

## 2016-02-25 DIAGNOSIS — I2510 Atherosclerotic heart disease of native coronary artery without angina pectoris: Secondary | ICD-10-CM | POA: Diagnosis not present

## 2016-02-25 DIAGNOSIS — I10 Essential (primary) hypertension: Secondary | ICD-10-CM | POA: Diagnosis not present

## 2016-02-25 DIAGNOSIS — R1084 Generalized abdominal pain: Secondary | ICD-10-CM

## 2016-02-25 DIAGNOSIS — K449 Diaphragmatic hernia without obstruction or gangrene: Secondary | ICD-10-CM | POA: Insufficient documentation

## 2016-02-25 LAB — COMPREHENSIVE METABOLIC PANEL
ALBUMIN: 4.3 g/dL (ref 3.5–5.0)
ALT: 26 U/L (ref 14–54)
ANION GAP: 7 (ref 5–15)
AST: 25 U/L (ref 15–41)
Alkaline Phosphatase: 109 U/L (ref 38–126)
BUN: 10 mg/dL (ref 6–20)
CHLORIDE: 106 mmol/L (ref 101–111)
CO2: 28 mmol/L (ref 22–32)
CREATININE: 0.82 mg/dL (ref 0.44–1.00)
Calcium: 9.5 mg/dL (ref 8.9–10.3)
GFR calc non Af Amer: >60 mL/min (ref >60)
Glucose, Bld: 104 mg/dL — ABNORMAL HIGH (ref 65–99)
Potassium: 4 mmol/L (ref 3.5–5.1)
SODIUM: 141 mmol/L (ref 135–145)
Total Bilirubin: 0.2 mg/dL — ABNORMAL LOW (ref 0.3–1.2)
Total Protein: 8 g/dL (ref 6.5–8.1)

## 2016-02-25 LAB — URINALYSIS COMPLETE WITH MICROSCOPIC (ARMC ONLY)
Bacteria, UA: NONE SEEN
Bilirubin Urine: NEGATIVE
Glucose, UA: NEGATIVE mg/dL
Hgb urine dipstick: NEGATIVE
Ketones, ur: NEGATIVE mg/dL
Leukocytes, UA: NEGATIVE
Nitrite: NEGATIVE
PROTEIN: NEGATIVE mg/dL
Specific Gravity, Urine: 1.004 — ABNORMAL LOW (ref 1.005–1.030)
pH: 6 (ref 5.0–8.0)

## 2016-02-25 LAB — CBC
HCT: 41.2 % (ref 35.0–47.0)
HEMOGLOBIN: 14.1 g/dL (ref 12.0–16.0)
MCH: 30.1 pg (ref 26.0–34.0)
MCHC: 34.3 g/dL (ref 32.0–36.0)
MCV: 87.7 fL (ref 80.0–100.0)
Platelets: 300 10*3/uL (ref 150–440)
RBC: 4.7 MIL/uL (ref 3.80–5.20)
RDW: 13.7 % (ref 11.5–14.5)
WBC: 7.4 10*3/uL (ref 3.6–11.0)

## 2016-02-25 LAB — LIPASE, BLOOD: Lipase: 43 U/L (ref 11–51)

## 2016-02-25 NOTE — ED Triage Notes (Signed)
Pt comes in via POV c/o RUQ pain and RLQ pain over the last several weeks. Reports that if she eats she has nausea, liquids are ok. Describes the pain as sharp. Already has had her gallbladder removed. Denies any urinary symptoms.

## 2016-02-26 ENCOUNTER — Emergency Department: Payer: 59

## 2016-02-26 DIAGNOSIS — I2510 Atherosclerotic heart disease of native coronary artery without angina pectoris: Secondary | ICD-10-CM | POA: Diagnosis not present

## 2016-02-26 DIAGNOSIS — I10 Essential (primary) hypertension: Secondary | ICD-10-CM | POA: Diagnosis not present

## 2016-02-26 DIAGNOSIS — Z79899 Other long term (current) drug therapy: Secondary | ICD-10-CM | POA: Diagnosis not present

## 2016-02-26 DIAGNOSIS — K449 Diaphragmatic hernia without obstruction or gangrene: Secondary | ICD-10-CM | POA: Diagnosis not present

## 2016-02-26 MED ORDER — DIATRIZOATE MEGLUMINE & SODIUM 66-10 % PO SOLN
15.0000 mL | Freq: Once | ORAL | Status: AC
  Administered 2016-02-26: 15 mL via ORAL

## 2016-02-26 MED ORDER — ONDANSETRON HCL 4 MG/2ML IJ SOLN
4.0000 mg | INTRAMUSCULAR | Status: AC
  Administered 2016-02-26: 4 mg via INTRAVENOUS
  Filled 2016-02-26: qty 2

## 2016-02-26 MED ORDER — IOPAMIDOL (ISOVUE-300) INJECTION 61%
100.0000 mL | Freq: Once | INTRAVENOUS | Status: AC | PRN
  Administered 2016-02-26: 100 mL via INTRAVENOUS

## 2016-02-26 NOTE — ED Provider Notes (Signed)
[facility] Emergency Department Provider Note  ____________________________________________   First MD Initiated Contact with Patient 02/26/16 0005     (approximate)  I have reviewed the triage vital signs and the nursing notes.   HISTORY  Chief Complaint Abdominal Pain and Nausea    HPI Mary Bryant is a 50 y.o. female who presents for evaluation of right-sided and upper abdominal pain that has been present for the last several weeks.  She reports that it is intermittent and seems to be worse after she eats.  She takes omeprazole and uses Tums frequently but this does not seem to help.  She reports that after eating she also has nausea.  She describes the pain as both sharp and aching.  She had a cholecystectomy about 7 years ago but this type of pain is new over the last several weeks.  Nothing makes it better.  She denies fever/chills, chest pain, shortness of breath, vomiting, diarrhea, constipation.   Past Medical History:  Diagnosis Date  . Esophageal stricture    dilated 2009,  [place]  . Hypertension    no prior treatment  . Insomnia    chronic, no prior sleep study  . rhinitis allergic     Patient Active Problem List   Diagnosis Date Noted  . Encounter for preventive health examination 05/10/2015  . Menopausal hot flushes 05/10/2015  . S/P total hysterectomy 05/08/2015  . RLQ abdominal pain 04/04/2015  . 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 Long-term use of high-risk medication 04/21/2014  . Trigeminal neuralgia of left side of face 03/22/2014  . 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 Occasional tremors 01/07/2014  . History of breast lump/mass excision 08/08/2013  . Hypovitaminosis D 06/10/2013  . Generalized anxiety disorder 06/08/2013  . Screening for breast cancer 06/08/2013  . CAD in native artery 06/08/2013  . H/O left bundle branch block 06/08/2013  . Overweight (BMI 25.0-29.9) 01/17/2012    . Insomnia   . Esophageal stricture     Past Surgical History:  Procedure Laterality Date  . ABDOMINAL HYSTERECTOMY  2000   no history of ca   . CHOLECYSTECTOMY  2008    Prior to Admission medications   Medication Sig Start Date End Date Taking? Authorizing Provider  acetaminophen (TYLENOL) 500 MG tablet Take 1,000 mg by mouth every 6 (six) hours as needed for mild pain.    Historical Provider, MD  acyclovir (ZOVIRAX) 400 MG tablet TAKE 1 TABLET (400 MG TOTAL) BY MOUTH DAILY AS NEEDED (FEVER BLISTERS). 11/26/15   Crecencio Mc, MD  acyclovir ointment (ZOVIRAX) 5 % Apply 1 application topically every 3 (three) hours. 12/09/14   Crecencio Mc, MD  ALPRAZolam (XANAX XR) 0.5 MG 24 hr tablet Take 1 tablet (0.5 mg total) by mouth daily. 07/09/15   Crecencio Mc, MD  ALPRAZolam Duanne Moron) 0.5 MG tablet TAKE 1 TABLET BY MOUTH DAILY 12/03/15   Crecencio Mc, MD  carbamazepine (TEGRETOL) 200 MG tablet Take 200 mg by mouth 2 (two) times daily. Reported on 09/17/2015    Historical Provider, MD  carbamazepine (TEGRETOL) 200 MG tablet TAKE 1 TABLET (200 MG TOTAL) BY MOUTH 3 (THREE) TIMES DAILY. 10/20/15   Crecencio Mc, MD  cyanocobalamin 1000 MCG tablet Take 100 mcg by mouth daily.    Historical Provider, MD  escitalopram (LEXAPRO) 10 MG tablet TAKE 1 TABLET (10 MG TOTAL) BY MOUTH DAILY. 09/02/15   Crecencio Mc, MD  HYDROcodone-acetaminophen (  NORCO/VICODIN) 5-325 MG per tablet Take 1 tablet by mouth every 6 (six) hours as needed for moderate pain. 04/04/15   Coral Spikes, DO  metoprolol succinate (TOPROL-XL) 25 MG 24 hr tablet TAKE 1 TABLET (25 MG TOTAL) BY MOUTH DAILY. 02/10/15   Crecencio Mc, MD  metoprolol succinate (TOPROL-XL) 25 MG 24 hr tablet TAKE 1 TABLET (25 MG TOTAL) BY MOUTH DAILY. 08/20/15   Crecencio Mc, MD  ondansetron (ZOFRAN) 4 MG tablet Take 1 tablet (4 mg total) by mouth every 8 (eight) hours as needed for nausea or vomiting. 02/26/14   Raquel Dagoberto Ligas, NP  ondansetron (ZOFRAN) 4 MG tablet  Take 1 tablet (4 mg total) by mouth every 8 (eight) hours as needed for nausea or vomiting. 04/04/15   Coral Spikes, DO    Allergies Promethazine; Benadryl [diphenhydramine hcl]; and Promethazine hcl  Family History  Problem Relation Age of Onset  . Cancer Father     Bladder,  in Hospice  . Diabetes type II Father   . COPD Father   . Coronary artery disease Father   . Heart disease Father 46  . Diabetes Mother   . Hyperlipidemia Mother   . Hypertension Mother   . Early death Paternal Grandmother   . Heart disease Paternal Grandmother     CAD    Social History Social History  Substance Use Topics  . Smoking status: Never Smoker  . Smokeless tobacco: Never Used  . Alcohol use No    Review of Systems Constitutional: No fever/chills Eyes: No visual changes. ENT: No sore throat. Cardiovascular: Denies chest pain. Respiratory: Denies shortness of breath. Gastrointestinal: +abdominal pain.  nausea, no vomiting.  No diarrhea.  No constipation. Genitourinary: Negative for dysuria. Musculoskeletal: Negative for back pain. Skin: Negative for rash. Neurological: Negative for headaches, focal weakness or numbness.  10-point ROS otherwise negative.  ____________________________________________   PHYSICAL EXAM:  VITAL SIGNS: ED Triage Vitals  Enc Vitals Group     BP 02/25/16 2114 (!) 156/94     Pulse Rate 02/25/16 2114 77     Resp 02/25/16 2114 17     Temp 02/25/16 2114 98.2 F (36.8 C)     Temp Source 02/25/16 2114 Oral     SpO2 02/25/16 2114 98 %     Weight 02/25/16 2114 190 lb (86.2 kg)     Height 02/25/16 2114 5\' 6"  (1.676 m)     Head Circumference --      Peak Flow --      Pain Score 02/25/16 2115 8     Pain Loc --      Pain Edu? --      Excl. in [place]? --     Constitutional: Alert and oriented. Well appearing and in no acute distress. Eyes: Conjunctivae are normal. PERRL. EOMI. Head: Atraumatic. Nose: No congestion/rhinnorhea. Mouth/Throat: Mucous membranes  are moist.  Oropharynx non-erythematous. Neck: No stridor.  No meningeal signs.   Cardiovascular: Normal rate, regular rhythm. Good peripheral circulation. Grossly normal heart sounds.   Respiratory: Normal respiratory effort.  No retractions. Lungs CTAB. Gastrointestinal: Soft with diffuse mild tenderness throughout.  No distention. Musculoskeletal: No lower extremity tenderness nor edema. No gross deformities of extremities. Neurologic:  Normal speech and language. No gross focal neurologic deficits are appreciated.  Skin:  Skin is warm, dry and intact. No rash noted. Psychiatric: Mood and affect are normal. Speech and behavior are normal.  ____________________________________________   LABS (all labs ordered are listed, but  only abnormal results are displayed)  Labs Reviewed  COMPREHENSIVE METABOLIC PANEL - Abnormal; Notable for the following:       Result Value   Glucose, Bld 104 (*)    Total Bilirubin 0.2 (*)    All other components within normal limits  URINALYSIS COMPLETEWITH MICROSCOPIC ([facility] ONLY) - Abnormal; Notable for the following:    Color, Urine STRAW (*)    APPearance CLEAR (*)    Specific Gravity, Urine 1.004 (*)    Squamous Epithelial / LPF 0-5 (*)    All other components within normal limits  LIPASE, BLOOD  CBC   ____________________________________________  EKG  None ____________________________________________  RADIOLOGY   Ct Abdomen Pelvis W Contrast  Result Date: 02/26/2016 CLINICAL DATA:  Initial evaluation for acute right-sided abdominal pain, worse after eating. EXAM: CT ABDOMEN AND PELVIS WITH CONTRAST TECHNIQUE: Multidetector CT imaging of the abdomen and pelvis was performed using the standard protocol following bolus administration of intravenous contrast. CONTRAST:  149mL ISOVUE-300 IOPAMIDOL (ISOVUE-300) INJECTION 61% COMPARISON:  Prior ultrasound from 04/29/2014. FINDINGS: Mild subsegmental atelectasis seen dependently within the visualized  lung bases. The visualized lungs are otherwise clear. The liver demonstrates a normal contrast enhanced appearance. Gallbladder surgically absent. No biliary dilatation. Spleen, adrenal glands, and pancreas demonstrate no acute abnormality. Kidneys equal size with symmetric enhancement. No nephrolithiasis, hydronephrosis, or focal enhancing renal mass. Moderate hiatal hernia noted. Stomach otherwise unremarkable. No evidence for bowel obstruction. Appendix well visualized in the right lower quadrant and is of normal caliber and appearance associated inflammatory changes to suggest acute appendicitis. No abnormal wall thickening, mucosal enhancement, or inflammatory fat stranding seen about the bowels. Bladder within normal limits. Uterus is absent. Ovaries within normal limits. No free air or fluid. No pathologically enlarged intra-abdominal or pelvic lymph nodes. Normal intravascular enhancement seen throughout the intra-abdominal aorta and its branch vessels. No acute osseous abnormality. No worrisome lytic or blastic osseous lesions. Degenerative disc desiccation noted at L5-S1. IMPRESSION: 1. No CT evidence for acute intra-abdominal or pelvic process. 2. Normal appendix. 3. Status post cholecystectomy. 4. Moderate hiatal hernia. Electronically Signed   By: Jeannine Boga M.D.   On: 02/26/2016 01:59    ____________________________________________   PROCEDURES  Procedure(s) performed:   Procedures   Critical Care performed: No ____________________________________________   INITIAL IMPRESSION / ASSESSMENT AND PLAN / ED COURSE  Pertinent labs & imaging results that were available during my care of the patient were reviewed by me and considered in my medical decision making (see chart for details).  Overall reassuring workup and examination.  Vital signs are normal.  The patient has a moderate hiatal hernia which may explain her symptoms.  She has seen Dr. Allen Norris in the past and I encouraged  her to continue taking her omeprazole and follow up with either her primary care doctor or her GI doctor.  I gave my usual and customary return precautions.     ____________________________________________  FINAL CLINICAL IMPRESSION(S) / ED DIAGNOSES  Final diagnoses:  Generalized abdominal pain  Hiatal hernia     MEDICATIONS GIVEN DURING THIS VISIT:  Medications  ondansetron (ZOFRAN) injection 4 mg (4 mg Intravenous Given 02/26/16 0020)  diatrizoate meglumine-sodium (GASTROGRAFIN) 66-10 % solution 15 mL (15 mLs Oral Given 02/26/16 0025)  iopamidol (ISOVUE-300) 61 % injection 100 mL (100 mLs Intravenous Contrast Given 02/26/16 0122)     NEW OUTPATIENT MEDICATIONS STARTED DURING THIS VISIT:  New Prescriptions   No medications on file      Note:  This document was prepared using Dragon voice recognition software and may include unintentional dictation errors.    Hinda Kehr, MD 02/26/16 [phone]

## 2016-02-26 NOTE — ED Notes (Signed)
Patient transported to CT 

## 2016-02-26 NOTE — Discharge Instructions (Signed)
You have been seen in the Emergency Department (ED) for abdominal pain.  Your evaluation did not identify a clear cause of your symptoms but was generally reassuring.  We believe your symptoms may be the result of a hiatal hernial; please read through the included information for more details.  Please follow up as instructed above regarding today?s emergent visit and the symptoms that are bothering you.  Return to the ED if your abdominal pain worsens or fails to improve, you develop bloody vomiting, bloody diarrhea, you are unable to tolerate fluids due to vomiting, fever greater than 101, or other symptoms that concern you.

## 2016-03-08 ENCOUNTER — Other Ambulatory Visit: Payer: Self-pay | Admitting: Internal Medicine

## 2016-03-08 NOTE — Telephone Encounter (Signed)
refilled 

## 2016-03-08 NOTE — Telephone Encounter (Signed)
Last OV 09/17/15 ok to fill alprazolam and lexapro?

## 2016-04-19 ENCOUNTER — Other Ambulatory Visit: Payer: Self-pay | Admitting: Internal Medicine

## 2016-05-05 ENCOUNTER — Other Ambulatory Visit: Payer: Self-pay

## 2016-05-05 ENCOUNTER — Telehealth: Payer: Self-pay

## 2016-05-06 ENCOUNTER — Ambulatory Visit: Payer: 59 | Admitting: General Surgery

## 2016-05-07 ENCOUNTER — Encounter: Payer: Self-pay | Admitting: Emergency Medicine

## 2016-05-07 ENCOUNTER — Emergency Department
Admission: EM | Admit: 2016-05-07 | Discharge: 2016-05-07 | Disposition: A | Discharge status: Home / Self Care | Payer: 59 | Attending: Emergency Medicine | Admitting: Emergency Medicine

## 2016-05-07 ENCOUNTER — Other Ambulatory Visit: Payer: Self-pay

## 2016-05-07 DIAGNOSIS — I10 Essential (primary) hypertension: Secondary | ICD-10-CM | POA: Diagnosis not present

## 2016-05-07 DIAGNOSIS — Z79899 Other long term (current) drug therapy: Secondary | ICD-10-CM | POA: Diagnosis not present

## 2016-05-07 DIAGNOSIS — I2510 Atherosclerotic heart disease of native coronary artery without angina pectoris: Secondary | ICD-10-CM | POA: Insufficient documentation

## 2016-05-07 DIAGNOSIS — R112 Nausea with vomiting, unspecified: Secondary | ICD-10-CM | POA: Diagnosis not present

## 2016-05-07 DIAGNOSIS — R1013 Epigastric pain: Secondary | ICD-10-CM | POA: Insufficient documentation

## 2016-05-07 LAB — CBC
HEMATOCRIT: 43.1 % (ref 35.0–47.0)
HEMOGLOBIN: 14.7 g/dL (ref 12.0–16.0)
MCH: 30.1 pg (ref 26.0–34.0)
MCHC: 34.1 g/dL (ref 32.0–36.0)
MCV: 88.3 fL (ref 80.0–100.0)
Platelets: 289 10*3/uL (ref 150–440)
RBC: 4.88 MIL/uL (ref 3.80–5.20)
RDW: 14.4 % (ref 11.5–14.5)
WBC: 7.6 10*3/uL (ref 3.6–11.0)

## 2016-05-07 LAB — COMPREHENSIVE METABOLIC PANEL
ALT: 21 U/L (ref 14–54)
AST: 24 U/L (ref 15–41)
Albumin: 4.1 g/dL (ref 3.5–5.0)
Alkaline Phosphatase: 107 U/L (ref 38–126)
Anion gap: 11 (ref 5–15)
BUN: 13 mg/dL (ref 6–20)
CHLORIDE: 107 mmol/L (ref 101–111)
CO2: 23 mmol/L (ref 22–32)
CREATININE: 0.83 mg/dL (ref 0.44–1.00)
Calcium: 9.3 mg/dL (ref 8.9–10.3)
GFR calc non Af Amer: >60 mL/min (ref >60)
Glucose, Bld: 98 mg/dL (ref 65–99)
POTASSIUM: 3.9 mmol/L (ref 3.5–5.1)
SODIUM: 141 mmol/L (ref 135–145)
Total Bilirubin: 0.3 mg/dL (ref 0.3–1.2)
Total Protein: 7.5 g/dL (ref 6.5–8.1)

## 2016-05-07 LAB — URINALYSIS COMPLETE WITH MICROSCOPIC (ARMC ONLY)
BACTERIA UA: NONE SEEN
Bilirubin Urine: NEGATIVE
Glucose, UA: NEGATIVE mg/dL
HGB URINE DIPSTICK: NEGATIVE
Ketones, ur: NEGATIVE mg/dL
Nitrite: NEGATIVE
PH: 5 (ref 5.0–8.0)
PROTEIN: NEGATIVE mg/dL
Specific Gravity, Urine: 1.019 (ref 1.005–1.030)

## 2016-05-07 LAB — LIPASE, BLOOD: LIPASE: 42 U/L (ref 11–51)

## 2016-05-07 MED ORDER — ONDANSETRON HCL 4 MG/2ML IJ SOLN
4.0000 mg | Freq: Once | INTRAMUSCULAR | Status: AC
  Administered 2016-05-07: 4 mg via INTRAVENOUS
  Filled 2016-05-07: qty 2

## 2016-05-07 MED ORDER — SODIUM CHLORIDE 0.9 % IV BOLUS (SEPSIS)
1000.0000 mL | Freq: Once | INTRAVENOUS | Status: AC
  Administered 2016-05-07: 1000 mL via INTRAVENOUS

## 2016-05-07 MED ORDER — ONDANSETRON 4 MG PO TBDP: 4.0000 mg | ORAL_TABLET | Freq: Three times a day (TID) | ORAL | 0 refills | Status: DC | PRN

## 2016-05-07 MED ORDER — CALCIUM CARBONATE ANTACID 500 MG PO CHEW: 1.0000 | CHEWABLE_TABLET | Freq: Three times a day (TID) | ORAL | 0 refills | Status: AC

## 2016-05-07 MED ORDER — PANTOPRAZOLE SODIUM 20 MG PO TBEC: 20.0000 mg | DELAYED_RELEASE_TABLET | Freq: Every day | ORAL | 1 refills | Status: DC

## 2016-05-07 MED ORDER — FAMOTIDINE IN NACL 20-0.9 MG/50ML-% IV SOLN
20.0000 mg | Freq: Once | INTRAVENOUS | Status: AC
  Administered 2016-05-07: 20 mg via INTRAVENOUS
  Filled 2016-05-07: qty 50

## 2016-05-07 MED ORDER — GI COCKTAIL ~~LOC~~: 30.0000 mL | Freq: Once | ORAL | Status: DC

## 2016-05-07 NOTE — Discharge Instructions (Signed)

## 2016-05-07 NOTE — Telephone Encounter (Signed)
Pt has been scheduled for an ov with Dr. Vicente Males on Tuesday and procedure in [facility] on Wednesday.

## 2016-05-07 NOTE — ED Triage Notes (Signed)
Pt reports dx with hiatal hernia 1 month ago. Reports increased abdominal swelling the past few days and constant nausea. Pt has appts next week for hernia consult but they sent her here today for evaluation.

## 2016-05-07 NOTE — ED Provider Notes (Signed)
[facility] Emergency Department Provider Note  ____________________________________________  Time seen: Approximately 12:51 PM  I have reviewed the triage vital signs and the nursing notes.   HISTORY  Chief Complaint Abdominal Pain   HPI Mary Bryant is a 50 y.o. female with a h/o esophageal stricture s/p dilation in 2009, hiatal hernia, GERD presents for evaluation of abdominal pain. Patient reports epigastric abdominal pain that she describes as pressure, constant, radiating to her back. The pain 7/10. She's been having this pain for the last 2 days. The pain is associated with severe nausea and a few episodes of nonbloody nonbilious emesis. Patient is scheduled to see Dr. Durwin Reges, GI this Tuesday for endoscopy. Patient reports that she called his office yesterday to let him know that her symptoms were worse and she was told to come to the ED for evaluation. Patient does not take any medications for her GERD. Her only prior abdominal surgery was cholecystectomy many years ago. She is passing flatus and no abdominal distention. No fever or chills, no chest pain or shortness of breath, no dysuria or hematuria.  Past Medical History:  Diagnosis Date  . Esophageal stricture    dilated 2009,  [place]  . Hypertension    no prior treatment  . Insomnia    chronic, no prior sleep study  . rhinitis allergic     Patient Active Problem List   Diagnosis Date Noted  . Encounter for preventive health examination 05/10/2015  . Menopausal hot flushes 05/10/2015  . S/P total hysterectomy 05/08/2015  . RLQ abdominal pain 04/04/2015  . 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 Long-term use of high-risk medication 04/21/2014  . Trigeminal neuralgia of left side of face 03/22/2014  . 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 Occasional tremors 01/07/2014  . History of breast lump/mass excision 08/08/2013  . Hypovitaminosis D 06/10/2013  .  Generalized anxiety disorder 06/08/2013  . Screening for breast cancer 06/08/2013  . CAD in native artery 06/08/2013  . H/O left bundle branch block 06/08/2013  . Overweight (BMI 25.0-29.9) 01/17/2012  . Insomnia   . Esophageal stricture     Past Surgical History:  Procedure Laterality Date  . ABDOMINAL HYSTERECTOMY  2000   no history of ca   . CHOLECYSTECTOMY  2008    Prior to Admission medications   Medication Sig Start Date End Date Taking? Authorizing Provider  acetaminophen (TYLENOL) 500 MG tablet Take 1,000 mg by mouth every 6 (six) hours as needed for mild pain.    Historical Provider, MD  acyclovir (ZOVIRAX) 400 MG tablet TAKE 1 TABLET (400 MG TOTAL) BY MOUTH DAILY AS NEEDED (FEVER BLISTERS). 11/26/15   Crecencio Mc, MD  acyclovir ointment (ZOVIRAX) 5 % Apply 1 application topically every 3 (three) hours. 12/09/14   Crecencio Mc, MD  ALPRAZolam (XANAX XR) 0.5 MG 24 hr tablet Take 1 tablet (0.5 mg total) by mouth daily. 07/09/15   Crecencio Mc, MD  ALPRAZolam Duanne Moron) 0.5 MG tablet TAKE 1 TABLET BY MOUTH ONCE DAILY 03/08/16   Crecencio Mc, MD  calcium carbonate (TUMS) 500 MG chewable tablet Chew 1 tablet (200 mg of elemental calcium total) by mouth 3 (three) times daily with meals. 05/07/16 06/06/16  Rudene Re, MD  carbamazepine (TEGRETOL) 200 MG tablet Take 200 mg by mouth 2 (two) times daily. Reported on 09/17/2015    Historical Provider, MD  carbamazepine (TEGRETOL) 200 MG tablet TAKE 1 TABLET (200 MG TOTAL)  BY MOUTH 3 (THREE) TIMES DAILY. 10/20/15   Crecencio Mc, MD  cyanocobalamin 1000 MCG tablet Take 100 mcg by mouth daily.    Historical Provider, MD  escitalopram (LEXAPRO) 10 MG tablet TAKE 1 TABLET BY MOUTH ONCE DAILY 03/08/16   Crecencio Mc, MD  HYDROcodone-acetaminophen (NORCO/VICODIN) 5-325 MG per tablet Take 1 tablet by mouth every 6 (six) hours as needed for moderate pain. 04/04/15   Coral Spikes, DO  metoprolol succinate (TOPROL-XL) 25 MG 24 hr tablet TAKE  1 TABLET (25 MG TOTAL) BY MOUTH DAILY. 02/10/15   Crecencio Mc, MD  metoprolol succinate (TOPROL-XL) 25 MG 24 hr tablet TAKE 1 TABLET (25 MG TOTAL) BY MOUTH DAILY. 08/20/15   Crecencio Mc, MD  metoprolol succinate (TOPROL-XL) 25 MG 24 hr tablet TAKE 1 TABLET BY MOUTH ONCE DAILY 04/19/16   Crecencio Mc, MD  ondansetron (ZOFRAN ODT) 4 MG disintegrating tablet Take 1 tablet (4 mg total) by mouth every 8 (eight) hours as needed for nausea or vomiting. 05/07/16   Rudene Re, MD  ondansetron (ZOFRAN) 4 MG tablet Take 1 tablet (4 mg total) by mouth every 8 (eight) hours as needed for nausea or vomiting. 02/26/14   Raquel Dagoberto Ligas, NP  ondansetron (ZOFRAN) 4 MG tablet Take 1 tablet (4 mg total) by mouth every 8 (eight) hours as needed for nausea or vomiting. 04/04/15   Coral Spikes, DO  pantoprazole (PROTONIX) 20 MG tablet Take 1 tablet (20 mg total) by mouth daily. 05/07/16 05/07/17  Rudene Re, MD    Allergies Promethazine; Benadryl [diphenhydramine hcl]; and Promethazine hcl  Family History  Problem Relation Age of Onset  . Cancer Father     Bladder,  in Hospice  . Diabetes type II Father   . COPD Father   . Coronary artery disease Father   . Heart disease Father 51  . Diabetes Mother   . Hyperlipidemia Mother   . Hypertension Mother   . Early death Paternal Grandmother   . Heart disease Paternal Grandmother     CAD    Social History Social History  Substance Use Topics  . Smoking status: Never Smoker  . Smokeless tobacco: Never Used  . Alcohol use No    Review of Systems  Constitutional: Negative for fever. Eyes: Negative for visual changes. ENT: Negative for sore throat. Cardiovascular: Negative for chest pain. Respiratory: Negative for shortness of breath. Gastrointestinal: + epigastric abdominal pain, nausea, and vomiting. No diarrhea. Genitourinary: Negative for dysuria. Musculoskeletal: Negative for back pain. Skin: Negative for rash. Neurological: Negative  for headaches, weakness or numbness.  ____________________________________________   PHYSICAL EXAM:  VITAL SIGNS: ED Triage Vitals [05/07/16 1121]  Enc Vitals Group     BP (!) 147/98     Pulse Rate 75     Resp 16     Temp 98.2 F (36.8 C)     Temp Source Oral     SpO2 99 %     Weight 190 lb (86.2 kg)     Height 5\' 7"  (1.702 m)     Head Circumference      Peak Flow      Pain Score 7     Pain Loc      Pain Edu?      Excl. in [place]?     Constitutional: Alert and oriented. Well appearing and in no apparent distress. HEENT:      Head: Normocephalic and atraumatic.  Eyes: Conjunctivae are normal. Sclera is non-icteric. EOMI. PERRL      Mouth/Throat: Mucous membranes are moist.       Neck: Supple with no signs of meningismus. Cardiovascular: Regular rate and rhythm. No murmurs, gallops, or rubs. 2+ symmetrical distal pulses are present in all extremities. No JVD. Respiratory: Normal respiratory effort. Lungs are clear to auscultation bilaterally. No wheezes, crackles, or rhonchi.  Gastrointestinal: Soft, mild epigastric ttp, non distended with positive bowel sounds. No rebound or guarding. Musculoskeletal: Nontender with normal range of motion in all extremities. No edema, cyanosis, or erythema of extremities. Neurologic: Normal speech and language. Face is symmetric. Moving all extremities. No gross focal neurologic deficits are appreciated. Skin: Skin is warm, dry and intact. No rash noted. Psychiatric: Mood and affect are normal. Speech and behavior are normal.  ____________________________________________   LABS (all labs ordered are listed, but only abnormal results are displayed)  Labs Reviewed  URINALYSIS COMPLETEWITH MICROSCOPIC ([place] ONLY) - Abnormal; Notable for the following:       Result Value   Color, Urine YELLOW (*)    APPearance CLEAR (*)    Leukocytes, UA TRACE (*)    Squamous Epithelial / LPF 0-5 (*)    All other components within normal limits    LIPASE, BLOOD  COMPREHENSIVE METABOLIC PANEL  CBC   ____________________________________________  EKG  ED ECG REPORT I, Rudene Re, the attending physician, personally viewed and interpreted this ECG.  Normal sinus rhythm, rate of 50, normal PR and QTc intervals, left bundle branch block, left axis deviation, no ST elevations or depressions. EKG is unchanged from prior from 2016 ____________________________________________  RADIOLOGY  none  ____________________________________________   PROCEDURES  Procedure(s) performed: None Procedures Critical Care performed:  None ____________________________________________   INITIAL IMPRESSION / ASSESSMENT AND PLAN / ED COURSE  50 y.o. female with a h/o esophageal stricture s/p dilation in 2009, hiatal hernia, GERD presents for evaluation of constant epigastric abdominal pain x 2 days Associated with nausea and vomiting. Patient scheduled for an endoscopy next week. Patient was told to come in here after calling Dr. Dorothey Baseman office yesterday. Labs WNL with normal CBC, CMP, lipase, and UA. Will get EKG but low suspicion for ACS based on description of pain and exam and risk factors. Will give her IVF, IV zofran, IV pepcid for symptom relief. Will consult GI  Spoke with Dr. Vira Agar who reports that he is unable to admit patient for an endoscopy as his schedule was full and this is not emergent. He recommended starting patient on PPI, and antacids, and bland diet and recommended the patient keep her appointment next week with Dr. Allen Norris. Will also provide patient with prescription for zofran,  Pertinent labs & imaging results that were available during my care of the patient were reviewed by me and considered in my medical decision making (see chart for details).    ____________________________________________   FINAL CLINICAL IMPRESSION(S) / ED DIAGNOSES  Final diagnoses:  Epigastric abdominal pain  Non-intractable vomiting with  nausea, unspecified vomiting type      NEW MEDICATIONS STARTED DURING THIS VISIT:  New Prescriptions   CALCIUM CARBONATE (TUMS) 500 MG CHEWABLE TABLET    Chew 1 tablet (200 mg of elemental calcium total) by mouth 3 (three) times daily with meals.   ONDANSETRON (ZOFRAN ODT) 4 MG DISINTEGRATING TABLET    Take 1 tablet (4 mg total) by mouth every 8 (eight) hours as needed for nausea or vomiting.   PANTOPRAZOLE (PROTONIX) 20 MG TABLET  Take 1 tablet (20 mg total) by mouth daily.     Note:  This document was prepared using Dragon voice recognition software and may include unintentional dictation errors.    Rudene Re, MD 05/07/16 1409

## 2016-05-10 ENCOUNTER — Encounter: Payer: Self-pay | Admitting: *Deleted

## 2016-05-10 ENCOUNTER — Ambulatory Visit (INDEPENDENT_AMBULATORY_CARE_PROVIDER_SITE_OTHER): Payer: 59 | Admitting: Internal Medicine

## 2016-05-10 VITALS — BP 136/98 | HR 83 | Temp 98.6°F | Resp 12 | Ht 66.0 in | Wt 198.0 lb

## 2016-05-10 DIAGNOSIS — Z1231 Encounter for screening mammogram for malignant neoplasm of breast: Secondary | ICD-10-CM | POA: Diagnosis not present

## 2016-05-10 DIAGNOSIS — Z1239 Encounter for other screening for malignant neoplasm of breast: Secondary | ICD-10-CM

## 2016-05-10 DIAGNOSIS — Z13220 Encounter for screening for lipoid disorders: Secondary | ICD-10-CM

## 2016-05-10 DIAGNOSIS — K2960 Other gastritis without bleeding: Secondary | ICD-10-CM

## 2016-05-10 DIAGNOSIS — K210 Gastro-esophageal reflux disease with esophagitis: Secondary | ICD-10-CM

## 2016-05-10 DIAGNOSIS — E559 Vitamin D deficiency, unspecified: Secondary | ICD-10-CM | POA: Diagnosis not present

## 2016-05-10 DIAGNOSIS — B9681 Helicobacter pylori [H. pylori] as the cause of diseases classified elsewhere: Secondary | ICD-10-CM

## 2016-05-10 DIAGNOSIS — R5383 Other fatigue: Secondary | ICD-10-CM

## 2016-05-10 DIAGNOSIS — Z0000 Encounter for general adult medical examination without abnormal findings: Secondary | ICD-10-CM

## 2016-05-10 DIAGNOSIS — K2970 Gastritis, unspecified, without bleeding: Secondary | ICD-10-CM

## 2016-05-10 DIAGNOSIS — K449 Diaphragmatic hernia without obstruction or gangrene: Secondary | ICD-10-CM

## 2016-05-10 LAB — CBC WITH DIFFERENTIAL/PLATELET
Basophils Absolute: 0 10*3/uL (ref 0.0–0.1)
Basophils Relative: 0.6 % (ref 0.0–3.0)
EOS PCT: 2.7 % (ref 0.0–5.0)
Eosinophils Absolute: 0.2 10*3/uL (ref 0.0–0.7)
HCT: 40.5 % (ref 36.0–46.0)
Hemoglobin: 13.4 g/dL (ref 12.0–15.0)
LYMPHS ABS: 1.6 10*3/uL (ref 0.7–4.0)
Lymphocytes Relative: 21.6 % (ref 12.0–46.0)
MCHC: 33.2 g/dL (ref 30.0–36.0)
MCV: 88.1 fl (ref 78.0–100.0)
MONOS PCT: 8.6 % (ref 3.0–12.0)
Monocytes Absolute: 0.7 10*3/uL (ref 0.1–1.0)
NEUTROS ABS: 5 10*3/uL (ref 1.4–7.7)
NEUTROS PCT: 66.5 % (ref 43.0–77.0)
PLATELETS: 314 10*3/uL (ref 150.0–400.0)
RBC: 4.59 Mil/uL (ref 3.87–5.11)
RDW: 14.4 % (ref 11.5–15.5)
WBC: 7.6 10*3/uL (ref 4.0–10.5)

## 2016-05-10 LAB — COMPREHENSIVE METABOLIC PANEL
ALBUMIN: 4.2 g/dL (ref 3.5–5.2)
ALT: 16 U/L (ref 0–35)
AST: 19 U/L (ref 0–37)
Alkaline Phosphatase: 105 U/L (ref 39–117)
BILIRUBIN TOTAL: 0.3 mg/dL (ref 0.2–1.2)
BUN: 9 mg/dL (ref 6–23)
CHLORIDE: 106 meq/L (ref 96–112)
CO2: 31 meq/L (ref 19–32)
CREATININE: 0.86 mg/dL (ref 0.40–1.20)
Calcium: 9.6 mg/dL (ref 8.4–10.5)
GFR: 74.6 mL/min (ref >60.00)
Glucose, Bld: 96 mg/dL (ref 70–99)
Potassium: 3.8 mEq/L (ref 3.5–5.1)
SODIUM: 141 meq/L (ref 135–145)
Total Protein: 7.2 g/dL (ref 6.0–8.3)

## 2016-05-10 LAB — LIPID PANEL
Cholesterol: 215 mg/dL — ABNORMAL HIGH (ref 0–200)
HDL: 62.9 mg/dL (ref >39.00)
LDL Cholesterol: 126 mg/dL — ABNORMAL HIGH (ref 0–99)
NONHDL: 152.15
Total CHOL/HDL Ratio: 3
Triglycerides: 129 mg/dL (ref 0.0–149.0)
VLDL: 25.8 mg/dL (ref 0.0–40.0)

## 2016-05-10 LAB — H. PYLORI ANTIBODY, IGG: H PYLORI IGG: POSITIVE — AB

## 2016-05-10 LAB — TSH: TSH: 2.85 u[IU]/mL (ref 0.35–4.50)

## 2016-05-10 LAB — VITAMIN D 25 HYDROXY (VIT D DEFICIENCY, FRACTURES): VITD: 18.9 ng/mL — ABNORMAL LOW (ref 30.00–100.00)

## 2016-05-10 NOTE — Progress Notes (Addendum)
Patient ID: Mary Bryant, female    DOB: 29-Apr-1966  Age: 50 y.o. MRN: [id number]  The patient is here for annual  wellness examination and management of other chronic and acute problems.  S/p TAH No mammograms since 2014 [facility] [place] right breast, 6 month follow up was advised .  Mammogram ordered Oct 2016 but not done    The risk factors are reflected in the social history.  The roster of all physicians providing medical care to patient - is listed in the Snapshot section of the chart. Home safety : The patient has smoke detectors in the home. They wear seatbelts.  There are no firearms at home. There is no violence in the home.   There is no risks for hepatitis, STDs or HIV. There is no   history of blood transfusion. They have no travel history to infectious disease endemic areas of the world.  The patient has seen their dentist in the last six month. They have seen their eye doctor in the last year. T    They do not  have excessive sun exposure. Discussed the need for sun protection: hats, long sleeves and use of sunscreen if there is significant sun exposure.   Diet: the importance of a healthy diet is discussed. They do have a healthy diet.  The benefits of regular aerobic exercise were discussed. She does not exercise   Depression screen: there are no signs or vegative symptoms of depression- irritability, change in appetite, anhedonia, sadness/tearfullness.   The following portions of the patient's history were reviewed and updated as appropriate: allergies, current medications, past family history, past medical history,  past surgical history, past social history  and problem list.  Visual acuity was not assessed per patient preference since she has regular follow up with her ophthalmologist. Hearing and body mass index were assessed and reviewed.   During the course of the visit the patient was educated and counseled about appropriate screening and preventive services including :  fall prevention , diabetes screening, nutrition counseling, colorectal cancer screening, and recommended immunizations.    CC: The primary encounter diagnosis was Breast cancer screening. Diagnoses of Other gastritis without hemorrhage, unspecified chronicity, Vitamin D deficiency, Screening for hyperlipidemia, Fatigue, unspecified type, Hiatal hernia, Encounter for preventive health examination, Hiatal hernia with GERD and esophagitis, and Helicobacter pylori gastritis were also pertinent to this visit.  ER visit for abd pain,  Hiatal hernia was foun don CT all else normal.  Has endoscopy scheduled with Dr Dorothey Baseman colleague this Wed am ,  Then  Has OV with surgeon Adonis Huguenin to discuss repair   Working 3 12 hour shifts   Energy level better,  Sleeping better.     History Mary Bryant has a past medical history of Esophageal stricture; GERD (gastroesophageal reflux disease); History of hiatal hernia; Hypertension; Insomnia; Left bundle branch block; PONV (postoperative nausea and vomiting); rhinitis allergic; Wears contact lenses; and Wears hearing aid.   She has a past surgical history that includes Cholecystectomy (2008) and Abdominal hysterectomy (2000).   Her family history includes COPD in her father; Cancer in her father; Coronary artery disease in her father; Diabetes in her mother; Diabetes type II in her father; Early death in her paternal grandmother; Heart disease in her paternal grandmother; Heart disease (age of onset: 39) in her father; Hyperlipidemia in her mother; Hypertension in her mother.She reports that she has never smoked. She has never used smokeless tobacco. She reports that she does not drink alcohol or use  drugs.  Outpatient Medications Prior to Visit  Medication Sig Dispense Refill  . acetaminophen (TYLENOL) 500 MG tablet Take 1,000 mg by mouth every 6 (six) hours as needed for mild pain.    Marland Kitchen acyclovir (ZOVIRAX) 400 MG tablet TAKE 1 TABLET (400 MG TOTAL) BY MOUTH DAILY AS  NEEDED (FEVER BLISTERS). 30 tablet 0  . acyclovir ointment (ZOVIRAX) 5 % Apply 1 application topically every 3 (three) hours. 5 g 1  . ALPRAZolam (XANAX) 0.5 MG tablet TAKE 1 TABLET BY MOUTH ONCE DAILY 30 tablet 2  . calcium carbonate (TUMS) 500 MG chewable tablet Chew 1 tablet (200 mg of elemental calcium total) by mouth 3 (three) times daily with meals. (Patient not taking: Reported on 05/12/2016) 90 tablet 0  . carbamazepine (TEGRETOL) 200 MG tablet TAKE 1 TABLET (200 MG TOTAL) BY MOUTH 3 (THREE) TIMES DAILY. 90 tablet 3  . cyanocobalamin 1000 MCG tablet Take 100 mcg by mouth daily.    Marland Kitchen escitalopram (LEXAPRO) 10 MG tablet TAKE 1 TABLET BY MOUTH ONCE DAILY 30 tablet 2  . HYDROcodone-acetaminophen (NORCO/VICODIN) 5-325 MG per tablet Take 1 tablet by mouth every 6 (six) hours as needed for moderate pain. 30 tablet 0  . metoprolol succinate (TOPROL-XL) 25 MG 24 hr tablet TAKE 1 TABLET BY MOUTH ONCE DAILY 30 tablet 2  . ondansetron (ZOFRAN ODT) 4 MG disintegrating tablet Take 1 tablet (4 mg total) by mouth every 8 (eight) hours as needed for nausea or vomiting. 20 tablet 0  . pantoprazole (PROTONIX) 20 MG tablet Take 1 tablet (20 mg total) by mouth daily. 30 tablet 1   No facility-administered medications prior to visit.     Review of Systems   Patient denies headache, fevers, malaise, unintentional weight loss, skin rash, eye pain, sinus congestion and sinus pain, sore throat, dysphagia,  hemoptysis , cough, dyspnea, wheezing, chest pain, palpitations, orthopnea, edema, , nausea, melena, diarrhea, constipation, flank pain, dysuria, hematuria, urinary  Frequency, nocturia, numbness, tingling, seizures,  Focal weakness, Loss of consciousness,  Tremor, insomnia, depression, anxiety, and suicidal ideation.      Objective:  BP (!) 136/98   Pulse 83   Temp 98.6 F (37 C) (Oral)   Resp 12   Ht 5\' 6"  (1.676 m)   Wt 198 lb (89.8 kg)   SpO2 96%   BMI 31.96 kg/m   Physical Exam   General  appearance: alert, cooperative and appears stated age Head: Normocephalic, without obvious abnormality, atraumatic Eyes: conjunctivae/corneas clear. PERRL, EOM's intact. Fundi benign. Ears: normal TM's and external ear canals both ears Nose: Nares normal. Septum midline. Mucosa normal. No drainage or sinus tenderness. Throat: lips, mucosa, and tongue normal; teeth and gums normal Neck: no adenopathy, no carotid bruit, no JVD, supple, symmetrical, trachea midline and thyroid not enlarged, symmetric, no tenderness/mass/nodules Lungs: clear to auscultation bilaterally Breasts: normal appearance, no masses or tenderness Heart: regular rate and rhythm, S1, S2 normal, no murmur, click, rub or gallop Abdomen: soft, non-tender; bowel sounds normal; no masses,  no organomegaly Extremities: extremities normal, atraumatic, no cyanosis or edema Pulses: 2+ and symmetric Skin: Skin color, texture, turgor normal. No rashes or lesions Neurologic: Alert and oriented X 3, normal strength and tone. Normal symmetric reflexes. Normal coordination and gait.    Assessment & Plan:   Problem List Items Addressed This Visit    Vitamin D deficiency   Relevant Orders   VITAMIN D 25 Hydroxy (Vit-D Deficiency, Fractures) (Completed)   Hiatal hernia   Helicobacter pylori  gastritis    By serologies.  Will treat.       Relevant Medications   clarithromycin (BIAXIN) 250 MG tablet   Encounter for preventive health examination    Annual comprehensive preventive exam was done as well as an evaluation and management of chronic conditions .  During the course of the visit the patient was educated and counseled about appropriate screening and preventive services including :  diabetes screening, lipid analysis with projected  10 year  risk for CAD , nutrition counseling, breast, cervical and colorectal cancer screening, and recommended immunizations.  Printed recommendations for health maintenance screenings was given       Hiatal hernia with GERD and esophagitis    Diagnosis explained.  Has EGD and Gen Surg follow up by recent ER evaluation        Other Visit Diagnoses    Breast cancer screening    -  Primary   Relevant Orders   MM DIGITAL SCREENING BILATERAL   Other gastritis without hemorrhage, unspecified chronicity       Relevant Orders   H. pylori antibody, IgG (Completed)   Screening for hyperlipidemia       Relevant Orders   Lipid panel (Completed)   Fatigue, unspecified type       Relevant Orders   Comprehensive metabolic panel (Completed)   TSH (Completed)   CBC with Differential/Platelet (Completed)      I am having Ms. Boivin start on ergocalciferol, clarithromycin, and amoxicillin. I am also having her maintain her acetaminophen, cyanocobalamin, acyclovir ointment, HYDROcodone-acetaminophen, carbamazepine, acyclovir, ALPRAZolam, escitalopram, metoprolol succinate, pantoprazole, ondansetron, and calcium carbonate.  Meds ordered this encounter  Medications  . ergocalciferol (VITAMIN D2) 50000 units capsule    Sig: Take 1 capsule (50,000 Units total) by mouth once a week.    Dispense:  4 capsule    Refill:  2  . clarithromycin (BIAXIN) 250 MG tablet    Sig: Take 2 tablets (500 mg total) by mouth 2 (two) times daily.    Dispense:  40 tablet    Refill:  0  . amoxicillin (AMOXIL) 500 MG capsule    Sig: Take 2 capsules (1,000 mg total) by mouth 2 (two) times daily.    Dispense:  40 capsule    Refill:  0    There are no discontinued medications.  Follow-up: No Follow-up on file.   Crecencio Mc, MD

## 2016-05-10 NOTE — Progress Notes (Signed)
Pre-visit discussion using our clinic review tool. No additional management support is needed unless otherwise documented below in the visit note.  

## 2016-05-10 NOTE — Patient Instructions (Signed)
Hiatal Hernia A hiatal hernia occurs when part of your stomach slides above the muscle that separates your abdomen from your chest (diaphragm). You can be born with a hiatal hernia (congenital), or it may develop over time. In almost all cases of hiatal hernia, only the top part of the stomach pushes through.  Many people have a hiatal hernia with no symptoms. The larger the hernia, the more likely that you will have symptoms. In some cases, a hiatal hernia allows stomach acid to flow back into the tube that carries food from your mouth to your stomach (esophagus). This may cause heartburn symptoms. Severe heartburn symptoms may mean you have developed a condition called gastroesophageal reflux disease (GERD).  CAUSES  Hiatal hernias are caused by a weakness in the opening (hiatus) where your esophagus passes through your diaphragm to attach to the upper part of your stomach. You may be born with a weakness in your hiatus, or a weakness can develop. RISK FACTORS Older age is a major risk factor for a hiatal hernia. Anything that increases pressure on your diaphragm can also increase your risk of a hiatal hernia. This includes:  Pregnancy.  Excess weight.  Frequent constipation. SIGNS AND SYMPTOMS  People with a hiatal hernia often have no symptoms. If symptoms develop, they are almost always caused by GERD. They may include:  Heartburn.  Belching.  Indigestion.  Trouble swallowing.  Coughing or wheezing.  Sore throat.  Hoarseness.  Chest pain. DIAGNOSIS  A hiatal hernia is sometimes found during an exam for another problem. Your health care provider may suspect a hiatal hernia if you have symptoms of GERD. Tests may be done to diagnose GERD. These may include:  X-rays of your stomach or chest.  An upper gastrointestinal (GI) series. This is an X-ray exam of your GI tract involving the use of a chalky liquid that you swallow. The liquid shows up clearly on the X-ray.  Endoscopy.  This is a procedure to look into your stomach using a thin, flexible tube that has a tiny camera and light on the end of it. TREATMENT  If you have no symptoms, you may not need treatment. If you have symptoms, treatment may include:  Dietary and lifestyle changes to help reduce GERD symptoms.  Medicines. These may include:  Over-the-counter antacids.  Medicines that make your stomach empty more quickly.  Medicines that block the production of stomach acid (H2 blockers).  Stronger medicines to reduce stomach acid (proton pump inhibitors).  You may need surgery to repair the hernia if other treatments are not helping. HOME CARE INSTRUCTIONS   Take all medicines as directed by your health care provider.  Quit smoking, if you smoke.  Try to achieve and maintain a healthy body weight.  Eat frequent small meals instead of three large meals a day. This keeps your stomach from getting too full.  Eat slowly.  Do not lie down right after eating.  Do noteat 1-2 hours before bed.   Do not drink beverages with caffeine. These include cola, coffee, cocoa, and tea.  Do not drink alcohol.  Avoid foods that can make symptoms of GERD worse. These may include:  Fatty foods.  Citrus fruits.  Other foods and drinks that contain acid.  Avoid putting pressure on your belly. Anything that puts pressure on your belly increases the amount of acid that may be pushed up into your esophagus.   Avoid bending over, especially after eating.  Raise the head of your bed  by putting blocks under the legs. This keeps your head and esophagus higher than your stomach.  Do not wear tight clothing around your chest or stomach.  Try not to strain when having a bowel movement, when urinating, or when lifting heavy objects. SEEK MEDICAL CARE IF:  Your symptoms are not controlled with medicines or lifestyle changes.  You are having trouble swallowing.  You have coughing or wheezing that will not  go away. SEEK IMMEDIATE MEDICAL CARE IF:  Your pain is getting worse.  Your pain spreads to your arms, neck, jaw, teeth, or back.  You have shortness of breath.  You sweat for no reason.  You feel sick to your stomach (nauseous) or vomit.  You vomit blood.  You have bright red blood in your stools.  You have black, tarry stools.    This information is not intended to replace advice given to you by your health care provider. Make sure you discuss any questions you have with your health care provider.   Document Released: 09/25/2003 Document Revised: 07/26/2014 Document Reviewed: 06/22/2013 Elsevier Interactive Patient Education 2016 [place] Maintenance, Female Adopting a healthy lifestyle and getting preventive care can go a long way to promote health and wellness. Talk with your health care provider about what schedule of regular examinations is right for you. This is a good chance for you to check in with your provider about disease prevention and staying healthy. In between checkups, there are plenty of things you can do on your own. Experts have done a lot of research about which lifestyle changes and preventive measures are most likely to keep you healthy. Ask your health care provider for more information. WEIGHT AND DIET  Eat a healthy diet  Be sure to include plenty of vegetables, fruits, low-fat dairy products, and lean protein.  Do not eat a lot of foods high in solid fats, added sugars, or salt.  Get regular exercise. This is one of the most important things you can do for your health.  Most adults should exercise for at least 150 minutes each week. The exercise should increase your heart rate and make you sweat (moderate-intensity exercise).  Most adults should also do strengthening exercises at least twice a week. This is in addition to the moderate-intensity exercise.  Maintain a healthy weight  Body mass index (BMI) is a measurement that can  be used to identify possible weight problems. It estimates body fat based on height and weight. Your health care provider can help determine your BMI and help you achieve or maintain a healthy weight.  For females 49 years of age and older:   A BMI below 18.5 is considered underweight.  A BMI of 18.5 to 24.9 is normal.  A BMI of 25 to 29.9 is considered overweight.  A BMI of 30 and above is considered obese.  Watch levels of cholesterol and blood lipids  You should start having your blood tested for lipids and cholesterol at 49 years of age, then have this test every 5 years.  You may need to have your cholesterol levels checked more often if:  Your lipid or cholesterol levels are high.  You are older than 49 years of age.  You are at high risk for heart disease.  CANCER SCREENING   Lung Cancer  Lung cancer screening is recommended for adults 61-72 years old who are at high risk for lung cancer because of a history of smoking.  A yearly low-dose CT  scan of the lungs is recommended for people who:  Currently smoke.  Have quit within the past 15 years.  Have at least a 30-pack-year history of smoking. A pack year is smoking an average of one pack of cigarettes a day for 1 year.  Yearly screening should continue until it has been 15 years since you quit.  Yearly screening should stop if you develop a health problem that would prevent you from having lung cancer treatment.  Breast Cancer  Practice breast self-awareness. This means understanding how your breasts normally appear and feel.  It also means doing regular breast self-exams. Let your health care provider know about any changes, no matter how small.  If you are in your 20s or 30s, you should have a clinical breast exam (CBE) by a health care provider every 1-3 years as part of a regular health exam.  If you are 30 or older, have a CBE every year. Also consider having a breast X-ray (mammogram) every year.  If  you have a family history of breast cancer, talk to your health care provider about genetic screening.  If you are at high risk for breast cancer, talk to your health care provider about having an MRI and a mammogram every year.  Breast cancer gene (BRCA) assessment is recommended for women who have family members with BRCA-related cancers. BRCA-related cancers include:  Breast.  Ovarian.  Tubal.  Peritoneal cancers.  Results of the assessment will determine the need for genetic counseling and BRCA1 and BRCA2 testing. Cervical Cancer Your health care provider may recommend that you be screened regularly for cancer of the pelvic organs (ovaries, uterus, and vagina). This screening involves a pelvic examination, including checking for microscopic changes to the surface of your cervix (Pap test). You may be encouraged to have this screening done every 3 years, beginning at age 54.  For women ages 36-65, health care providers may recommend pelvic exams and Pap testing every 3 years, or they may recommend the Pap and pelvic exam, combined with testing for human papilloma virus (HPV), every 5 years. Some types of HPV increase your risk of cervical cancer. Testing for HPV may also be done on women of any age with unclear Pap test results.  Other health care providers may not recommend any screening for nonpregnant women who are considered low risk for pelvic cancer and who do not have symptoms. Ask your health care provider if a screening pelvic exam is right for you.  If you have had past treatment for cervical cancer or a condition that could lead to cancer, you need Pap tests and screening for cancer for at least 20 years after your treatment. If Pap tests have been discontinued, your risk factors (such as having a new sexual partner) need to be reassessed to determine if screening should resume. Some women have medical problems that increase the chance of getting cervical cancer. In these cases,  your health care provider may recommend more frequent screening and Pap tests. Colorectal Cancer  This type of cancer can be detected and often prevented.  Routine colorectal cancer screening usually begins at 49 years of age and continues through 49 years of age.  Your health care provider may recommend screening at an earlier age if you have risk factors for colon cancer.  Your health care provider may also recommend using home test kits to check for hidden blood in the stool.  A small camera at the end of a tube can be used  to examine your colon directly (sigmoidoscopy or colonoscopy). This is done to check for the earliest forms of colorectal cancer.  Routine screening usually begins at age 9.  Direct examination of the colon should be repeated every 5-10 years through 49 years of age. However, you may need to be screened more often if early forms of precancerous polyps or small growths are found. Skin Cancer  Check your skin from head to toe regularly.  Tell your health care provider about any new moles or changes in moles, especially if there is a change in a mole's shape or color.  Also tell your health care provider if you have a mole that is larger than the size of a pencil eraser.  Always use sunscreen. Apply sunscreen liberally and repeatedly throughout the day.  Protect yourself by wearing long sleeves, pants, a wide-brimmed hat, and sunglasses whenever you are outside. HEART DISEASE, DIABETES, AND HIGH BLOOD PRESSURE   High blood pressure causes heart disease and increases the risk of stroke. High blood pressure is more likely to develop in:  People who have blood pressure in the high end of the normal range (130-139/85-89 mm Hg).  People who are overweight or obese.  People who are African American.  If you are 20-31 years of age, have your blood pressure checked every 3-5 years. If you are 70 years of age or older, have your blood pressure checked every year. You  should have your blood pressure measured twice--once when you are at a hospital or clinic, and once when you are not at a hospital or clinic. Record the average of the two measurements. To check your blood pressure when you are not at a hospital or clinic, you can use:  An automated blood pressure machine at a pharmacy.  A home blood pressure monitor.  If you are between 49 years and 82 years old, ask your health care provider if you should take aspirin to prevent strokes.  Have regular diabetes screenings. This involves taking a blood sample to check your fasting blood sugar level.  If you are at a normal weight and have a low risk for diabetes, have this test once every three years after 49 years of age.  If you are overweight and have a high risk for diabetes, consider being tested at a younger age or more often. PREVENTING INFECTION  Hepatitis B  If you have a higher risk for hepatitis B, you should be screened for this virus. You are considered at high risk for hepatitis B if:  You were born in a country where hepatitis B is common. Ask your health care provider which countries are considered high risk.  Your parents were born in a high-risk country, and you have not been immunized against hepatitis B (hepatitis B vaccine).  You have HIV or AIDS.  You use needles to inject street drugs.  You live with someone who has hepatitis B.  You have had sex with someone who has hepatitis B.  You get hemodialysis treatment.  You take certain medicines for conditions, including cancer, organ transplantation, and autoimmune conditions. Hepatitis C  Blood testing is recommended for:  Everyone born from [birth year] through 1964.  Anyone with known risk factors for hepatitis C. Sexually transmitted infections (STIs)  You should be screened for sexually transmitted infections (STIs) including gonorrhea and chlamydia if:  You are sexually active and are younger than 49 years of age.  You  are older than 49 years of age and your  health care provider tells you that you are at risk for this type of infection.  Your sexual activity has changed since you were last screened and you are at an increased risk for chlamydia or gonorrhea. Ask your health care provider if you are at risk.  If you do not have HIV, but are at risk, it may be recommended that you take a prescription medicine daily to prevent HIV infection. This is called pre-exposure prophylaxis (PrEP). You are considered at risk if:  You are sexually active and do not regularly use condoms or know the HIV status of your partner(s).  You take drugs by injection.  You are sexually active with a partner who has HIV. Talk with your health care provider about whether you are at high risk of being infected with HIV. If you choose to begin PrEP, you should first be tested for HIV. You should then be tested every 3 months for as long as you are taking PrEP.  PREGNANCY   If you are premenopausal and you may become pregnant, ask your health care provider about preconception counseling.  If you may become pregnant, take 400 to 800 micrograms (mcg) of folic acid every day.  If you want to prevent pregnancy, talk to your health care provider about birth control (contraception). OSTEOPOROSIS AND MENOPAUSE   Osteoporosis is a disease in which the bones lose minerals and strength with aging. This can result in serious bone fractures. Your risk for osteoporosis can be identified using a bone density scan.  If you are 59 years of age or older, or if you are at risk for osteoporosis and fractures, ask your health care provider if you should be screened.  Ask your health care provider whether you should take a calcium or vitamin D supplement to lower your risk for osteoporosis.  Menopause may have certain physical symptoms and risks.  Hormone replacement therapy may reduce some of these symptoms and risks. Talk to your health care  provider about whether hormone replacement therapy is right for you.  HOME CARE INSTRUCTIONS   Schedule regular health, dental, and eye exams.  Stay current with your immunizations.   Do not use any tobacco products including cigarettes, chewing tobacco, or electronic cigarettes.  If you are pregnant, do not drink alcohol.  If you are breastfeeding, limit how much and how often you drink alcohol.  Limit alcohol intake to no more than 1 drink per day for nonpregnant women. One drink equals 12 ounces of beer, 5 ounces of wine, or 1 ounces of hard liquor.  Do not use street drugs.  Do not share needles.  Ask your health care provider for help if you need support or information about quitting drugs.  Tell your health care provider if you often feel depressed.  Tell your health care provider if you have ever been abused or do not feel safe at home.   This information is not intended to replace advice given to you by your health care provider. Make sure you discuss any questions you have with your health care provider.   Document Released: 01/18/2011 Document Revised: 07/26/2014 Document Reviewed: 06/06/2013 Elsevier Interactive Patient Education [employer].

## 2016-05-11 ENCOUNTER — Ambulatory Visit: Payer: 59 | Admitting: Gastroenterology

## 2016-05-11 NOTE — Assessment & Plan Note (Signed)
Diagnosis explained.  Has EGD and Gen Surg follow up by recent ER evaluation

## 2016-05-11 NOTE — Assessment & Plan Note (Signed)
Annual comprehensive preventive exam was done as well as an evaluation and management of chronic conditions .  During the course of the visit the patient was educated and counseled about appropriate screening and preventive services including :  diabetes screening, lipid analysis with projected  10 year  risk for CAD , nutrition counseling, breast, cervical and colorectal cancer screening, and recommended immunizations.  Printed recommendations for health maintenance screenings was given 

## 2016-05-11 NOTE — Discharge Instructions (Signed)

## 2016-05-12 ENCOUNTER — Encounter: Payer: Self-pay | Admitting: Internal Medicine

## 2016-05-12 ENCOUNTER — Ambulatory Visit: Payer: 59 | Admitting: Anesthesiology

## 2016-05-12 ENCOUNTER — Encounter
Admission: RE | Disposition: A | Discharge status: Home / Self Care | Payer: Self-pay | Source: Ambulatory Visit | Attending: Gastroenterology

## 2016-05-12 ENCOUNTER — Ambulatory Visit
Admission: RE | Admit: 2016-05-12 | Discharge: 2016-05-12 | Disposition: A | Discharge status: Home / Self Care | Payer: 59 | Source: Ambulatory Visit | Attending: Gastroenterology | Admitting: Gastroenterology

## 2016-05-12 ENCOUNTER — Other Ambulatory Visit: Payer: Self-pay | Admitting: Gastroenterology

## 2016-05-12 ENCOUNTER — Ambulatory Visit: Admit: 2016-05-12 | Payer: 59 | Admitting: Gastroenterology

## 2016-05-12 DIAGNOSIS — I10 Essential (primary) hypertension: Secondary | ICD-10-CM | POA: Diagnosis not present

## 2016-05-12 DIAGNOSIS — K219 Gastro-esophageal reflux disease without esophagitis: Secondary | ICD-10-CM | POA: Insufficient documentation

## 2016-05-12 DIAGNOSIS — B9681 Helicobacter pylori [H. pylori] as the cause of diseases classified elsewhere: Secondary | ICD-10-CM | POA: Insufficient documentation

## 2016-05-12 DIAGNOSIS — Z79899 Other long term (current) drug therapy: Secondary | ICD-10-CM | POA: Diagnosis not present

## 2016-05-12 DIAGNOSIS — K2930 Chronic superficial gastritis without bleeding: Secondary | ICD-10-CM | POA: Diagnosis not present

## 2016-05-12 DIAGNOSIS — R1011 Right upper quadrant pain: Secondary | ICD-10-CM | POA: Diagnosis not present

## 2016-05-12 DIAGNOSIS — K449 Diaphragmatic hernia without obstruction or gangrene: Secondary | ICD-10-CM | POA: Diagnosis not present

## 2016-05-12 DIAGNOSIS — I447 Left bundle-branch block, unspecified: Secondary | ICD-10-CM | POA: Diagnosis not present

## 2016-05-12 DIAGNOSIS — R1013 Epigastric pain: Secondary | ICD-10-CM

## 2016-05-12 DIAGNOSIS — Z8249 Family history of ischemic heart disease and other diseases of the circulatory system: Secondary | ICD-10-CM | POA: Insufficient documentation

## 2016-05-12 DIAGNOSIS — K2970 Gastritis, unspecified, without bleeding: Secondary | ICD-10-CM

## 2016-05-12 DIAGNOSIS — G4700 Insomnia, unspecified: Secondary | ICD-10-CM | POA: Insufficient documentation

## 2016-05-12 HISTORY — DX: Personal history of other diseases of the digestive system: Z87.19

## 2016-05-12 HISTORY — DX: Left bundle-branch block, unspecified: I44.7

## 2016-05-12 HISTORY — DX: Gastro-esophageal reflux disease without esophagitis: K21.9

## 2016-05-12 HISTORY — DX: Presence of external hearing-aid: Z97.4

## 2016-05-12 HISTORY — PX: ESOPHAGOGASTRODUODENOSCOPY (EGD) WITH PROPOFOL: SHX5813

## 2016-05-12 HISTORY — DX: Other specified postprocedural states: Z98.890

## 2016-05-12 HISTORY — DX: Other specified postprocedural states: R11.2

## 2016-05-12 HISTORY — DX: Presence of spectacles and contact lenses: Z97.3

## 2016-05-12 SURGERY — ESOPHAGOGASTRODUODENOSCOPY (EGD) WITH PROPOFOL
Anesthesia: Monitor Anesthesia Care | Wound class: Clean Contaminated

## 2016-05-12 SURGERY — ESOPHAGOGASTRODUODENOSCOPY (EGD) WITH PROPOFOL
Anesthesia: Choice

## 2016-05-12 MED ORDER — ACETAMINOPHEN 160 MG/5ML PO SOLN: 325.0000 mg | ORAL | Status: DC | PRN

## 2016-05-12 MED ORDER — GLYCOPYRROLATE 0.2 MG/ML IJ SOLN
INTRAMUSCULAR | Status: DC | PRN
  Administered 2016-05-12: 0.2 mg via INTRAVENOUS

## 2016-05-12 MED ORDER — PROPOFOL 10 MG/ML IV BOLUS
INTRAVENOUS | Status: DC | PRN
  Administered 2016-05-12: 70 mg via INTRAVENOUS
  Administered 2016-05-12: 30 mg via INTRAVENOUS
  Administered 2016-05-12 (×3): 20 mg via INTRAVENOUS
  Administered 2016-05-12: 30 mg via INTRAVENOUS

## 2016-05-12 MED ORDER — ACETAMINOPHEN 325 MG PO TABS
325.0000 mg | ORAL_TABLET | ORAL | Status: DC | PRN
Start: 2016-05-12 — End: 2016-05-12

## 2016-05-12 MED ORDER — ERGOCALCIFEROL 1.25 MG (50000 UT) PO CAPS: 50000.0000 [IU] | ORAL_CAPSULE | ORAL | 2 refills | Status: DC

## 2016-05-12 MED ORDER — LIDOCAINE HCL (CARDIAC) 20 MG/ML IV SOLN
INTRAVENOUS | Status: DC | PRN
  Administered 2016-05-12: 50 mg via INTRAVENOUS

## 2016-05-12 MED ORDER — CLARITHROMYCIN 250 MG PO TABS: 500.0000 mg | ORAL_TABLET | Freq: Two times a day (BID) | ORAL | 0 refills | Status: DC

## 2016-05-12 MED ORDER — AMOXICILLIN 500 MG PO CAPS: 1000.0000 mg | ORAL_CAPSULE | Freq: Two times a day (BID) | ORAL | 0 refills | Status: DC

## 2016-05-12 MED ORDER — LACTATED RINGERS IV SOLN
INTRAVENOUS | Status: DC
  Administered 2016-05-12: 08:00:00 via INTRAVENOUS

## 2016-05-12 SURGICAL SUPPLY — 7 items
BLOCK BITE 60FR ADLT L/F GRN (MISCELLANEOUS) ×2 IMPLANT
CANISTER SUCT 1200ML W/VALVE (MISCELLANEOUS) ×2 IMPLANT
FORCEPS BIOP RAD 4 LRG CAP 4 (CUTTING FORCEPS) ×2 IMPLANT
GOWN CVR UNV OPN BCK APRN NK (MISCELLANEOUS) IMPLANT
GOWN ISOL THUMB LOOP REG UNIV (MISCELLANEOUS) ×6
KIT ENDO PROCEDURE OLY (KITS) ×2 IMPLANT
WATER STERILE IRR 250ML POUR (IV SOLUTION) ×2 IMPLANT

## 2016-05-12 NOTE — Transfer of Care (Signed)
Immediate Anesthesia Transfer of Care Note  Patient: Mary Bryant  Procedure(s) Performed: Procedure(s): ESOPHAGOGASTRODUODENOSCOPY (EGD) WITH PROPOFOL (N/A)  Patient Location: PACU  Anesthesia Type: MAC  Level of Consciousness: awake, alert  and patient cooperative  Airway and Oxygen Therapy: Patient Spontanous Breathing and Patient connected to supplemental oxygen  Post-op Assessment: Post-op Vital signs reviewed, Patient's Cardiovascular Status Stable, Respiratory Function Stable, Patent Airway and No signs of Nausea or vomiting  Post-op Vital Signs: Reviewed and stable  Complications: No apparent anesthesia complications

## 2016-05-12 NOTE — Op Note (Signed)
[facility] Gastroenterology Patient Name: Mary Bryant Procedure Date: 05/12/2016 7:22 AM MRN: [id number] Account #: [id number] Date of Birth: [birth year] Admit Type: Outpatient Age: 50 Room: [facility] OR ROOM 01 Gender: Female Note Status: Finalized Procedure:            Upper GI endoscopy Indications:          Epigastric abdominal pain, Abdominal pain in the right                        upper quadrant Providers:            Jonathon Bellows MD, MD Referring MD:         Deborra Medina, MD (Referring MD) Complications:        No immediate complications. Procedure:            Pre-Anesthesia Assessment:                       - ASA Grade Assessment: II - A patient with mild                        systemic disease.                       - The anesthesia plan was to use monitored anesthesia                        care (MAC).                       - Pre-procedure physical examination revealed no                        contraindications to sedation.                       After obtaining informed consent, the endoscope was                        passed under direct vision. Throughout the procedure,                        the patient's blood pressure, pulse, and oxygen                        saturations were monitored continuously. The Olympus                        GIF H180J endoscope (S#: [id number]) was introduced                        through the mouth, and advanced to the third part of                        duodenum. The upper GI endoscopy was accomplished with                        ease. The patient tolerated the procedure well. Findings:      The esophagus was normal.      The stomach was normal.      The examined duodenum was normal. Impression:           -  Normal esophagus.                       - Normal stomach.                       - Normal examined duodenum.                       - No specimens collected.                       - Normal esophagus, stomach, and  examined duodenum.                        Biopsied.                       - Normal greater curvature of the gastric antrum and                        stomach.                       - 5 cm sliding hiatal hernia. [All Maneuvers]. Recommendation:       - Resume previous diet.                       - Await pathology results.                       - Discharge patient to home (with escort).                       - Written discharge instructions were provided to the                        patient.                       - Resume regular diet daily.                       - Return to GI office in 4 weeks. Procedure Code(s):    --- Professional ---                       [phone], Esophagogastroduodenoscopy, flexible, transoral;                        diagnostic, including collection of specimen(s) by                        brushing or washing, when performed (separate procedure) Diagnosis Code(s):    --- Professional ---                       R10.11, Right upper quadrant pain                       K44.9, Diaphragmatic hernia without obstruction or                        gangrene                       R10.13, Epigastric pain  CPT copyright 2016 American Medical Association. All rights reserved. The codes documented in this report are preliminary and upon coder review may  be revised to meet current compliance requirements. Attending Participation:      I personally performed the entire procedure. Jonathon Bellows, MD Jonathon Bellows MD, MD 05/12/2016 8:06:13 AM This report has been signed electronically. Number of Addenda: 0 Note Initiated On: 05/12/2016 7:22 AM Total Procedure Duration: 0 hours 5 minutes 34 seconds       [facility]

## 2016-05-12 NOTE — Assessment & Plan Note (Signed)
By serologies.  Will treat.

## 2016-05-12 NOTE — H&P (Addendum)
Primary Care Physician:  Crecencio Mc, MD Primary Gastroenterologist:  Dr. Allen Norris  Pre-Procedure History & Physical: HPI:  Mary Bryant is a 50 y.o. female is here for an endoscopy.She has had abdominal pain for about a month, denies any NSAID use. Pain is still present.    Past Medical History:  Diagnosis Date  . Esophageal stricture    dilated 2009,  [facility]  . GERD (gastroesophageal reflux disease)   . History of hiatal hernia   . Hypertension    no prior treatment  . Insomnia    chronic, no prior sleep study  . Left bundle branch block   . PONV (postoperative nausea and vomiting)   . rhinitis allergic   . Wears contact lenses   . Wears hearing aid    bilateral    Past Surgical History:  Procedure Laterality Date  . ABDOMINAL HYSTERECTOMY  2000   no history of ca   . CHOLECYSTECTOMY  2008    Prior to Admission medications   Medication Sig Start Date End Date Taking? Authorizing Provider  acetaminophen (TYLENOL) 500 MG tablet Take 1,000 mg by mouth every 6 (six) hours as needed for mild pain.   Yes Historical Provider, MD  acyclovir (ZOVIRAX) 400 MG tablet TAKE 1 TABLET (400 MG TOTAL) BY MOUTH DAILY AS NEEDED (FEVER BLISTERS). 11/26/15  Yes Crecencio Mc, MD  acyclovir ointment (ZOVIRAX) 5 % Apply 1 application topically every 3 (three) hours. 12/09/14  Yes Crecencio Mc, MD  ALPRAZolam Duanne Moron) 0.5 MG tablet TAKE 1 TABLET BY MOUTH ONCE DAILY 03/08/16  Yes Crecencio Mc, MD  carbamazepine (TEGRETOL) 200 MG tablet TAKE 1 TABLET (200 MG TOTAL) BY MOUTH 3 (THREE) TIMES DAILY. 10/20/15  Yes Crecencio Mc, MD  escitalopram (LEXAPRO) 10 MG tablet TAKE 1 TABLET BY MOUTH ONCE DAILY 03/08/16  Yes Crecencio Mc, MD  HYDROcodone-acetaminophen (NORCO/VICODIN) 5-325 MG per tablet Take 1 tablet by mouth every 6 (six) hours as needed for moderate pain. 04/04/15  Yes Coral Spikes, DO  metoprolol succinate (TOPROL-XL) 25 MG 24 hr tablet TAKE 1 TABLET BY MOUTH ONCE DAILY 04/19/16  Yes  Crecencio Mc, MD  ondansetron (ZOFRAN ODT) 4 MG disintegrating tablet Take 1 tablet (4 mg total) by mouth every 8 (eight) hours as needed for nausea or vomiting. 05/07/16  Yes Rudene Re, MD  pantoprazole (PROTONIX) 20 MG tablet Take 1 tablet (20 mg total) by mouth daily. 05/07/16 05/07/17 Yes Rudene Re, MD  calcium carbonate (TUMS) 500 MG chewable tablet Chew 1 tablet (200 mg of elemental calcium total) by mouth 3 (three) times daily with meals. Patient not taking: Reported on 05/12/2016 05/07/16 06/06/16  Rudene Re, MD  cyanocobalamin 1000 MCG tablet Take 100 mcg by mouth daily.    Historical Provider, MD    Allergies as of 05/07/2016 - Review Complete 05/07/2016  Allergen Reaction Noted  . Promethazine Anaphylaxis 04/04/2015  . Benadryl [diphenhydramine hcl] Hives 05/17/2011  . Promethazine hcl Hives 05/17/2011    Family History  Problem Relation Age of Onset  . Cancer Father     Bladder,  in Hospice  . Diabetes type II Father   . COPD Father   . Coronary artery disease Father   . Heart disease Father 23  . Diabetes Mother   . Hyperlipidemia Mother   . Hypertension Mother   . Early death Paternal Grandmother   . Heart disease Paternal Grandmother     CAD  Social History   Social History  . Marital status: Single    Spouse name: Viki Scurto  . Number of children: 2  . Years of education: 14   Occupational History  .  [employer]   Social History Main Topics  . Smoking status: Never Smoker  . Smokeless tobacco: Never Used  . Alcohol use No  . Drug use: No  . Sexual activity: Not Currently   Other Topics Concern  . 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   Has he  r CNA but doesnot use it.     Review of Systems: See HPI, otherwise negative ROS  Physical Exam: BP 137/88   Pulse (!) 57   Temp 97.7 F (36.5  C) (Temporal)   Resp 16   Ht 5\' 6"  (1.676 m)   Wt 197 lb (89.4 kg)   SpO2 97%   BMI 31.80 kg/m  General:   Alert,  pleasant and cooperative in NAD Head:  Normocephalic and atraumatic. Neck:  Supple; no masses or thyromegaly. Lungs:  Clear throughout to auscultation.    Heart:  Regular rate and rhythm. Abdomen:  Soft, nontender and nondistended. Normal bowel sounds, without guarding, and without rebound.   Neurologic:  Alert and  oriented x4;  grossly normal neurologically.  Impression/Plan: Mary Bryant is here for an endoscopy to be performed for abdominal pain   Risks, benefits, limitations, and alternatives regarding  endoscopy have been reviewed with the patient.  Questions have been answered.  All parties agreeable.   Jonathon Bellows, MD  05/12/2016, 7:33 AM

## 2016-05-12 NOTE — Anesthesia Postprocedure Evaluation (Signed)
Anesthesia Post Note  Patient: Mary Bryant  Procedure(s) Performed: Procedure(s) (LRB): ESOPHAGOGASTRODUODENOSCOPY (EGD) WITH PROPOFOL (N/A)  Patient location during evaluation: PACU Anesthesia Type: MAC Level of consciousness: awake and alert and oriented Pain management: satisfactory to patient Vital Signs Assessment: post-procedure vital signs reviewed and stable Respiratory status: spontaneous breathing, nonlabored ventilation and respiratory function stable Cardiovascular status: blood pressure returned to baseline and stable Postop Assessment: Adequate PO intake and No signs of nausea or vomiting Anesthetic complications: no    [name]

## 2016-05-12 NOTE — Anesthesia Preprocedure Evaluation (Signed)
Anesthesia Evaluation  Patient identified by MRN, date of birth, ID band Patient awake    Reviewed: Allergy & Precautions, H&P , NPO status , Patient's Chart, lab work & pertinent test results  History of Anesthesia Complications (+) PONV and history of anesthetic complications  Airway Mallampati: II  TM Distance: >3 FB Neck ROM: full    Dental no notable dental hx.    Pulmonary    Pulmonary exam normal        Cardiovascular hypertension, + CAD  Normal cardiovascular exam     Neuro/Psych PSYCHIATRIC DISORDERS    GI/Hepatic hiatal hernia, GERD  ,  Endo/Other    Renal/GU      Musculoskeletal   Abdominal   Peds  Hematology   Anesthesia Other Findings   Reproductive/Obstetrics                             Anesthesia Physical Anesthesia Plan  ASA: II  Anesthesia Plan: MAC   Post-op Pain Management:    Induction:   Airway Management Planned:   Additional Equipment:   Intra-op Plan:   Post-operative Plan:   Informed Consent: I have reviewed the patients History and Physical, chart, labs and discussed the procedure including the risks, benefits and alternatives for the proposed anesthesia with the patient or authorized representative who has indicated his/her understanding and acceptance.     Plan Discussed with:   Anesthesia Plan Comments:         Anesthesia Quick Evaluation

## 2016-05-12 NOTE — Anesthesia Procedure Notes (Signed)
Procedure Name: MAC Performed by: Brunette Lavalle Pre-anesthesia Checklist: Patient identified, Emergency Drugs available, Suction available, Timeout performed and Patient being monitored Patient Re-evaluated:Patient Re-evaluated prior to inductionOxygen Delivery Method: Nasal cannula Placement Confirmation: positive ETCO2       

## 2016-05-12 NOTE — Addendum Note (Signed)
Addended by: Crecencio Mc on: 05/12/2016 10:26 PM   Modules accepted: Orders

## 2016-05-13 ENCOUNTER — Encounter: Payer: Self-pay | Admitting: Gastroenterology

## 2016-05-13 ENCOUNTER — Telehealth: Payer: Self-pay | Admitting: Gastroenterology

## 2016-05-13 NOTE — Telephone Encounter (Signed)
Advised pt she will be working with the surgical nurses on Monday during her follow up appt with Dr. Vicente Males. Advised her I would speak with them about this or Dr. Vicente Males will discuss with the nurses to set her up for a surgical consultation for a hernia repair.

## 2016-05-13 NOTE — Telephone Encounter (Signed)
Pt had an EGD done on 05/12/16 at the [facility] and called stating that MD told her the office would be in contact with her regarding what her next steps would be. Please advise.

## 2016-05-14 ENCOUNTER — Other Ambulatory Visit: Payer: Self-pay

## 2016-05-17 ENCOUNTER — Ambulatory Visit (INDEPENDENT_AMBULATORY_CARE_PROVIDER_SITE_OTHER): Payer: 59 | Admitting: Gastroenterology

## 2016-05-17 ENCOUNTER — Encounter: Payer: Self-pay | Admitting: Gastroenterology

## 2016-05-17 ENCOUNTER — Other Ambulatory Visit
Admission: RE | Admit: 2016-05-17 | Discharge: 2016-05-17 | Disposition: A | Discharge status: Home / Self Care | Payer: 59 | Source: Ambulatory Visit | Attending: Gastroenterology | Admitting: Gastroenterology

## 2016-05-17 VITALS — BP 137/85 | HR 64 | Temp 98.2°F | Ht 67.0 in | Wt 198.0 lb

## 2016-05-17 DIAGNOSIS — M791 Myalgia: Secondary | ICD-10-CM | POA: Insufficient documentation

## 2016-05-17 DIAGNOSIS — M7918 Myalgia, other site: Secondary | ICD-10-CM | POA: Insufficient documentation

## 2016-05-17 NOTE — Patient Instructions (Addendum)

## 2016-05-17 NOTE — Progress Notes (Signed)
Gastroenterology Consultation  Referring Provider:     Crecencio Mc, MD Primary Care Physician:  Crecencio Mc, MD Primary Gastroenterologist:  Dr. Jonathon Bellows  Reason for Consultation:     Abdominal pain        HPI:   Mary Bryant is a 50 y.o. y/o female referred for consultation & management  by Dr. Crecencio Mc, MD.     I performed an EGD last week on her for abdominal pain and it was normal except for a 5 cm hiatal hernia. Biopsies were taken randomly from her gastric mucosa and the results are still awaited. She has had a recent H pylori IGG which has returned positive. CT scan of the abdomen in 02/2016 was normal except for demonstrating a hiatal hernia. LFT/BMP/CBC in 04/2016 were normal. She has presented to the ER in 02/2016 and 04/2016 for the same reasons .   Abdominal pain: Onset about one month back  Site Points to the right side of her abdomen similar to her gall bladder pain/  Radiation localized Severity upto 11/10 Nature of painpressure , continious, does not wake her up from her sleep  Aggravating factors: movement , not related to stress.  Relieving factors movement : she also does have back pain  Weight loss : gained 8 lbs  NSAID use : none  PPI use Prilosec  Gall bladder surgery : cholecystectomy :  Frequency of bowel movements: daily, soft , no change of pain after a bowel movement .  Change in bowel movements none  Relief with bowel movements none  Gas/Bloating/Abdominal distension : none    Last colonoscopy by Dr Allen Norris < 5 years back  Past Medical History:  Diagnosis Date  . Esophageal stricture    dilated 2009,  [facility]  . GERD (gastroesophageal reflux disease)   . History of hiatal hernia   . Hypertension    no prior treatment  . Insomnia    chronic, no prior sleep study  . Left bundle branch block   . PONV (postoperative nausea and vomiting)   . rhinitis allergic   . Wears contact lenses   . Wears hearing aid    bilateral    Past  Surgical History:  Procedure Laterality Date  . ABDOMINAL HYSTERECTOMY  2000   no history of ca   . CHOLECYSTECTOMY  2008  . ESOPHAGOGASTRODUODENOSCOPY (EGD) WITH PROPOFOL N/A 05/12/2016   Procedure: ESOPHAGOGASTRODUODENOSCOPY (EGD) WITH PROPOFOL;  Surgeon: Jonathon Bellows, MD;  Location: [facility];  Service: Endoscopy;  Laterality: N/A;    Prior to Admission medications   Medication Sig Start Date End Date Taking? Authorizing Provider  acetaminophen (TYLENOL) 500 MG tablet Take 1,000 mg by mouth every 6 (six) hours as needed for mild pain.   Yes Historical Provider, MD  acyclovir (ZOVIRAX) 400 MG tablet TAKE 1 TABLET (400 MG TOTAL) BY MOUTH DAILY AS NEEDED (FEVER BLISTERS). 11/26/15  Yes Crecencio Mc, MD  acyclovir ointment (ZOVIRAX) 5 % Apply 1 application topically every 3 (three) hours. 12/09/14  Yes Crecencio Mc, MD  ALPRAZolam Duanne Moron) 0.5 MG tablet TAKE 1 TABLET BY MOUTH ONCE DAILY 03/08/16  Yes Crecencio Mc, MD  amoxicillin (AMOXIL) 500 MG capsule Take 2 capsules (1,000 mg total) by mouth 2 (two) times daily. 05/12/16  Yes Crecencio Mc, MD  calcium carbonate (TUMS) 500 MG chewable tablet Chew 1 tablet (200 mg of elemental calcium total) by mouth 3 (three) times daily with meals. 05/07/16 06/06/16 Yes  Rudene Re, MD  carbamazepine (TEGRETOL) 200 MG tablet TAKE 1 TABLET (200 MG TOTAL) BY MOUTH 3 (THREE) TIMES DAILY. 10/20/15  Yes Crecencio Mc, MD  clarithromycin (BIAXIN) 250 MG tablet Take 2 tablets (500 mg total) by mouth 2 (two) times daily. 05/12/16  Yes Crecencio Mc, MD  cyanocobalamin 1000 MCG tablet Take 100 mcg by mouth daily.   Yes Historical Provider, MD  ergocalciferol (VITAMIN D2) 50000 units capsule Take 1 capsule (50,000 Units total) by mouth once a week. 05/12/16  Yes Crecencio Mc, MD  escitalopram (LEXAPRO) 10 MG tablet TAKE 1 TABLET BY MOUTH ONCE DAILY 03/08/16  Yes Crecencio Mc, MD  HYDROcodone-acetaminophen (NORCO/VICODIN) 5-325 MG per tablet Take 1  tablet by mouth every 6 (six) hours as needed for moderate pain. 04/04/15  Yes Coral Spikes, DO  metoprolol succinate (TOPROL-XL) 25 MG 24 hr tablet TAKE 1 TABLET BY MOUTH ONCE DAILY 04/19/16  Yes Crecencio Mc, MD  ondansetron (ZOFRAN ODT) 4 MG disintegrating tablet Take 1 tablet (4 mg total) by mouth every 8 (eight) hours as needed for nausea or vomiting. 05/07/16  Yes Rudene Re, MD  pantoprazole (PROTONIX) 20 MG tablet Take 1 tablet (20 mg total) by mouth daily. 05/07/16 05/07/17 Yes Rudene Re, MD    Family History  Problem Relation Age of Onset  . Cancer Father     Bladder,  in Hospice  . Diabetes type II Father   . COPD Father   . Coronary artery disease Father   . Heart disease Father 52  . Diabetes Mother   . Hyperlipidemia Mother   . Hypertension Mother   . Early death Paternal Grandmother   . Heart disease Paternal Grandmother     CAD     Social History  Substance Use Topics  . Smoking status: Never Smoker  . Smokeless tobacco: Never Used  . Alcohol use No    Allergies as of 05/17/2016 - Review Complete 05/17/2016  Allergen Reaction Noted  . Promethazine Anaphylaxis 04/04/2015  . Benadryl [diphenhydramine hcl] Hives 05/17/2011  . Promethazine hcl Hives 05/17/2011    Review of Systems:    All systems reviewed and negative except where noted in HPI.   Physical Exam:  BP 137/85 (BP Location: Left Arm, Patient Position: Sitting)   Pulse 64   Temp 98.2 F (36.8 C) (Oral)   Ht 5\' 7"  (1.702 m)   Wt 198 lb (89.8 kg)   BMI 31.01 kg/m  No LMP recorded. Patient has had a hysterectomy. Psych:  Alert and cooperative. Normal mood and affect. General:   Alert,  Well-developed, ,well-nourished, pleasant and cooperative in NAD Head:  Normocephalic and atraumatic. Eyes:  Sclera clear, no icterus.   Conjunctiva pink. Ears:  Normal auditory acuity. Nose:  No deformity, discharge, or lesions. Mouth:  No deformity or lesions,oropharynx pink & moist. Neck:   Supple; no masses or thyromegaly. Lungs:  Respirations even and unlabored.  Clear throughout to auscultation.   No wheezes, crackles, or rhonchi. No acute distress. Heart:  Regular rate and rhythm; no murmurs, clicks, rubs, or gallops. Abdomen:  Normal bowel sounds.  No bruits.  Soft, non-tender and non-distended without masses, hepatosplenomegaly or hernias noted.  No guarding or rebound tenderness.    Msk:  Symmetrical without gross deformities. Good, equal movement & strength bilaterally.Tenderness over right ;lower ribs at the costocondral junction with reproducible tendernes Pulses:  Normal pulses noted. Extremities:  No clubbing or edema.  No cyanosis. Neurologic:  Alert  and oriented x3;  grossly normal neurologically. Skin:  Intact without significant lesions or rashes.  No jaundice. Lymph Nodes:  No significant cervical adenopathy. Psych:  Alert and cooperative. Normal mood and affect.  Imaging Studies: No results found.  Assessment and Plan:   Mary Bryant is a 50 y.o. y/o female has been referred for abdominal pain.  On examination she is tender over her right ribs at the costochondral junction suggestive of costocondritis. She also has a positive H pylori antibody .she hasnt started the antibiotics as yet .   Plan   1. Costocondritis: suggest tyelonol for pain and could also try NSAID's/  2. H pylori test positive: New guidelines suggest checking stool antigen , I will check it and if positive will treat with antibiotics.   Follow up in PRN  Dr Jonathon Bellows MD

## 2016-05-18 ENCOUNTER — Ambulatory Visit (INDEPENDENT_AMBULATORY_CARE_PROVIDER_SITE_OTHER): Payer: 59 | Admitting: General Surgery

## 2016-05-18 ENCOUNTER — Encounter: Payer: Self-pay | Admitting: General Surgery

## 2016-05-18 VITALS — BP 141/76 | HR 95 | Temp 98.2°F | Ht 67.0 in | Wt 200.0 lb

## 2016-05-18 DIAGNOSIS — K449 Diaphragmatic hernia without obstruction or gangrene: Secondary | ICD-10-CM

## 2016-05-18 DIAGNOSIS — K2100 Gastro-esophageal reflux disease with esophagitis, without bleeding: Secondary | ICD-10-CM

## 2016-05-18 NOTE — Patient Instructions (Signed)
We will call you as soon as we get your esophageal manometry scheduled at [facility].

## 2016-05-18 NOTE — Progress Notes (Signed)
Patient ID: ZETHA HSIAO, female   DOB: [birth year], 50 y.o.   MRN: [id number]  CC: Hiatal Hernia  HPI Mary Bryant is a 50 y.o. female who presents to clinic today for evaluation of a hiatal hernia with symptomatic reflux. Patient reports that she's been having chest discomfort and reflux symptoms the last many years. She's had numerous workups for this and numerous different treatments. At some point in the past she had an esophageal stricture that required dilatation. She had a recent endoscopy which showed a sliding hiatal hernia as well as evidence of Helicobacter pylori infection. She is currently under treatment for the H. pylori. She reports that she has a feeling of fullness in her chest that is worsened with lifting or bending at the waist. She has semi-constant nausea and reflux that requires daily medication. If she fails to take her daily medication she has worsening symptoms as well as nausea. She denies any fevers, chills, chest pain, short of breath, diarrhea, constipation. She has a feeling of being ill currently that started when she was placed on treatment for the H. pylori. She is very anxious and has it convinced in her own mind that her problems are from the hiatal hernia. Not had any manometry performed yet.  HPI  Past Medical History:  Diagnosis Date  . Esophageal stricture    dilated 2009,  [facility]  . GERD (gastroesophageal reflux disease)   . History of hiatal hernia   . Hypertension    no prior treatment  . Insomnia    chronic, no prior sleep study  . Left bundle branch block   . PONV (postoperative nausea and vomiting)   . rhinitis allergic   . Wears contact lenses   . Wears hearing aid    bilateral    Past Surgical History:  Procedure Laterality Date  . ABDOMINAL HYSTERECTOMY  2000   no history of ca   . CHOLECYSTECTOMY  2008  . ESOPHAGOGASTRODUODENOSCOPY (EGD) WITH PROPOFOL N/A 05/12/2016   Procedure: ESOPHAGOGASTRODUODENOSCOPY (EGD) WITH PROPOFOL;   Surgeon: Jonathon Bellows, MD;  Location: [facility];  Service: Endoscopy;  Laterality: N/A;    Family History  Problem Relation Age of Onset  . Cancer Father     Bladder,  in Hospice  . Diabetes type II Father   . COPD Father   . Coronary artery disease Father   . Heart disease Father 31  . Diabetes Mother   . Hyperlipidemia Mother   . Hypertension Mother   . Early death Paternal Grandmother   . Heart disease Paternal Grandmother     CAD    Social History Social History  Substance Use Topics  . Smoking status: Never Smoker  . Smokeless tobacco: Never Used  . Alcohol use No    Allergies  Allergen Reactions  . Promethazine Anaphylaxis  . Benadryl [Diphenhydramine Hcl] Hives  . Promethazine Hcl Hives    Current Outpatient Prescriptions  Medication Sig Dispense Refill  . acetaminophen (TYLENOL) 500 MG tablet Take 1,000 mg by mouth every 6 (six) hours as needed for mild pain.    Marland Kitchen ALPRAZolam (XANAX) 0.5 MG tablet TAKE 1 TABLET BY MOUTH ONCE DAILY 30 tablet 2  . amoxicillin (AMOXIL) 500 MG capsule Take 2 capsules (1,000 mg total) by mouth 2 (two) times daily. 40 capsule 0  . calcium carbonate (TUMS) 500 MG chewable tablet Chew 1 tablet (200 mg of elemental calcium total) by mouth 3 (three) times daily with meals. 90 tablet 0  .  carbamazepine (TEGRETOL) 200 MG tablet TAKE 1 TABLET (200 MG TOTAL) BY MOUTH 3 (THREE) TIMES DAILY. 90 tablet 3  . clarithromycin (BIAXIN) 250 MG tablet Take 2 tablets (500 mg total) by mouth 2 (two) times daily. 40 tablet 0  . cyanocobalamin 1000 MCG tablet Take 100 mcg by mouth daily.    . ergocalciferol (VITAMIN D2) 50000 units capsule Take 1 capsule (50,000 Units total) by mouth once a week. 4 capsule 2  . escitalopram (LEXAPRO) 10 MG tablet TAKE 1 TABLET BY MOUTH ONCE DAILY 30 tablet 2  . HYDROcodone-acetaminophen (NORCO/VICODIN) 5-325 MG per tablet Take 1 tablet by mouth every 6 (six) hours as needed for moderate pain. 30 tablet 0  .  metoprolol succinate (TOPROL-XL) 25 MG 24 hr tablet TAKE 1 TABLET BY MOUTH ONCE DAILY 30 tablet 2  . ondansetron (ZOFRAN ODT) 4 MG disintegrating tablet Take 1 tablet (4 mg total) by mouth every 8 (eight) hours as needed for nausea or vomiting. 20 tablet 0  . pantoprazole (PROTONIX) 20 MG tablet Take 1 tablet (20 mg total) by mouth daily. 30 tablet 1   No current facility-administered medications for this visit.      Review of Systems A Multi-point review of systems was asked and was negative except for the findings documented in the history of present illness  Physical Exam Blood pressure (!) 141/76, pulse 95, temperature 98.2 F (36.8 C), temperature source Oral, height 5\' 7"  (1.702 m), weight 90.7 kg (200 lb). CONSTITUTIONAL: No acute distress. EYES: Pupils are equal, round, and reactive to light, Sclera are non-icteric. EARS, NOSE, MOUTH AND THROAT: The oropharynx is clear. The oral mucosa is pink and moist. Hearing is intact to voice. LYMPH NODES:  Lymph nodes in the neck are normal. RESPIRATORY:  Lungs are clear. There is normal respiratory effort, with equal breath sounds bilaterally, and without pathologic use of accessory muscles. CARDIOVASCULAR: Heart is regular without murmurs, gallops, or rubs. GI: The abdomen is soft, nontender, and nondistended. There are no palpable masses. There is no hepatosplenomegaly. There are normal bowel sounds in all quadrants. GU: Rectal deferred.   MUSCULOSKELETAL: Normal muscle strength and tone. No cyanosis or edema.   SKIN: Turgor is good and there are no pathologic skin lesions or ulcers. NEUROLOGIC: Motor and sensation is grossly normal. Cranial nerves are grossly intact. PSYCH:  Oriented to person, place and time. Affect is normal.  Data Reviewed Patient has recent imaging and labs. Labs are all within normal limits with the exception of the H. pylori antigen. She had a CT scan obtained 2 months ago which only shows a hiatal hernia. Upper  endoscopy that was performed last week shows a hiatal hernia as well as gastritis. I have personally reviewed the patient's imaging, laboratory findings and medical records.    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 This was seen on recent endoscopy. Her current complaints however of nausea and feeling ill are unlikely to be related to this sliding hiatal hernia. Discussed with the patient this fact that she voiced understanding. Also discussed with the patient given she's had a history of esophageal strictures as well as dilatation for this that I would need her to obtain manometry prior to proceeding with any operative intervention. The reasons for this including esophageal dysmotility being a possible cause for her upper GI symptoms were described in detail. She voiced understanding  albeit disappointment that she needed to have more workup done before surgery could be offered. We will plan to send her for esophageal manometry to [facility] and she'll return to clinic at that point discussed the findings and whether or not she'll be offered surgery at our facility. Discussed that should she have a true esophageal dysmotility problem that I would defer her care to a tertiary care center. All questions answered and the patient and her mother's satisfaction. She'll follow up in clinic after her manometry.     Time spent with the patient was 45 minutes, with more than 50% of the time spent in face-to-face education, counseling and care coordination.     Clayburn Pert, MD FACS General Surgeon 05/18/2016, 4:03 PM

## 2016-05-19 ENCOUNTER — Telehealth: Payer: Self-pay

## 2016-05-19 ENCOUNTER — Emergency Department: Payer: 59

## 2016-05-19 ENCOUNTER — Inpatient Hospital Stay
Admission: EM | Admit: 2016-05-19 | Discharge: 2016-05-21 | DRG: 392 | Disposition: A | Discharge status: Home / Self Care | Payer: 59 | Attending: Internal Medicine | Admitting: Internal Medicine

## 2016-05-19 ENCOUNTER — Ambulatory Visit: Payer: 59 | Admitting: General Surgery

## 2016-05-19 ENCOUNTER — Encounter: Payer: Self-pay | Admitting: *Deleted

## 2016-05-19 DIAGNOSIS — R51 Headache: Secondary | ICD-10-CM | POA: Diagnosis not present

## 2016-05-19 DIAGNOSIS — G4700 Insomnia, unspecified: Secondary | ICD-10-CM | POA: Diagnosis present

## 2016-05-19 DIAGNOSIS — K219 Gastro-esophageal reflux disease without esophagitis: Secondary | ICD-10-CM | POA: Diagnosis not present

## 2016-05-19 DIAGNOSIS — R112 Nausea with vomiting, unspecified: Secondary | ICD-10-CM | POA: Diagnosis not present

## 2016-05-19 DIAGNOSIS — R109 Unspecified abdominal pain: Secondary | ICD-10-CM | POA: Diagnosis not present

## 2016-05-19 DIAGNOSIS — B9681 Helicobacter pylori [H. pylori] as the cause of diseases classified elsewhere: Secondary | ICD-10-CM | POA: Diagnosis present

## 2016-05-19 DIAGNOSIS — F411 Generalized anxiety disorder: Secondary | ICD-10-CM | POA: Diagnosis not present

## 2016-05-19 DIAGNOSIS — Z974 Presence of external hearing-aid: Secondary | ICD-10-CM

## 2016-05-19 DIAGNOSIS — Z8052 Family history of malignant neoplasm of bladder: Secondary | ICD-10-CM

## 2016-05-19 DIAGNOSIS — Z90710 Acquired absence of both cervix and uterus: Secondary | ICD-10-CM

## 2016-05-19 DIAGNOSIS — K2970 Gastritis, unspecified, without bleeding: Secondary | ICD-10-CM | POA: Diagnosis not present

## 2016-05-19 DIAGNOSIS — Z833 Family history of diabetes mellitus: Secondary | ICD-10-CM

## 2016-05-19 DIAGNOSIS — G8929 Other chronic pain: Secondary | ICD-10-CM | POA: Diagnosis present

## 2016-05-19 DIAGNOSIS — I2510 Atherosclerotic heart disease of native coronary artery without angina pectoris: Secondary | ICD-10-CM | POA: Diagnosis present

## 2016-05-19 DIAGNOSIS — R1084 Generalized abdominal pain: Secondary | ICD-10-CM

## 2016-05-19 DIAGNOSIS — Z9049 Acquired absence of other specified parts of digestive tract: Secondary | ICD-10-CM

## 2016-05-19 DIAGNOSIS — R1110 Vomiting, unspecified: Secondary | ICD-10-CM | POA: Diagnosis present

## 2016-05-19 DIAGNOSIS — I10 Essential (primary) hypertension: Secondary | ICD-10-CM | POA: Diagnosis not present

## 2016-05-19 DIAGNOSIS — A048 Other specified bacterial intestinal infections: Secondary | ICD-10-CM | POA: Diagnosis not present

## 2016-05-19 DIAGNOSIS — Z888 Allergy status to other drugs, medicaments and biological substances status: Secondary | ICD-10-CM

## 2016-05-19 DIAGNOSIS — Z8249 Family history of ischemic heart disease and other diseases of the circulatory system: Secondary | ICD-10-CM

## 2016-05-19 DIAGNOSIS — Z825 Family history of asthma and other chronic lower respiratory diseases: Secondary | ICD-10-CM

## 2016-05-19 DIAGNOSIS — Z79899 Other long term (current) drug therapy: Secondary | ICD-10-CM

## 2016-05-19 DIAGNOSIS — K449 Diaphragmatic hernia without obstruction or gangrene: Secondary | ICD-10-CM | POA: Diagnosis present

## 2016-05-19 LAB — COMPREHENSIVE METABOLIC PANEL
ALBUMIN: 4.1 g/dL (ref 3.5–5.0)
ALK PHOS: 105 U/L (ref 38–126)
ALT: 20 U/L (ref 14–54)
AST: 27 U/L (ref 15–41)
Anion gap: 8 (ref 5–15)
BILIRUBIN TOTAL: 0.6 mg/dL (ref 0.3–1.2)
BUN: 11 mg/dL (ref 6–20)
CO2: 25 mmol/L (ref 22–32)
Calcium: 9.4 mg/dL (ref 8.9–10.3)
Chloride: 107 mmol/L (ref 101–111)
Creatinine, Ser: 0.9 mg/dL (ref 0.44–1.00)
GFR calc Af Amer: >60 mL/min (ref >60)
GFR calc non Af Amer: >60 mL/min (ref >60)
GLUCOSE: 135 mg/dL — AB (ref 65–99)
POTASSIUM: 4.1 mmol/L (ref 3.5–5.1)
Sodium: 140 mmol/L (ref 135–145)
TOTAL PROTEIN: 7.8 g/dL (ref 6.5–8.1)

## 2016-05-19 LAB — URINALYSIS COMPLETE WITH MICROSCOPIC (ARMC ONLY)
BACTERIA UA: NONE SEEN
BILIRUBIN URINE: NEGATIVE
GLUCOSE, UA: NEGATIVE mg/dL
HGB URINE DIPSTICK: NEGATIVE
Ketones, ur: NEGATIVE mg/dL
Leukocytes, UA: NEGATIVE
Nitrite: NEGATIVE
Protein, ur: NEGATIVE mg/dL
Specific Gravity, Urine: 1.013 (ref 1.005–1.030)
pH: 5 (ref 5.0–8.0)

## 2016-05-19 LAB — CBC WITH DIFFERENTIAL/PLATELET
Basophils Absolute: 0.1 10*3/uL (ref 0–0.1)
Basophils Relative: 0 %
Eosinophils Absolute: 0 10*3/uL (ref 0–0.7)
Eosinophils Relative: 0 %
HEMATOCRIT: 35.9 % (ref 35.0–47.0)
HEMOGLOBIN: 12.1 g/dL (ref 12.0–16.0)
LYMPHS ABS: 0.8 10*3/uL — AB (ref 1.0–3.6)
LYMPHS PCT: 5 %
MCH: 29.3 pg (ref 26.0–34.0)
MCHC: 33.7 g/dL (ref 32.0–36.0)
MCV: 86.9 fL (ref 80.0–100.0)
MONOS PCT: 6 %
Monocytes Absolute: 1 10*3/uL — ABNORMAL HIGH (ref 0.2–0.9)
NEUTROS ABS: 14.6 10*3/uL — AB (ref 1.4–6.5)
NEUTROS PCT: 89 %
Platelets: 260 10*3/uL (ref 150–440)
RBC: 4.13 MIL/uL (ref 3.80–5.20)
RDW: 14.3 % (ref 11.5–14.5)
WBC: 16.5 10*3/uL — AB (ref 3.6–11.0)

## 2016-05-19 LAB — CBC
HEMATOCRIT: 30.1 % — AB (ref 35.0–47.0)
Hemoglobin: 10 g/dL — ABNORMAL LOW (ref 12.0–16.0)
MCH: 29.3 pg (ref 26.0–34.0)
MCHC: 33.2 g/dL (ref 32.0–36.0)
MCV: 88.2 fL (ref 80.0–100.0)
Platelets: 228 10*3/uL (ref 150–440)
RBC: 3.41 MIL/uL — ABNORMAL LOW (ref 3.80–5.20)
RDW: 14.2 % (ref 11.5–14.5)
WBC: 13.4 10*3/uL — ABNORMAL HIGH (ref 3.6–11.0)

## 2016-05-19 LAB — LIPASE, BLOOD: Lipase: 43 U/L (ref 11–51)

## 2016-05-19 MED ORDER — SODIUM CHLORIDE 0.9 % IV SOLN
Freq: Once | INTRAVENOUS | Status: AC
  Administered 2016-05-19: 18:00:00 via INTRAVENOUS

## 2016-05-19 MED ORDER — ONDANSETRON HCL 4 MG/2ML IJ SOLN
4.0000 mg | Freq: Once | INTRAMUSCULAR | Status: AC
  Administered 2016-05-19: 4 mg via INTRAVENOUS

## 2016-05-19 MED ORDER — IOPAMIDOL (ISOVUE-300) INJECTION 61%
30.0000 mL | Freq: Once | INTRAVENOUS | Status: AC | PRN
  Administered 2016-05-19: 30 mL via ORAL
  Filled 2016-05-19: qty 30

## 2016-05-19 MED ORDER — MORPHINE SULFATE (PF) 2 MG/ML IV SOLN
INTRAVENOUS | Status: AC
  Administered 2016-05-19: 4 mg via INTRAVENOUS
  Filled 2016-05-19: qty 2

## 2016-05-19 MED ORDER — METOCLOPRAMIDE HCL 5 MG/ML IJ SOLN
10.0000 mg | Freq: Once | INTRAMUSCULAR | Status: AC
  Administered 2016-05-19: 10 mg via INTRAVENOUS
  Filled 2016-05-19 (×2): qty 2

## 2016-05-19 MED ORDER — FAMOTIDINE IN NACL 20-0.9 MG/50ML-% IV SOLN
INTRAVENOUS | Status: AC
  Administered 2016-05-19: 20 mg via INTRAVENOUS
  Filled 2016-05-19: qty 50

## 2016-05-19 MED ORDER — ONDANSETRON HCL 4 MG/2ML IJ SOLN
INTRAMUSCULAR | Status: AC
  Administered 2016-05-19: 4 mg via INTRAVENOUS
  Filled 2016-05-19: qty 2

## 2016-05-19 MED ORDER — ACETAMINOPHEN 500 MG PO TABS
1000.0000 mg | ORAL_TABLET | Freq: Once | ORAL | Status: AC
  Administered 2016-05-19: 1000 mg via ORAL
  Filled 2016-05-19: qty 2

## 2016-05-19 MED ORDER — IOPAMIDOL (ISOVUE-300) INJECTION 61%
100.0000 mL | Freq: Once | INTRAVENOUS | Status: AC | PRN
  Administered 2016-05-19: 100 mL via INTRAVENOUS
  Filled 2016-05-19: qty 100

## 2016-05-19 MED ORDER — MORPHINE SULFATE (PF) 4 MG/ML IV SOLN
4.0000 mg | Freq: Once | INTRAVENOUS | Status: AC
Start: 2016-05-19 — End: 2016-05-19
  Administered 2016-05-19: 4 mg via INTRAVENOUS

## 2016-05-19 MED ORDER — FAMOTIDINE IN NACL 20-0.9 MG/50ML-% IV SOLN
20.0000 mg | Freq: Once | INTRAVENOUS | Status: AC
  Administered 2016-05-19: 20 mg via INTRAVENOUS

## 2016-05-19 NOTE — ED Notes (Signed)
Transporting patient to room 225-2C 

## 2016-05-19 NOTE — ED Notes (Signed)
Patient transported to CT 

## 2016-05-19 NOTE — ED Notes (Signed)
Patient transported to radiology

## 2016-05-19 NOTE — ED Notes (Signed)
Patient being evaluated for hernia surgery, Dr. Rae Roam.

## 2016-05-19 NOTE — ED Notes (Signed)
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

## 2016-05-19 NOTE — ED Triage Notes (Addendum)
Pt to triage via wheelchair.  Pt brought in by ems with iv in rac.  Pt reports a headache with psuedoseizures and abd pain with nausea.  Pt talking with eyes closed.  pt given iv zofran per ems .  Hx h. pyloria

## 2016-05-19 NOTE — ED Provider Notes (Signed)
[facility] Emergency Department Provider Note        Time seen: ----------------------------------------- 5:10 PM on 05/19/2016 -----------------------------------------    I have reviewed the triage vital signs and the nursing notes.   HISTORY  Chief Complaint Abdominal Pain and Headache    HPI Mary Bryant is a 50 y.o. female who presents the ER being brought in by EMS for headache with pseudoseizures and abdominal pain with nausea. Patient states she's been vomiting. 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 She reports a history of H. pylori and is currently on antibiotics for same. She had an endoscopy within the last 2 weeks. She states she feels very nauseous currently has had abdominal pain since vomiting.   Past Medical History:  Diagnosis Date  . Esophageal stricture    dilated 2009,  [place]  . GERD (gastroesophageal reflux disease)   . History of hiatal hernia   . Hypertension    no prior treatment  . Insomnia    chronic, no prior sleep study  . Left bundle branch block   . PONV (postoperative nausea and vomiting)   . rhinitis allergic   . Wears contact lenses   . Wears hearing aid    bilateral    Patient Active Problem List   Diagnosis Date Noted  . Musculoskeletal pain 05/17/2016  . Helicobacter pylori gastritis 05/12/2016  . Abdominal pain, epigastric   . Hiatal hernia   . Abdominal pain, right upper quadrant   . Encounter for preventive health examination 05/10/2015  . Menopausal hot flushes 05/10/2015  . S/P total hysterectomy 05/08/2015  . RLQ abdominal pain 04/04/2015  . 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 Long-term use of high-risk medication 04/21/2014  . Trigeminal neuralgia of left side of face 03/22/2014  . 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 Occasional tremors 01/07/2014  . Chronic headaches 11/21/2013  .  Anxiety 10/25/2013  . Hiatal hernia with GERD and esophagitis 10/25/2013  . H pylori ulcer 10/25/2013  . History of breast lump/mass excision 08/08/2013  . Vitamin D deficiency 06/10/2013  . Generalized anxiety disorder 06/08/2013  . Screening for breast cancer 06/08/2013  . CAD in native artery 06/08/2013  . H/O left bundle branch block 06/08/2013  . Chest pain 04/04/2013  . Overweight (BMI 25.0-29.9) 01/17/2012  . Insomnia   . Esophageal stricture     Past Surgical History:  Procedure Laterality Date  . ABDOMINAL HYSTERECTOMY  2000   no history of ca   . CHOLECYSTECTOMY  2008  . ESOPHAGOGASTRODUODENOSCOPY (EGD) WITH PROPOFOL N/A 05/12/2016   Procedure: ESOPHAGOGASTRODUODENOSCOPY (EGD) WITH PROPOFOL;  Surgeon: Jonathon Bellows, MD;  Location: [facility];  Service: Endoscopy;  Laterality: N/A;    Allergies Promethazine; Benadryl [diphenhydramine hcl]; and Promethazine hcl  Social History Social History  Substance Use Topics  . Smoking status: Never Smoker  . Smokeless tobacco: Never Used  . Alcohol use No    Review of Systems Constitutional: Negative for fever. Cardiovascular: Negative for chest pain. Respiratory: Negative for shortness of breath. Gastrointestinal: Positive for abdominal pain, vomiting Genitourinary: Negative for dysuria. Musculoskeletal: Negative for back pain. Skin: Negative for rash. Neurological: Negative for headaches, focal weakness or numbness.  10-point ROS otherwise negative.  ____________________________________________   PHYSICAL EXAM:  VITAL SIGNS: ED Triage Vitals  Enc Vitals Group     BP 05/19/16 1534 (!) 153/85     Pulse Rate 05/19/16  1534 (!) 120     Resp 05/19/16 1534 20     Temp 05/19/16 1534 98.6 F (37 C)     Temp Source 05/19/16 1534 Oral     SpO2 05/19/16 1534 100 %     Weight 05/19/16 1534 198 lb (89.8 kg)     Height 05/19/16 1534 5\' 6"  (1.676 m)     Head Circumference --      Peak Flow --      Pain Score  05/19/16 1535 10     Pain Loc --      Pain Edu? --      Excl. in [place]? --     Constitutional: Alert and oriented. Anxious, no acute distress Eyes: Conjunctivae are normal. PERRL. Normal extraocular movements. ENT   Head: Normocephalic and atraumatic.   Nose: No congestion/rhinnorhea.   Mouth/Throat: Mucous membranes are moist.   Neck: No stridor. Cardiovascular: Normal rate, regular rhythm. No murmurs, rubs, or gallops. Respiratory: Normal respiratory effort without tachypnea nor retractions. Breath sounds are clear and equal bilaterally. No wheezes/rales/rhonchi. Gastrointestinal: Soft and nontender. Normal bowel sounds Rectal: Heme-negative stool, no hemorrhoids Musculoskeletal: Nontender with normal range of motion in all extremities. No lower extremity tenderness nor edema. Neurologic:  Normal speech and language. No gross focal neurologic deficits are appreciated.  Skin:  Skin is warm, dry and intact. No rash noted. Psychiatric: Depressed mood at times ____________________________________________  ED COURSE:  Pertinent labs & imaging results that were available during my care of the patient were reviewed by me and considered in my medical decision making (see chart for details). Clinical Course  Value Comment By Time   Family of the patient arrived and were very upset that she was not placed in a treatment room immediately on arrival. Family is concerned she had an unstable neurologic condition. Earleen Newport, MD 11/01 1822  Specific Gravity, Urine: 1.013 (Reviewed) Earleen Newport, MD 11/01 [phone]  Patient presents to ER with abdominal pain and vomiting. Recently assessed with H. pylori and a hiatal hernia. We will assess with labs and imaging. She'll receive IV emetics  Procedures ____________________________________________   LABS (pertinent positives/negatives)  Labs Reviewed  COMPREHENSIVE METABOLIC PANEL - Abnormal; Notable for the following:        Result Value   Glucose, Bld 135 (*)    All other components within normal limits  CBC - Abnormal; Notable for the following:    WBC 13.4 (*)    RBC 3.41 (*)    Hemoglobin 10.0 (*)    HCT 30.1 (*)    All other components within normal limits  URINALYSIS COMPLETEWITH MICROSCOPIC ([facility] ONLY) - Abnormal; Notable for the following:    Color, Urine STRAW (*)    APPearance CLEAR (*)    Squamous Epithelial / LPF 0-5 (*)    All other components within normal limits  CBC WITH DIFFERENTIAL/PLATELET - Abnormal; Notable for the following:    WBC 16.5 (*)    Neutro Abs 14.6 (*)    Lymphs Abs 0.8 (*)    Monocytes Absolute 1.0 (*)    All other components within normal limits  LIPASE, BLOOD    RADIOLOGY Images were viewed by me  Abdomen 2 view IMPRESSION: Stool in the majority of the colon is indicative of constipation. IMPRESSION: 1. No acute abnormality. No evidence of bowel obstruction or acute bowel inflammation. Normal appendix. 2. Stable small to moderate hiatal hernia.  ____________________________________________  FINAL ASSESSMENT AND PLAN  Abdominal pain, vomiting, recent  H. pylori diagnosis  Plan: Patient with labs and imaging as dictated above. Patient with intractable pain, nausea despite IV medication here. I will add IV Pepcid. Family and patient are requesting admission to the hospital. I will discuss with the hospitalist for admission.   Earleen Newport, MD   Note: This dictation was prepared with Dragon dictation. Any transcriptional errors that result from this process are unintentional    Earleen Newport, MD 05/19/16 2126

## 2016-05-19 NOTE — ED Notes (Signed)
Pt unable to void at this time. 

## 2016-05-19 NOTE — Telephone Encounter (Signed)
Referral and form was filled out and faxed to [facility].  [facility] stated that they would call the patient to schedule the appointment.

## 2016-05-19 NOTE — H&P (Signed)
[facility] [place] NAME: Mary Bryant    MR#:  [id number]  DATE OF BIRTH:  [birth year]  DATE OF ADMISSION:  05/19/2016  PRIMARY CARE PHYSICIAN: Crecencio Mc, MD   REQUESTING/REFERRING PHYSICIAN: Jimmye Norman, MD  CHIEF COMPLAINT:   Chief Complaint  Patient presents with  . Abdominal Pain  . Headache    HISTORY OF PRESENT ILLNESS:  Mary Bryant  is a 50 y.o. female who presents with Abdominal pain with vomiting. Patient is in the middle of GI workup for hiatal hernia, was recently diagnosed with H. pylori and is on treatment for the same. She states that today she began having abdominal pain with some vomiting. This all that she had a pseudoseizure. She states that she has had these before. During the episode she called her mother, who subsequently called EMS and had her brought to the hospital. Here she had difficulty controlling her nausea and abdominal pain, so hospitals were called for admission and further treatment  PAST MEDICAL HISTORY:   Past Medical History:  Diagnosis Date  . Esophageal stricture    dilated 2009,  [place]  . GERD (gastroesophageal reflux disease)   . History of hiatal hernia   . Hypertension    no prior treatment  . Insomnia    chronic, no prior sleep study  . Left bundle branch block   . PONV (postoperative nausea and vomiting)   . rhinitis allergic   . Wears contact lenses   . Wears hearing aid    bilateral    PAST SURGICAL HISTORY:   Past Surgical History:  Procedure Laterality Date  . ABDOMINAL HYSTERECTOMY  2000   no history of ca   . CHOLECYSTECTOMY  2008  . ESOPHAGOGASTRODUODENOSCOPY (EGD) WITH PROPOFOL N/A 05/12/2016   Procedure: ESOPHAGOGASTRODUODENOSCOPY (EGD) WITH PROPOFOL;  Surgeon: Jonathon Bellows, MD;  Location: [facility];  Service: Endoscopy;  Laterality: N/A;    SOCIAL HISTORY:   Social History  Substance Use Topics  . Smoking status: Never Smoker  .  Smokeless tobacco: Never Used  . Alcohol use No    FAMILY HISTORY:   Family History  Problem Relation Age of Onset  . Cancer Father     Bladder,  in Hospice  . Diabetes type II Father   . COPD Father   . Coronary artery disease Father   . Heart disease Father 17  . Diabetes Mother   . Hyperlipidemia Mother   . Hypertension Mother   . Early death Paternal Grandmother   . Heart disease Paternal Grandmother     CAD    DRUG ALLERGIES:   Allergies  Allergen Reactions  . Promethazine Anaphylaxis  . Benadryl [Diphenhydramine Hcl] Hives  . Promethazine Hcl Hives    MEDICATIONS AT HOME:   Prior to Admission medications   Medication Sig Start Date End Date Taking? Authorizing Provider  acetaminophen (TYLENOL) 500 MG tablet Take 1,000 mg by mouth every 6 (six) hours as needed for mild pain.   Yes Historical Provider, MD  acyclovir (ZOVIRAX) 400 MG tablet Take 400 mg by mouth daily as needed.   Yes Historical Provider, MD  acyclovir ointment (ZOVIRAX) 5 % Apply 1 application topically every 3 (three) hours.   Yes Historical Provider, MD  ALPRAZolam Duanne Moron) 0.5 MG tablet TAKE 1 TABLET BY MOUTH ONCE DAILY 03/08/16  Yes Crecencio Mc, MD  amoxicillin (AMOXIL) 500 MG capsule Take 2 capsules (1,000 mg total) by mouth 2 (two) times  daily. 05/12/16  Yes Crecencio Mc, MD  calcium carbonate (TUMS) 500 MG chewable tablet Chew 1 tablet (200 mg of elemental calcium total) by mouth 3 (three) times daily with meals. 05/07/16 06/06/16 Yes Rudene Re, MD  carbamazepine (TEGRETOL) 200 MG tablet TAKE 1 TABLET (200 MG TOTAL) BY MOUTH 3 (THREE) TIMES DAILY. 10/20/15  Yes Crecencio Mc, MD  clarithromycin (BIAXIN) 250 MG tablet Take 2 tablets (500 mg total) by mouth 2 (two) times daily. 05/12/16  Yes Crecencio Mc, MD  cyanocobalamin 1000 MCG tablet Take 100 mcg by mouth daily.   Yes Historical Provider, MD  ergocalciferol (VITAMIN D2) 50000 units capsule Take 1 capsule (50,000 Units total) by  mouth once a week. 05/12/16  Yes Crecencio Mc, MD  escitalopram (LEXAPRO) 10 MG tablet TAKE 1 TABLET BY MOUTH ONCE DAILY 03/08/16  Yes Crecencio Mc, MD  HYDROcodone-acetaminophen (NORCO/VICODIN) 5-325 MG per tablet Take 1 tablet by mouth every 6 (six) hours as needed for moderate pain. 04/04/15  Yes Coral Spikes, DO  metoprolol succinate (TOPROL-XL) 25 MG 24 hr tablet TAKE 1 TABLET BY MOUTH ONCE DAILY 04/19/16  Yes Crecencio Mc, MD  pantoprazole (PROTONIX) 20 MG tablet Take 1 tablet (20 mg total) by mouth daily. 05/07/16 05/07/17 Yes Rudene Re, MD    REVIEW OF SYSTEMS:  Review of Systems  Constitutional: Negative for chills, fever, malaise/fatigue and weight loss.  HENT: Negative for ear pain, hearing loss and tinnitus.   Eyes: Negative for blurred vision, double vision, pain and redness.  Respiratory: Negative for cough, hemoptysis and shortness of breath.   Cardiovascular: Negative for chest pain, palpitations, orthopnea and leg swelling.  Gastrointestinal: Positive for abdominal pain, nausea and vomiting. Negative for constipation and diarrhea.  Genitourinary: Negative for dysuria, frequency and hematuria.  Musculoskeletal: Negative for back pain, joint pain and neck pain.  Skin:       No acne, rash, or lesions  Neurological: Negative for dizziness, tremors, focal weakness and weakness.  Endo/Heme/Allergies: Negative for polydipsia. Does not bruise/bleed easily.  Psychiatric/Behavioral: Negative for depression. The patient is not nervous/anxious and does not have insomnia.      VITAL SIGNS:   Vitals:   05/19/16 1534 05/19/16 1941 05/19/16 2131 05/19/16 2211  BP: (!) 153/85 116/75  (!) 93/59  Pulse: (!) 120 96  74  Resp: 20 16  14   Temp: 98.6 F (37 C) 99.1 F (37.3 C) 98.2 F (36.8 C) 98 F (36.7 C)  TempSrc: Oral Oral Oral Oral  SpO2: 100% 99%  97%  Weight: 89.8 kg (198 lb)     Height: 5\' 6"  (1.676 m)      Wt Readings from Last 3 Encounters:  05/19/16 89.8 kg  (198 lb)  05/18/16 90.7 kg (200 lb)  05/17/16 89.8 kg (198 lb)    PHYSICAL EXAMINATION:  Physical Exam  Vitals reviewed. Constitutional: She is oriented to person, place, and time. She appears well-developed and well-nourished. No distress.  HENT:  Head: Normocephalic and atraumatic.  Mouth/Throat: Oropharynx is clear and moist.  Eyes: Conjunctivae and EOM are normal. Pupils are equal, round, and reactive to light. No scleral icterus.  Neck: Normal range of motion. Neck supple. No JVD present. No thyromegaly present.  Cardiovascular: Normal rate, regular rhythm and intact distal pulses.  Exam reveals no gallop and no friction rub.   No murmur heard. Respiratory: Effort normal and breath sounds normal. No respiratory distress. She has no wheezes. She has no rales.  GI: Soft. Bowel sounds are normal. She exhibits no distension. There is tenderness.  Musculoskeletal: Normal range of motion. She exhibits no edema.  No arthritis, no gout  Lymphadenopathy:    She has no cervical adenopathy.  Neurological: She is alert and oriented to person, place, and time. No cranial nerve deficit.  No dysarthria, no aphasia  Skin: Skin is warm and dry. No rash noted. No erythema.  Psychiatric: She has a normal mood and affect. Her behavior is normal. Judgment and thought content normal.    LABORATORY PANEL:   CBC  Recent Labs Lab 05/19/16 1841  WBC 16.5*  HGB 12.1  HCT 35.9  PLT 260   ------------------------------------------------------------------------------------------------------------------  Chemistries   Recent Labs Lab 05/19/16 1535  NA 140  K 4.1  CL 107  CO2 25  GLUCOSE 135*  BUN 11  CREATININE 0.90  CALCIUM 9.4  AST 27  ALT 20  ALKPHOS 105  BILITOT 0.6   ------------------------------------------------------------------------------------------------------------------  Cardiac Enzymes No results for input(s): TROPONINI in the last 168  hours. ------------------------------------------------------------------------------------------------------------------  RADIOLOGY:  Ct Abdomen Pelvis W Contrast  Result Date: 05/19/2016 CLINICAL DATA:  Severe generalized abdominal pain, nausea and vomiting. Prior cholecystectomy and hysterectomy. EXAM: CT ABDOMEN AND PELVIS WITH CONTRAST TECHNIQUE: Multidetector CT imaging of the abdomen and pelvis was performed using the standard protocol following bolus administration of intravenous contrast. CONTRAST:  131mL ISOVUE-300 IOPAMIDOL (ISOVUE-300) INJECTION 61% COMPARISON:  02/26/2016 CT abdomen/ pelvis. Abdominal radiographs from earlier today. FINDINGS: Lower chest: No significant pulmonary nodules or acute consolidative airspace disease. Hepatobiliary: Normal liver with no liver mass. Cholecystectomy No biliary ductal dilatation. Pancreas: Normal, with no mass or duct dilation. Spleen: Normal size. No mass. Adrenals/Urinary Tract: Normal adrenals. No hydronephrosis. No renal masses. Normal bladder. Stomach/Bowel: Small to moderate hiatal hernia, stable. Otherwise relatively collapsed and grossly normal stomach. Normal caliber small bowel with no small bowel wall thickening. Normal appendix. Normal large bowel with no diverticulosis, large bowel wall thickening or pericolonic fat stranding. Vascular/Lymphatic: Normal caliber abdominal aorta. Patent portal, splenic, hepatic and renal veins. No pathologically enlarged lymph nodes in the abdomen or pelvis. Reproductive: Status post hysterectomy, with no abnormal findings at the vaginal cuff. No adnexal mass. Other: No pneumoperitoneum, ascites or focal fluid collection. Musculoskeletal: No aggressive appearing focal osseous lesions. Subcentimeter sclerotic foci in the left acetabulum are stable back to the 12/25/2005 CT, consistent with benign bone islands. Moderate degenerative changes at L5-S1. IMPRESSION: 1. No acute abnormality. No evidence of bowel  obstruction or acute bowel inflammation. Normal appendix. 2. Stable small to moderate hiatal hernia. Electronically Signed   By: Ilona Sorrel M.D.   On: 05/19/2016 21:16   Dg Abd 2 Views  Result Date: 05/19/2016 CLINICAL DATA:  Nausea and vomiting. EXAM: ABDOMEN - 2 VIEW COMPARISON:  CT abdomen pelvis 02/26/2016. FINDINGS: Stool is seen throughout the colon. No small bowel dilatation. Surgical clips in the right upper quadrant. Lung bases are clear. IMPRESSION: Stool in the majority of the colon is indicative of constipation. Electronically Signed   By: Lorin Picket M.D.   On: 05/19/2016 18:10    EKG:   Orders placed or performed during the hospital encounter of 05/07/16  . ED EKG  . ED EKG  . EKG 12-Lead  . EKG 12-Lead    IMPRESSION AND PLAN:  Principal Problem:   Abdominal pain with vomiting - when necessary analgesics and antiemetics. IV fluids Active Problems:   Helicobacter pylori gastritis - continue treatment per home regimen  Generalized anxiety disorder - continue home meds   CAD in native artery - continue home meds  All the records are reviewed and case discussed with ED provider. Management plans discussed with the patient and/or family.  DVT PROPHYLAXIS: SubQ lovenox  GI PROPHYLAXIS: PPI  ADMISSION STATUS: Observation  CODE STATUS: Full Code Status History    This patient does not have a recorded code status. Please follow your organizational policy for patients in this situation.      TOTAL TIME TAKING CARE OF THIS PATIENT: 40 minutes.    Demi Trieu FIELDING 05/19/2016, 11:04 PM  [facility]  Office  [phone]  CC: Primary care physician; Crecencio Mc, MD

## 2016-05-19 NOTE — Telephone Encounter (Signed)
Mary Bryant's mother called at this time to let us know that EMS is at her home due to Mary Bryant having a seizure and that she would be transferred to [facility].  She wanted to know if the referral had been sent to [facility] for the Esophageal manometry and was told yes.   I let [name] and Dr.Anna know that she had called in regarding her daughter.

## 2016-05-20 ENCOUNTER — Telehealth: Payer: Self-pay | Admitting: Internal Medicine

## 2016-05-20 DIAGNOSIS — Z825 Family history of asthma and other chronic lower respiratory diseases: Secondary | ICD-10-CM | POA: Diagnosis not present

## 2016-05-20 DIAGNOSIS — Z888 Allergy status to other drugs, medicaments and biological substances status: Secondary | ICD-10-CM | POA: Diagnosis not present

## 2016-05-20 DIAGNOSIS — K219 Gastro-esophageal reflux disease without esophagitis: Secondary | ICD-10-CM | POA: Diagnosis present

## 2016-05-20 DIAGNOSIS — Z90710 Acquired absence of both cervix and uterus: Secondary | ICD-10-CM | POA: Diagnosis not present

## 2016-05-20 DIAGNOSIS — G4700 Insomnia, unspecified: Secondary | ICD-10-CM | POA: Diagnosis present

## 2016-05-20 DIAGNOSIS — Z8249 Family history of ischemic heart disease and other diseases of the circulatory system: Secondary | ICD-10-CM | POA: Diagnosis not present

## 2016-05-20 DIAGNOSIS — R1110 Vomiting, unspecified: Secondary | ICD-10-CM | POA: Diagnosis present

## 2016-05-20 DIAGNOSIS — F411 Generalized anxiety disorder: Secondary | ICD-10-CM | POA: Diagnosis not present

## 2016-05-20 DIAGNOSIS — Z8052 Family history of malignant neoplasm of bladder: Secondary | ICD-10-CM | POA: Diagnosis not present

## 2016-05-20 DIAGNOSIS — I2510 Atherosclerotic heart disease of native coronary artery without angina pectoris: Secondary | ICD-10-CM | POA: Diagnosis not present

## 2016-05-20 DIAGNOSIS — R1084 Generalized abdominal pain: Secondary | ICD-10-CM | POA: Diagnosis present

## 2016-05-20 DIAGNOSIS — B9681 Helicobacter pylori [H. pylori] as the cause of diseases classified elsewhere: Secondary | ICD-10-CM | POA: Diagnosis present

## 2016-05-20 DIAGNOSIS — A048 Other specified bacterial intestinal infections: Secondary | ICD-10-CM | POA: Diagnosis not present

## 2016-05-20 DIAGNOSIS — G8929 Other chronic pain: Secondary | ICD-10-CM | POA: Diagnosis present

## 2016-05-20 DIAGNOSIS — Z833 Family history of diabetes mellitus: Secondary | ICD-10-CM | POA: Diagnosis not present

## 2016-05-20 DIAGNOSIS — K2970 Gastritis, unspecified, without bleeding: Secondary | ICD-10-CM | POA: Diagnosis present

## 2016-05-20 DIAGNOSIS — Z974 Presence of external hearing-aid: Secondary | ICD-10-CM | POA: Diagnosis not present

## 2016-05-20 DIAGNOSIS — Z9049 Acquired absence of other specified parts of digestive tract: Secondary | ICD-10-CM | POA: Diagnosis not present

## 2016-05-20 DIAGNOSIS — R109 Unspecified abdominal pain: Secondary | ICD-10-CM | POA: Diagnosis not present

## 2016-05-20 DIAGNOSIS — Z79899 Other long term (current) drug therapy: Secondary | ICD-10-CM | POA: Diagnosis not present

## 2016-05-20 DIAGNOSIS — I10 Essential (primary) hypertension: Secondary | ICD-10-CM | POA: Diagnosis present

## 2016-05-20 DIAGNOSIS — K449 Diaphragmatic hernia without obstruction or gangrene: Secondary | ICD-10-CM | POA: Diagnosis present

## 2016-05-20 LAB — BASIC METABOLIC PANEL
Anion gap: 8 (ref 5–15)
BUN: 9 mg/dL (ref 6–20)
CALCIUM: 8.5 mg/dL — AB (ref 8.9–10.3)
CO2: 26 mmol/L (ref 22–32)
CREATININE: 0.74 mg/dL (ref 0.44–1.00)
Chloride: 107 mmol/L (ref 101–111)
Glucose, Bld: 95 mg/dL (ref 65–99)
Potassium: 3.9 mmol/L (ref 3.5–5.1)
SODIUM: 141 mmol/L (ref 135–145)

## 2016-05-20 LAB — CBC
HCT: 36.2 % (ref 35.0–47.0)
Hemoglobin: 12.1 g/dL (ref 12.0–16.0)
MCH: 29.4 pg (ref 26.0–34.0)
MCHC: 33.5 g/dL (ref 32.0–36.0)
MCV: 87.8 fL (ref 80.0–100.0)
PLATELETS: 258 10*3/uL (ref 150–440)
RBC: 4.13 MIL/uL (ref 3.80–5.20)
RDW: 14.5 % (ref 11.5–14.5)
WBC: 11 10*3/uL (ref 3.6–11.0)

## 2016-05-20 LAB — H. PYLORI ANTIGEN, STOOL: H. PYLORI STOOL AG, EIA: NEGATIVE

## 2016-05-20 MED ORDER — METOPROLOL SUCCINATE ER 50 MG PO TB24
25.0000 mg | ORAL_TABLET | Freq: Every day | ORAL | Status: DC
  Administered 2016-05-20 – 2016-05-21 (×2): 25 mg via ORAL
  Filled 2016-05-20 (×2): qty 1

## 2016-05-20 MED ORDER — SODIUM CHLORIDE 0.9 % IV SOLN
INTRAVENOUS | Status: DC
  Administered 2016-05-20 (×2): via INTRAVENOUS

## 2016-05-20 MED ORDER — ACETAMINOPHEN 325 MG PO TABS: 650.0000 mg | ORAL_TABLET | Freq: Four times a day (QID) | ORAL | Status: DC | PRN

## 2016-05-20 MED ORDER — ONDANSETRON HCL 4 MG PO TABS: 4.0000 mg | ORAL_TABLET | Freq: Four times a day (QID) | ORAL | Status: DC | PRN

## 2016-05-20 MED ORDER — CLARITHROMYCIN 500 MG PO TABS
500.0000 mg | ORAL_TABLET | Freq: Two times a day (BID) | ORAL | Status: DC
  Administered 2016-05-20 – 2016-05-21 (×4): 500 mg via ORAL
  Filled 2016-05-20 (×4): qty 1

## 2016-05-20 MED ORDER — PANTOPRAZOLE SODIUM 40 MG PO TBEC
40.0000 mg | DELAYED_RELEASE_TABLET | Freq: Every day | ORAL | Status: DC
  Administered 2016-05-20 – 2016-05-21 (×2): 40 mg via ORAL
  Filled 2016-05-20 (×2): qty 1

## 2016-05-20 MED ORDER — ONDANSETRON HCL 4 MG/2ML IJ SOLN
4.0000 mg | Freq: Four times a day (QID) | INTRAMUSCULAR | Status: DC | PRN
  Administered 2016-05-20 – 2016-05-21 (×2): 4 mg via INTRAVENOUS
  Filled 2016-05-20 (×2): qty 2

## 2016-05-20 MED ORDER — ALPRAZOLAM 0.5 MG PO TABS
0.5000 mg | ORAL_TABLET | Freq: Every day | ORAL | Status: DC
  Administered 2016-05-20 – 2016-05-21 (×2): 0.5 mg via ORAL
  Filled 2016-05-20 (×2): qty 1

## 2016-05-20 MED ORDER — AMOXICILLIN 500 MG PO CAPS
1000.0000 mg | ORAL_CAPSULE | Freq: Two times a day (BID) | ORAL | Status: DC
  Administered 2016-05-20 – 2016-05-21 (×4): 1000 mg via ORAL
  Filled 2016-05-20 (×4): qty 2

## 2016-05-20 MED ORDER — SODIUM CHLORIDE 0.9 % IV SOLN
INTRAVENOUS | Status: DC
  Administered 2016-05-20: 01:00:00 via INTRAVENOUS

## 2016-05-20 MED ORDER — CARBAMAZEPINE 200 MG PO TABS
200.0000 mg | ORAL_TABLET | Freq: Three times a day (TID) | ORAL | Status: DC
  Administered 2016-05-20 – 2016-05-21 (×4): 200 mg via ORAL
  Filled 2016-05-20 (×5): qty 1

## 2016-05-20 MED ORDER — OXYCODONE HCL 5 MG PO TABS
5.0000 mg | ORAL_TABLET | ORAL | Status: DC | PRN
  Administered 2016-05-20 (×2): 5 mg via ORAL
  Filled 2016-05-20 (×2): qty 1

## 2016-05-20 MED ORDER — PANTOPRAZOLE SODIUM 20 MG PO TBEC: 20.0000 mg | DELAYED_RELEASE_TABLET | Freq: Every day | ORAL | Status: DC

## 2016-05-20 MED ORDER — ENOXAPARIN SODIUM 40 MG/0.4ML ~~LOC~~ SOLN
40.0000 mg | SUBCUTANEOUS | Status: DC
  Administered 2016-05-20: 40 mg via SUBCUTANEOUS
  Filled 2016-05-20: qty 0.4

## 2016-05-20 MED ORDER — ESCITALOPRAM OXALATE 10 MG PO TABS
10.0000 mg | ORAL_TABLET | Freq: Every day | ORAL | Status: DC
  Administered 2016-05-20 – 2016-05-21 (×2): 10 mg via ORAL
  Filled 2016-05-20 (×2): qty 1

## 2016-05-20 MED ORDER — HYDROCODONE-ACETAMINOPHEN 5-325 MG PO TABS
1.0000 | ORAL_TABLET | Freq: Four times a day (QID) | ORAL | Status: DC | PRN
  Administered 2016-05-20 – 2016-05-21 (×2): 1 via ORAL
  Filled 2016-05-20 (×2): qty 1

## 2016-05-20 MED ORDER — ACETAMINOPHEN 650 MG RE SUPP: 650.0000 mg | Freq: Four times a day (QID) | RECTAL | Status: DC | PRN

## 2016-05-20 NOTE — Telephone Encounter (Signed)
Pt Mom Vaughan Basta called and needs a letter saying she is on short disability and needs to start yesterday because she was hospitalized. Her hernia surgery will be pushed out for at least 2 weeks because of hospitalization.  Mom needs to have this note today because if pt does not have the note she will have to work 3rd shift tonight. Please advise, thank you!  Call [facility] - [phone]

## 2016-05-20 NOTE — Telephone Encounter (Signed)
I simply cannot accomodate her mother's request based on her exam several weeks ago.  I will NOT PUT HER ON SHORT TERM DISABILITY FOR A HIATAL HERNIA.

## 2016-05-20 NOTE — Plan of Care (Signed)
Problem: Pain Managment: Goal: General experience of comfort will improve Outcome: Progressing Pain med effective at this time.  Problem: Fluid Volume: Goal: Ability to maintain a balanced intake and output will improve Outcome: Progressing No nausea/vomitting at this time.

## 2016-05-20 NOTE — Telephone Encounter (Signed)
Mother insisting patient will ose her job if records do not state she needs to be on short term disability until after her hernia surgery and starting 05/19/16 she will lose her job, tried to explain the hernia repair should be decided by surgeon for disability, Patient mother stated patient is to weak to work from [place] and medication.

## 2016-05-20 NOTE — Progress Notes (Signed)
[place] at [place] NAME: Mary Bryant    MR#:  [id number]  DATE OF BIRTH:  02-20-1966  SUBJECTIVE:  Patient came in after having nausea vomiting and some abdominal pain yesterday. No vomiting since yesterday evening. Tolerating regular diet. Having some nausea feeling due to her H. pylori antibiotics  REVIEW OF SYSTEMS:   Review of Systems  Constitutional: Negative for chills, fever and weight loss.  HENT: Negative for ear discharge, ear pain and nosebleeds.   Eyes: Negative for blurred vision, pain and discharge.  Respiratory: Negative for sputum production, shortness of breath, wheezing and stridor.   Cardiovascular: Negative for chest pain, palpitations, orthopnea and PND.  Gastrointestinal: Positive for nausea. Negative for abdominal pain, diarrhea and vomiting.  Genitourinary: Negative for frequency and urgency.  Musculoskeletal: Negative for back pain and joint pain.  Neurological: Positive for weakness. Negative for sensory change, speech change and focal weakness.  Psychiatric/Behavioral: Negative for depression and hallucinations. The patient is not nervous/anxious.    Tolerating Diet: Yes Tolerating PT: Ambulatory  DRUG ALLERGIES:   Allergies  Allergen Reactions  . Promethazine Anaphylaxis  . Benadryl [Diphenhydramine Hcl] Hives  . Promethazine Hcl Hives    VITALS:  Blood pressure 106/62, pulse 65, temperature 97.6 F (36.4 C), temperature source Oral, resp. rate 18, height 5\' 6"  (1.676 m), weight 90.3 kg (199 lb), SpO2 96 %.  PHYSICAL EXAMINATION:   Physical Exam  GENERAL:  50 y.o.-year-old patient lying in the bed with no acute distress.  EYES: Pupils equal, round, reactive to light and accommodation. No scleral icterus. Extraocular muscles intact.  HEENT: Head atraumatic, normocephalic. Oropharynx and nasopharynx clear.  NECK:  Supple, no jugular venous distention. No thyroid enlargement, no tenderness.   LUNGS: Normal breath sounds bilaterally, no wheezing, rales, rhonchi. No use of accessory muscles of respiration.  CARDIOVASCULAR: S1, S2 normal. No murmurs, rubs, or gallops.  ABDOMEN: Soft, nontender, nondistended. Bowel sounds present. No organomegaly or mass.  EXTREMITIES: No cyanosis, clubbing or edema b/l.    NEUROLOGIC: Cranial nerves II through XII are intact. No focal Motor or sensory deficits b/l.   PSYCHIATRIC:  patient is alert and oriented x 3.  SKIN: No obvious rash, lesion, or ulcer.   LABORATORY PANEL:  CBC  Recent Labs Lab 05/20/16 0430  WBC 11.0  HGB 12.1  HCT 36.2  PLT 258    Chemistries   Recent Labs Lab 05/19/16 1535 05/20/16 0430  NA 140 141  K 4.1 3.9  CL 107 107  CO2 25 26  GLUCOSE 135* 95  BUN 11 9  CREATININE 0.90 0.74  CALCIUM 9.4 8.5*  AST 27  --   ALT 20  --   ALKPHOS 105  --   BILITOT 0.6  --    Cardiac Enzymes No results for input(s): TROPONINI in the last 168 hours. RADIOLOGY:  Ct Abdomen Pelvis W Contrast  Result Date: 05/19/2016 CLINICAL DATA:  Severe generalized abdominal pain, nausea and vomiting. Prior cholecystectomy and hysterectomy. EXAM: CT ABDOMEN AND PELVIS WITH CONTRAST TECHNIQUE: Multidetector CT imaging of the abdomen and pelvis was performed using the standard protocol following bolus administration of intravenous contrast. CONTRAST:  113mL ISOVUE-300 IOPAMIDOL (ISOVUE-300) INJECTION 61% COMPARISON:  02/26/2016 CT abdomen/ pelvis. Abdominal radiographs from earlier today. FINDINGS: Lower chest: No significant pulmonary nodules or acute consolidative airspace disease. Hepatobiliary: Normal liver with no liver mass. Cholecystectomy No biliary ductal dilatation. Pancreas: Normal, with no mass or duct dilation. Spleen: Normal size.  No mass. Adrenals/Urinary Tract: Normal adrenals. No hydronephrosis. No renal masses. Normal bladder. Stomach/Bowel: Small to moderate hiatal hernia, stable. Otherwise relatively collapsed and grossly  normal stomach. Normal caliber small bowel with no small bowel wall thickening. Normal appendix. Normal large bowel with no diverticulosis, large bowel wall thickening or pericolonic fat stranding. Vascular/Lymphatic: Normal caliber abdominal aorta. Patent portal, splenic, hepatic and renal veins. No pathologically enlarged lymph nodes in the abdomen or pelvis. Reproductive: Status post hysterectomy, with no abnormal findings at the vaginal cuff. No adnexal mass. Other: No pneumoperitoneum, ascites or focal fluid collection. Musculoskeletal: No aggressive appearing focal osseous lesions. Subcentimeter sclerotic foci in the left acetabulum are stable back to the 12/25/2005 CT, consistent with benign bone islands. Moderate degenerative changes at L5-S1. IMPRESSION: 1. No acute abnormality. No evidence of bowel obstruction or acute bowel inflammation. Normal appendix. 2. Stable small to moderate hiatal hernia. Electronically Signed   By: Ilona Sorrel M.D.   On: 05/19/2016 21:16   Dg Abd 2 Views  Result Date: 05/19/2016 CLINICAL DATA:  Nausea and vomiting. EXAM: ABDOMEN - 2 VIEW COMPARISON:  CT abdomen pelvis 02/26/2016. FINDINGS: Stool is seen throughout the colon. No small bowel dilatation. Surgical clips in the right upper quadrant. Lung bases are clear. IMPRESSION: Stool in the majority of the colon is indicative of constipation. Electronically Signed   By: Lorin Picket M.D.   On: 05/19/2016 18:10   ASSESSMENT AND PLAN:  Mary Bryant  is a 50 y.o. female who presents with Abdominal pain with vomiting. Patient is in the middle of GI workup for hiatal hernia, was recently diagnosed with H. pylori and is on treatment for the same. She states that today she began having abdominal pain with some vomiting. Here she had difficulty controlling her nausea and abdominal pain  1. acute on chronic Abdominal pain with vomiting - when necessary analgesics and antiemetics. IV fluids -Resolved -Patient is being  worked up as outpatient by surgery Dr. Adonis Huguenin for hiatal hernia. Patient is awaiting further workup from the motility clinic at [facility] before her hiatal hernia surgeries Scheduled -Patient and family requesting surgery consult. Explained to family there is no indication for surgical consultation at present since she is improving and we'll consider only if needed.  2.   Helicobacter pylori gastritis - continue treatment per home regimen  3.   Generalized anxiety disorder - continue home meds  4   CAD in native artery - continue home meds  Patient's mother is requesting short-term disability/medical leave of absence for patient and our doctors note indicating she will not be able to go to work.  -Explain patient's mother I will give her a work note for the days she has been in the hospital and she needs to follow-up with primary care physician/surgery regarding her future needs for leave of absence from work.  Case discussed with Care Management/Social Worker. Management plans discussed with the patient, family and they are in agreement.   CODE STATUS: Full  DVT Prophylaxis: lovenox  TOTAL TIME TAKING CARE OF THIS PATIENT: 30 minutes.  >50% time spent on counselling and coordination of care pt,family  POSSIBLE D/C IN 1-2 DAYS, DEPENDING ON CLINICAL CONDITION.  Note: This dictation was prepared with Dragon dictation along with smaller phrase technology. Any transcriptional errors that result from this process are unintentional.  Bryndle Corredor M.D on 05/20/2016 at 1:32 PM  Between 7am to 6pm - Pager - [phone]  After 6pm go to [email or web address] - [place]  [facility]  Office  [phone]  CC: Primary care physician; Crecencio Mc, MD

## 2016-05-20 NOTE — Telephone Encounter (Signed)
Patient was admitted for Psuedo- seizure and nausea , vomiting. Patient tmother ask for note from MD for patient to be out of work tonight. Mother wants it to state patient is on short term disability starting 05/19/16.  NO disability on file in chart.

## 2016-05-20 NOTE — Telephone Encounter (Signed)
The patient needs to ask  the hospitalist  For the work note for the hospitalization .

## 2016-05-20 NOTE — Telephone Encounter (Signed)
Patients mother called and is requesting a note for [place] to take her daughter Mary Bryant out of work and to start short term disability. Her daughter was admitted due to the seizure she had yesterday. She states if her daughter is released today that she is required to work third shift today and she does not feel she is up to doing this.   I spoke with Amber regarding this and she will follow up on this as well.

## 2016-05-21 ENCOUNTER — Telehealth: Payer: Self-pay | Admitting: Internal Medicine

## 2016-05-21 ENCOUNTER — Telehealth: Payer: Self-pay | Admitting: General Surgery

## 2016-05-21 ENCOUNTER — Encounter: Payer: Self-pay | Admitting: Internal Medicine

## 2016-05-21 ENCOUNTER — Encounter: Payer: Self-pay | Admitting: General Surgery

## 2016-05-21 MED ORDER — HYDROCODONE-ACETAMINOPHEN 5-325 MG PO TABS: 1.0000 | ORAL_TABLET | Freq: Four times a day (QID) | ORAL | 0 refills | Status: DC | PRN

## 2016-05-21 MED ORDER — ONDANSETRON HCL 4 MG PO TABS: 4.0000 mg | ORAL_TABLET | Freq: Four times a day (QID) | ORAL | 0 refills | Status: DC | PRN

## 2016-05-21 MED ORDER — PANTOPRAZOLE SODIUM 40 MG PO TBEC: 40.0000 mg | DELAYED_RELEASE_TABLET | Freq: Every day | ORAL | 0 refills | Status: DC

## 2016-05-21 MED ORDER — ACYCLOVIR 400 MG PO TABS
400.0000 mg | ORAL_TABLET | Freq: Every day | ORAL | Status: DC
  Filled 2016-05-21: qty 1

## 2016-05-21 MED ORDER — ACYCLOVIR 200 MG PO CAPS
400.0000 mg | ORAL_CAPSULE | Freq: Every day | ORAL | Status: DC
  Administered 2016-05-21: 400 mg via ORAL
  Filled 2016-05-21: qty 2

## 2016-05-21 NOTE — Progress Notes (Signed)
Pt d/c to home today. IV removed intact.  Rx's given to pt w/all questions and concerns addressed.  D/C paperwork reviewed and education provided with all questions and concerns addressed.  Pt family at bedside for home transport.   

## 2016-05-21 NOTE — Discharge Summary (Signed)
[place] at [place] NAME: Mary Bryant    MR#:  [id number]  DATE OF BIRTH:  01-10-1966  DATE OF ADMISSION:  05/19/2016 ADMITTING PHYSICIAN: Lance Coon, MD  DATE OF DISCHARGE: 05/21/16  PRIMARY CARE PHYSICIAN: Crecencio Mc, MD    ADMISSION DIAGNOSIS:  Generalized abdominal pain [R10.84] Intractable vomiting with nausea, unspecified vomiting type [R11.2]  DISCHARGE DIAGNOSIS:  Principal Problem:   Abdominal pain with vomiting Active Problems:   Generalized anxiety disorder   CAD in native artery   Helicobacter pylori gastritis   Vomiting   SECONDARY DIAGNOSIS:   Past Medical History:  Diagnosis Date  . Esophageal stricture    dilated 2009,  [place]  . GERD (gastroesophageal reflux disease)   . History of hiatal hernia   . Hypertension    no prior treatment  . Insomnia    chronic, no prior sleep study  . Left bundle branch block   . PONV (postoperative nausea and vomiting)   . rhinitis allergic   . Wears contact lenses   . Wears hearing aid    bilateral    HOSPITAL COURSE:  Mary Bryant  is a 50 y.o. female admitted 05/19/2016 with chief complaint Abdominal Pain and Headache . Please see H&P performed by Lance Coon, MD for further information. Patient presented with the above symptoms secondary to known hiatal hernia, h pylori. She received symptomatic support with relief.  DISCHARGE CONDITIONS:   stable  CONSULTS OBTAINED:    DRUG ALLERGIES:   Allergies  Allergen Reactions  . Promethazine Anaphylaxis  . Benadryl [Diphenhydramine Hcl] Hives  . Promethazine Hcl Hives    DISCHARGE MEDICATIONS:   Current Discharge Medication List    START taking these medications   Details  ondansetron (ZOFRAN) 4 MG tablet Take 1 tablet (4 mg total) by mouth every 6 (six) hours as needed for nausea. Qty: 20 tablet, Refills: 0      CONTINUE these medications which have CHANGED   Details  !!  HYDROcodone-acetaminophen (NORCO/VICODIN) 5-325 MG tablet Take 1 tablet by mouth every 6 (six) hours as needed for moderate pain. Qty: 30 tablet, Refills: 0    !! pantoprazole (PROTONIX) 40 MG tablet Take 1 tablet (40 mg total) by mouth daily. Qty: 30 tablet, Refills: 0     !! - Potential duplicate medications found. Please discuss with provider.    CONTINUE these medications which have NOT CHANGED   Details  acetaminophen (TYLENOL) 500 MG tablet Take 1,000 mg by mouth every 6 (six) hours as needed for mild pain.    acyclovir (ZOVIRAX) 400 MG tablet Take 400 mg by mouth daily as needed.    acyclovir ointment (ZOVIRAX) 5 % Apply 1 application topically every 3 (three) hours.    ALPRAZolam (XANAX) 0.5 MG tablet TAKE 1 TABLET BY MOUTH ONCE DAILY Qty: 30 tablet, Refills: 2    amoxicillin (AMOXIL) 500 MG capsule Take 2 capsules (1,000 mg total) by mouth 2 (two) times daily. Qty: 40 capsule, Refills: 0    calcium carbonate (TUMS) 500 MG chewable tablet Chew 1 tablet (200 mg of elemental calcium total) by mouth 3 (three) times daily with meals. Qty: 90 tablet, Refills: 0    carbamazepine (TEGRETOL) 200 MG tablet TAKE 1 TABLET (200 MG TOTAL) BY MOUTH 3 (THREE) TIMES DAILY. Qty: 90 tablet, Refills: 3    clarithromycin (BIAXIN) 250 MG tablet Take 2 tablets (500 mg total) by mouth 2 (two) times daily. Qty: 40 tablet, Refills:  0    cyanocobalamin 1000 MCG tablet Take 100 mcg by mouth daily.    ergocalciferol (VITAMIN D2) 50000 units capsule Take 1 capsule (50,000 Units total) by mouth once a week. Qty: 4 capsule, Refills: 2    escitalopram (LEXAPRO) 10 MG tablet TAKE 1 TABLET BY MOUTH ONCE DAILY Qty: 30 tablet, Refills: 2    !! HYDROcodone-acetaminophen (NORCO/VICODIN) 5-325 MG per tablet Take 1 tablet by mouth every 6 (six) hours as needed for moderate pain. Qty: 30 tablet, Refills: 0    metoprolol succinate (TOPROL-XL) 25 MG 24 hr tablet TAKE 1 TABLET BY MOUTH ONCE DAILY Qty: 30  tablet, Refills: 2    !! pantoprazole (PROTONIX) 20 MG tablet Take 1 tablet (20 mg total) by mouth daily. Qty: 30 tablet, Refills: 1     !! - Potential duplicate medications found. Please discuss with provider.       DISCHARGE INSTRUCTIONS:    DIET:  Regular diet  DISCHARGE CONDITION:  Stable  ACTIVITY:  Activity as tolerated  OXYGEN:  Home Oxygen: No.   Oxygen Delivery: room air  DISCHARGE LOCATION:  home   If you experience worsening of your admission symptoms, develop shortness of breath, life threatening emergency, suicidal or homicidal thoughts you must seek medical attention immediately by calling 911 or calling your MD immediately  if symptoms less severe.  You Must read complete instructions/literature along with all the possible adverse reactions/side effects for all the Medicines you take and that have been prescribed to you. Take any new Medicines after you have completely understood and accpet all the possible adverse reactions/side effects.   Please note  You were cared for by a hospitalist during your hospital stay. If you have any questions about your discharge medications or the care you received while you were in the hospital after you are discharged, you can call the unit and asked to speak with the hospitalist on call if the hospitalist that took care of you is not available. Once you are discharged, your primary care physician will handle any further medical issues. Please note that NO REFILLS for any discharge medications will be authorized once you are discharged, as it is imperative that you return to your primary care physician (or establish a relationship with a primary care physician if you do not have one) for your aftercare needs so that they can reassess your need for medications and monitor your lab values.    On the day of Discharge:   VITAL SIGNS:  Blood pressure 126/78, pulse 71, temperature 98.5 F (36.9 C), temperature source Oral, resp.  rate 17, height 5\' 6"  (1.676 m), weight 90.3 kg (199 lb), SpO2 98 %.  I/O:   Intake/Output Summary (Last 24 hours) at 05/21/16 0910 Last data filed at 05/21/16 [id number]  Gross per 24 hour  Intake             1238 ml  Output             1800 ml  Net             -562 ml    PHYSICAL EXAMINATION:  GENERAL:  50 y.o.-year-old patient lying in the bed with no acute distress.  EYES: Pupils equal, round, reactive to light and accommodation. No scleral icterus. Extraocular muscles intact.  HEENT: Head atraumatic, normocephalic. Oropharynx and nasopharynx clear.  NECK:  Supple, no jugular venous distention. No thyroid enlargement, no tenderness.  LUNGS: Normal breath sounds bilaterally, no wheezing, rales,rhonchi or crepitation. No use  of accessory muscles of respiration.  CARDIOVASCULAR: S1, S2 normal. No murmurs, rubs, or gallops.  ABDOMEN: Soft, non-tender, non-distended. Bowel sounds present. No organomegaly or mass.  EXTREMITIES: No pedal edema, cyanosis, or clubbing.  NEUROLOGIC: Cranial nerves II through XII are intact. Muscle strength 5/5 in all extremities. Sensation intact. Gait not checked.  PSYCHIATRIC: The patient is alert and oriented x 3.  SKIN: No obvious rash, lesion, or ulcer.   DATA REVIEW:   CBC  Recent Labs Lab 05/20/16 0430  WBC 11.0  HGB 12.1  HCT 36.2  PLT 258    Chemistries   Recent Labs Lab 05/19/16 1535 05/20/16 0430  NA 140 141  K 4.1 3.9  CL 107 107  CO2 25 26  GLUCOSE 135* 95  BUN 11 9  CREATININE 0.90 0.74  CALCIUM 9.4 8.5*  AST 27  --   ALT 20  --   ALKPHOS 105  --   BILITOT 0.6  --     Cardiac Enzymes No results for input(s): TROPONINI in the last 168 hours.  Microbiology Results  Results for orders placed or performed in visit on 02/26/14  Viral culture     Status: None   Collection Time: 02/26/14 11:40 AM  Result Value Ref Range Status   Organism ID, Bacteria   Final    Herpes Simplex Virus Type 2 Detected by  Cell Culture    Wound culture     Status: None   Collection Time: 02/26/14 11:40 AM  Result Value Ref Range Status   Gram Stain No WBC Seen  Final   Gram Stain No Squamous Epithelial Cells Seen  Final   Gram Stain No Organisms Seen  Final   Organism ID, Bacteria NO GROWTH 2 DAYS  Final    RADIOLOGY:  Ct Abdomen Pelvis W Contrast  Result Date: 05/19/2016 CLINICAL DATA:  Severe generalized abdominal pain, nausea and vomiting. Prior cholecystectomy and hysterectomy. EXAM: CT ABDOMEN AND PELVIS WITH CONTRAST TECHNIQUE: Multidetector CT imaging of the abdomen and pelvis was performed using the standard protocol following bolus administration of intravenous contrast. CONTRAST:  132mL ISOVUE-300 IOPAMIDOL (ISOVUE-300) INJECTION 61% COMPARISON:  02/26/2016 CT abdomen/ pelvis. Abdominal radiographs from earlier today. FINDINGS: Lower chest: No significant pulmonary nodules or acute consolidative airspace disease. Hepatobiliary: Normal liver with no liver mass. Cholecystectomy No biliary ductal dilatation. Pancreas: Normal, with no mass or duct dilation. Spleen: Normal size. No mass. Adrenals/Urinary Tract: Normal adrenals. No hydronephrosis. No renal masses. Normal bladder. Stomach/Bowel: Small to moderate hiatal hernia, stable. Otherwise relatively collapsed and grossly normal stomach. Normal caliber small bowel with no small bowel wall thickening. Normal appendix. Normal large bowel with no diverticulosis, large bowel wall thickening or pericolonic fat stranding. Vascular/Lymphatic: Normal caliber abdominal aorta. Patent portal, splenic, hepatic and renal veins. No pathologically enlarged lymph nodes in the abdomen or pelvis. Reproductive: Status post hysterectomy, with no abnormal findings at the vaginal cuff. No adnexal mass. Other: No pneumoperitoneum, ascites or focal fluid collection. Musculoskeletal: No aggressive appearing focal osseous lesions. Subcentimeter sclerotic foci in the left acetabulum are stable back to  the 12/25/2005 CT, consistent with benign bone islands. Moderate degenerative changes at L5-S1. IMPRESSION: 1. No acute abnormality. No evidence of bowel obstruction or acute bowel inflammation. Normal appendix. 2. Stable small to moderate hiatal hernia. Electronically Signed   By: Ilona Sorrel M.D.   On: 05/19/2016 21:16   Dg Abd 2 Views  Result Date: 05/19/2016 CLINICAL DATA:  Nausea and vomiting. EXAM: ABDOMEN - 2  VIEW COMPARISON:  CT abdomen pelvis 02/26/2016. FINDINGS: Stool is seen throughout the colon. No small bowel dilatation. Surgical clips in the right upper quadrant. Lung bases are clear. IMPRESSION: Stool in the majority of the colon is indicative of constipation. Electronically Signed   By: Lorin Picket M.D.   On: 05/19/2016 18:10     Management plans discussed with the patient, family and they are in agreement.  CODE STATUS:     Code Status Orders        Start     Ordered   05/20/16 0011  Full code  Continuous     05/20/16 0010    Code Status History    Date Active Date Inactive Code Status Order ID Comments User Context   This patient has a current code status but no historical code status.      TOTAL TIME TAKING CARE OF THIS PATIENT: 33 minutes.    Sharin Altidor,  Karenann Cai.D on 05/21/2016 at 9:10 AM  Between 7am to 6pm - Pager - [phone]  After 6pm go to [email or web address] - [place]  [facility] [place]  Office  [phone]  CC: Primary care physician; Crecencio Mc, MD

## 2016-05-21 NOTE — Telephone Encounter (Signed)
Patient mother notified as directed.

## 2016-05-21 NOTE — Telephone Encounter (Signed)
Phone call returned to patient at this time. Someone answered phone and immediately hung up.  Attempted to call back but no answer.

## 2016-05-21 NOTE — Plan of Care (Signed)
Problem: Fluid Volume: Goal: Ability to maintain a balanced intake and output will improve Outcome: Progressing C/o slight nausea relieved with gingerale and Zofran; states the meds are making her sick. Denies abdominal pain at this time.

## 2016-05-21 NOTE — Telephone Encounter (Signed)
That appt is fine please schedule, I will call for TCM today, thanks

## 2016-05-21 NOTE — Telephone Encounter (Signed)
Patient left a voice message that she would like to speak to Dr. Reginal Lutes nurse. She did not leave any details on what she wanted. Please call

## 2016-05-21 NOTE — Telephone Encounter (Signed)
4.30 next Friday the only place PCP has open? TCM needed.

## 2016-05-21 NOTE — Progress Notes (Signed)
Discharge instructions explained and given to the patient.Prescription for norco given to patient. Follow up appointments discussed with patient.questions were encouraged and answered. Patient escortedvia wheelchair and nursing staff. Mary Bryant

## 2016-05-21 NOTE — Progress Notes (Signed)
   [place], [place] [address]  May 21, 2016  Patient:  Mary Bryant Date of Birth: [birth year] Date of Visit:  05/19/2016  To Whom it May Concern:  Please excuse [name] from work from 05/19/2016 until 05/21/16 as she was admitted to the [facility] for medical treatment and has been receiving appropriate care. She may return to work on 05/24/16, sooner if she feels she is able to return sooner than this date.      Please don't hesitate to contact me with questions or concerns by calling  [phone] and asking them to page me directly.   Regino Schultze, MD

## 2016-05-21 NOTE — Telephone Encounter (Signed)
Tiasha called from [facility] pt needs a 1 week hospital follow up. Please advise where to schedule. Thank you!  Call pt @ [phone]

## 2016-05-24 ENCOUNTER — Telehealth: Payer: Self-pay

## 2016-05-24 NOTE — Telephone Encounter (Signed)
Noted, I repeated that to her, thanks

## 2016-05-24 NOTE — Telephone Encounter (Signed)
Mary Bryant called to say [name] called and scheduled her Esophageal Manometry 07/28/16 and wanted to let [name] know. Patient stated [name] wanted to do her Hernia repair before the end of the year and since this is so late what should she do.  She requested to be on the waiting list and possibly be worked in sooner if cancellations occur.

## 2016-05-24 NOTE — Telephone Encounter (Signed)
Transition Care Management Follow-up Telephone Call  How have you been since you were released from the hospital? Extremely sore, painful   Do you understand why you were in the hospital? yes   Do you understand the discharge instrcutions? No questions, short term disability- Esophagus/hernia surgery, general practitioner to take her out of work.  She will have to start the process over if she has to go to work tonight due to the disability kicking in tomorrow.  She is not trying to work the system   Items Reviewed:  Medications reviewed: no changes, still on H.Ployri  medications  Allergies reviewed: no changes  Dietary changes reviewed: not at the current time, will be on a oral/liquid for a month  Referrals reviewed: [place] for the esophagus test and then hernia surgery.    Functional Questionnaire:   Activities of Daily Living (ADLs):   She states they are independent in the following: everything, just sore, so taking it easy.  States they require assistance with the following: nothing   Any transportation issues/concerns?: no concerns   Any patient concerns? See above   Confirmed importance and date/time of follow-up visits scheduled:  Yes, appt on Friday the 10th at 430pm  Confirmed with patient if condition begins to worsen call PCP or go to the ER.  Patient was given the Call-a-Nurse line [phone]: yes confirmed

## 2016-05-24 NOTE — Telephone Encounter (Signed)
As I told her in her e mail .  I am not going to support short term disability for hiatal hernia, only for the appointments she has.

## 2016-05-28 ENCOUNTER — Ambulatory Visit (INDEPENDENT_AMBULATORY_CARE_PROVIDER_SITE_OTHER): Payer: 59 | Admitting: Internal Medicine

## 2016-05-28 ENCOUNTER — Encounter: Payer: Self-pay | Admitting: Internal Medicine

## 2016-05-28 DIAGNOSIS — K449 Diaphragmatic hernia without obstruction or gangrene: Secondary | ICD-10-CM | POA: Diagnosis not present

## 2016-05-28 DIAGNOSIS — K2100 Gastro-esophageal reflux disease with esophagitis, without bleeding: Secondary | ICD-10-CM

## 2016-05-28 DIAGNOSIS — Z09 Encounter for follow-up examination after completed treatment for conditions other than malignant neoplasm: Secondary | ICD-10-CM

## 2016-05-28 MED ORDER — ACYCLOVIR 5 % EX OINT: 1.0000 "application " | TOPICAL_OINTMENT | Freq: Two times a day (BID) | CUTANEOUS | 3 refills | Status: DC

## 2016-05-28 NOTE — Progress Notes (Signed)
Subjective:  Patient ID: Mary Bryant, female    DOB: 1966/01/08  Age: 50 y.o. MRN: [id number]  CC: Diagnoses of Hospital discharge follow-up and Hiatal hernia with GERD and esophagitis were pertinent to this visit.  HPI Mary Bryant presents for hospital follow up,  Admitted to [facility]  with nausea and vomiting and generalized abdominal pain on Nov 1 and discharged on Nov 3rd. Symptoms were attributed to hiatal hernia and initiation of antibiotic therapy for H Pylori gastritis .  She was tolerating a regular diet by Nov 2 and was discharged on Nov 3 with rx for Zofran and has follow up at the motility clinic at [facility]  For manometry testing ordered by Dr Adonis Huguenin in preparation for hiatal hernia surgery.  Request for in house surgical consult was denied , as well as request for short term disability.      She has had no vomiting since discharge.  Has constant nausea but still on treatment for H Pylori gastritis on Sunday.     Hungry but avoiding eating because of the nausea. No appreciable weight loss   costchondritis of right side, taking protonix   Patient states that she feels constant abdominal pressure  Aggravated by bending over.   Has had EGD.  Waiting for the esophageal manometry study to be done prior to the hernia repair.    Not eligibile for FMLA yet bcecause of time on job but does get short term     Outpatient Medications Prior to Visit  Medication Sig Dispense Refill  . acetaminophen (TYLENOL) 500 MG tablet Take 1,000 mg by mouth every 6 (six) hours as needed for mild pain.    Marland Kitchen acyclovir (ZOVIRAX) 400 MG tablet Take 400 mg by mouth daily as needed.    . ALPRAZolam (XANAX) 0.5 MG tablet TAKE 1 TABLET BY MOUTH ONCE DAILY 30 tablet 2  . amoxicillin (AMOXIL) 500 MG capsule Take 2 capsules (1,000 mg total) by mouth 2 (two) times daily. 40 capsule 0  . calcium carbonate (TUMS) 500 MG chewable tablet Chew 1 tablet (200 mg of elemental calcium total) by mouth 3 (three) times  daily with meals. 90 tablet 0  . carbamazepine (TEGRETOL) 200 MG tablet TAKE 1 TABLET (200 MG TOTAL) BY MOUTH 3 (THREE) TIMES DAILY. 90 tablet 3  . clarithromycin (BIAXIN) 250 MG tablet Take 2 tablets (500 mg total) by mouth 2 (two) times daily. 40 tablet 0  . cyanocobalamin 1000 MCG tablet Take 100 mcg by mouth daily.    . ergocalciferol (VITAMIN D2) 50000 units capsule Take 1 capsule (50,000 Units total) by mouth once a week. 4 capsule 2  . escitalopram (LEXAPRO) 10 MG tablet TAKE 1 TABLET BY MOUTH ONCE DAILY 30 tablet 2  . HYDROcodone-acetaminophen (NORCO/VICODIN) 5-325 MG per tablet Take 1 tablet by mouth every 6 (six) hours as needed for moderate pain. 30 tablet 0  . HYDROcodone-acetaminophen (NORCO/VICODIN) 5-325 MG tablet Take 1 tablet by mouth every 6 (six) hours as needed for moderate pain. 30 tablet 0  . metoprolol succinate (TOPROL-XL) 25 MG 24 hr tablet TAKE 1 TABLET BY MOUTH ONCE DAILY 30 tablet 2  . ondansetron (ZOFRAN) 4 MG tablet Take 1 tablet (4 mg total) by mouth every 6 (six) hours as needed for nausea. 20 tablet 0  . pantoprazole (PROTONIX) 20 MG tablet Take 1 tablet (20 mg total) by mouth daily. 30 tablet 1  . pantoprazole (PROTONIX) 40 MG tablet Take 1 tablet (40 mg total) by mouth  daily. 30 tablet 0  . acyclovir ointment (ZOVIRAX) 5 % Apply 1 application topically every 3 (three) hours.     No facility-administered medications prior to visit.     Review of Systems;  Patient denies headache, fevers, malaise, unintentional weight loss, skin rash, eye pain, sinus congestion and sinus pain, sore throat, dysphagia,  hemoptysis , cough, dyspnea, wheezing, chest pain, palpitations, orthopnea, edema, abdominal pain, nausea, melena, diarrhea, constipation, flank pain, dysuria, hematuria, urinary  Frequency, nocturia, numbness, tingling, seizures,  Focal weakness, Loss of consciousness,  Tremor, insomnia, depression, anxiety, and suicidal ideation.      Objective:  BP 136/90    Pulse 77   Temp 98 F (36.7 C) (Oral)   Ht 5\' 6"  (1.676 m)   Wt 198 lb 12.8 oz (90.2 kg)   SpO2 97%   BMI 32.09 kg/m   BP Readings from Last 3 Encounters:  05/28/16 136/90  05/21/16 129/83  05/18/16 (!) 141/76    Wt Readings from Last 3 Encounters:  05/28/16 198 lb 12.8 oz (90.2 kg)  05/20/16 199 lb (90.3 kg)  05/18/16 200 lb (90.7 kg)    General appearance: alert, cooperative and appears stated age Ears: normal TM's and external ear canals both ears Throat: lips, mucosa, and tongue normal; teeth and gums normal Neck: no adenopathy, no carotid bruit, supple, symmetrical, trachea midline and thyroid not enlarged, symmetric, no tenderness/mass/nodules Back: symmetric, no curvature. ROM normal. No CVA tenderness. Lungs: clear to auscultation bilaterally Heart: regular rate and rhythm, S1, S2 normal, no murmur, click, rub or gallop Abdomen: soft, non-tender; bowel sounds normal; no masses,  no organomegaly Pulses: 2+ and symmetric Skin: Skin color, texture, turgor normal. No rashes or lesions Lymph nodes: Cervical, supraclavicular, and axillary nodes normal.  Lab Results  Component Value Date   HGBA1C 5.7 05/19/2011    Lab Results  Component Value Date   CREATININE 0.74 05/20/2016   CREATININE 0.90 05/19/2016   CREATININE 0.86 05/10/2016    Lab Results  Component Value Date   WBC 11.0 05/20/2016   HGB 12.1 05/20/2016   HCT 36.2 05/20/2016   PLT 258 05/20/2016   GLUCOSE 95 05/20/2016   CHOL 215 (H) 05/10/2016   TRIG 129.0 05/10/2016   HDL 62.90 05/10/2016   LDLCALC 126 (H) 05/10/2016   ALT 20 05/19/2016   AST 27 05/19/2016   NA 141 05/20/2016   K 3.9 05/20/2016   CL 107 05/20/2016   CREATININE 0.74 05/20/2016   BUN 9 05/20/2016   CO2 26 05/20/2016   TSH 2.85 05/10/2016   HGBA1C 5.7 05/19/2011   MICROALBUR 1.4 05/08/2012    Ct Abdomen Pelvis W Contrast  Result Date: 05/19/2016 CLINICAL DATA:  Severe generalized abdominal pain, nausea and vomiting.  Prior cholecystectomy and hysterectomy. EXAM: CT ABDOMEN AND PELVIS WITH CONTRAST TECHNIQUE: Multidetector CT imaging of the abdomen and pelvis was performed using the standard protocol following bolus administration of intravenous contrast. CONTRAST:  145mL ISOVUE-300 IOPAMIDOL (ISOVUE-300) INJECTION 61% COMPARISON:  02/26/2016 CT abdomen/ pelvis. Abdominal radiographs from earlier today. FINDINGS: Lower chest: No significant pulmonary nodules or acute consolidative airspace disease. Hepatobiliary: Normal liver with no liver mass. Cholecystectomy No biliary ductal dilatation. Pancreas: Normal, with no mass or duct dilation. Spleen: Normal size. No mass. Adrenals/Urinary Tract: Normal adrenals. No hydronephrosis. No renal masses. Normal bladder. Stomach/Bowel: Small to moderate hiatal hernia, stable. Otherwise relatively collapsed and grossly normal stomach. Normal caliber small bowel with no small bowel wall thickening. Normal appendix. Normal large bowel with  no diverticulosis, large bowel wall thickening or pericolonic fat stranding. Vascular/Lymphatic: Normal caliber abdominal aorta. Patent portal, splenic, hepatic and renal veins. No pathologically enlarged lymph nodes in the abdomen or pelvis. Reproductive: Status post hysterectomy, with no abnormal findings at the vaginal cuff. No adnexal mass. Other: No pneumoperitoneum, ascites or focal fluid collection. Musculoskeletal: No aggressive appearing focal osseous lesions. Subcentimeter sclerotic foci in the left acetabulum are stable back to the 12/25/2005 CT, consistent with benign bone islands. Moderate degenerative changes at L5-S1. IMPRESSION: 1. No acute abnormality. No evidence of bowel obstruction or acute bowel inflammation. Normal appendix. 2. Stable small to moderate hiatal hernia. Electronically Signed   By: Ilona Sorrel M.D.   On: 05/19/2016 21:16   Dg Abd 2 Views  Result Date: 05/19/2016 CLINICAL DATA:  Nausea and vomiting. EXAM: ABDOMEN - 2  VIEW COMPARISON:  CT abdomen pelvis 02/26/2016. FINDINGS: Stool is seen throughout the colon. No small bowel dilatation. Surgical clips in the right upper quadrant. Lung bases are clear. IMPRESSION: Stool in the majority of the colon is indicative of constipation. Electronically Signed   By: Lorin Picket M.D.   On: 05/19/2016 18:10    Assessment & Plan:   Problem List Items Addressed This Visit    Hiatal hernia with GERD and esophagitis    Reassured patient that her hiatal hernia was an unlikely cause of her abdominal pain.  Continue treatment ofr H Pylori gastritis.       Hospital discharge follow-up    Patient is stable post discharge and has no new issues or questions about discharge plans at the visit today for hospital follow up.  I have reviewed the records from the hospital admission in detail with patient today.         I have changed Ms. Manlove's acyclovir ointment. I am also having her maintain her acetaminophen, cyanocobalamin, HYDROcodone-acetaminophen, carbamazepine, ALPRAZolam, escitalopram, metoprolol succinate, pantoprazole, calcium carbonate, ergocalciferol, clarithromycin, amoxicillin, acyclovir, ondansetron, pantoprazole, and HYDROcodone-acetaminophen.  Meds ordered this encounter  Medications  . acyclovir ointment (ZOVIRAX) 5 %    Sig: Apply 1 application topically 2 (two) times daily. As needed for fever blisters    Dispense:  14 g    Refill:  3    Keep on file for future refills    Medications Discontinued During This Encounter  Medication Reason  . acyclovir ointment (ZOVIRAX) 5 %     Follow-up: No Follow-up on file.   Crecencio Mc, MD

## 2016-05-28 NOTE — Progress Notes (Signed)
Pre visit review using our clinic review tool, if applicable. No additional management support is needed unless otherwise documented below in the visit note. 

## 2016-05-30 DIAGNOSIS — Z09 Encounter for follow-up examination after completed treatment for conditions other than malignant neoplasm: Secondary | ICD-10-CM | POA: Insufficient documentation

## 2016-05-30 NOTE — Assessment & Plan Note (Signed)
Patient is stable post discharge and has no new issues or questions about discharge plans at the visit today for hospital follow up.  I have reviewed the records from the hospital admission in detail with patient today. 

## 2016-05-30 NOTE — Assessment & Plan Note (Signed)
Reassured patient that her hiatal hernia was an unlikely cause of her abdominal pain.  Continue treatment ofr H Pylori gastritis.

## 2016-06-01 ENCOUNTER — Telehealth: Payer: Self-pay

## 2016-06-01 NOTE — Telephone Encounter (Signed)
Mary Bryant called to let us know she is continuing to have nausea from her hernia. She asked if there was anything else she could do. We discussed the Manometry that she is having at [facility] in January. We discussed that she is on the waiting list and that [facility] will call her when or if they have a cancellation She was instructed to continue taking the Zofran and hopefully [facility] will call with a sooner appointment.

## 2016-06-04 ENCOUNTER — Telehealth: Payer: Self-pay

## 2016-06-04 ENCOUNTER — Telehealth: Payer: Self-pay | Admitting: Internal Medicine

## 2016-06-04 ENCOUNTER — Other Ambulatory Visit: Payer: Self-pay | Admitting: Internal Medicine

## 2016-06-04 NOTE — Telephone Encounter (Signed)
Patient's grandfather came in to our office and dropped off patient's disability forms to be filled out. However, Dr. Adonis Huguenin had stated that we were not filling out disability forms until after surgery. This was explained to patient on an e-mail (05-21-2016) when she had asked before. Therefore, I mailed her the e-mail with Dr. Reginal Lutes explanation of why her disability form will not be filled out at this time.

## 2016-06-04 NOTE — Telephone Encounter (Signed)
Pt dropped off FMLA paperwork that needs to be completed by 06/08/16.Marland Kitchen Placed in Dr. Demetrios Isaacs folder up front.. Please advise pt with any questions

## 2016-06-04 NOTE — Telephone Encounter (Signed)
I wanted to add from my previous note that patient was requesting intermittent leave and was informed via e-mail that Dr. Adonis Huguenin would not fill out disability form until after she had surgery since he did not think that patient needed to be out of work.

## 2016-06-07 NOTE — Telephone Encounter (Signed)
Paper work scan for chart , faxed and patient notified to pick up placed at front desk. Billing copy made .

## 2016-06-08 ENCOUNTER — Ambulatory Visit: Payer: 59 | Admitting: Gastroenterology

## 2016-06-08 ENCOUNTER — Encounter: Payer: Self-pay | Admitting: Gastroenterology

## 2016-06-09 ENCOUNTER — Ambulatory Visit (INDEPENDENT_AMBULATORY_CARE_PROVIDER_SITE_OTHER): Payer: 59 | Admitting: Gastroenterology

## 2016-06-09 ENCOUNTER — Encounter: Payer: Self-pay | Admitting: Gastroenterology

## 2016-06-09 VITALS — BP 133/84 | HR 73 | Temp 98.2°F | Ht 66.0 in | Wt 198.6 lb

## 2016-06-09 DIAGNOSIS — K219 Gastro-esophageal reflux disease without esophagitis: Secondary | ICD-10-CM | POA: Diagnosis not present

## 2016-06-09 MED ORDER — SUCRALFATE 1 GM/10ML PO SUSP: 1.0000 g | Freq: Three times a day (TID) | ORAL | 0 refills | Status: DC

## 2016-06-09 NOTE — Progress Notes (Signed)
Primary Care Physician: Crecencio Mc, MD  Primary Gastroenterologist:  Dr. Jonathon Bellows   Chief Complaint  Patient presents with  . Follow-up    4 week post EGD    HPI: Mary Bryant is a 50 y.o. female here for follow up for abdominal pain . She/He was last seen on 05/17/16. My impression on her initial visit was that her pain was more musculoskeletal in nature.She had a H pylori antibody positive ordered by her surgeon , the stool antigen was negative which I ordered. She was admitted on Nov 1-3 for nausea and vomiting after starting antibiotics for H pylori. Treated conservatively and discharged on Zofran.She is been scheduled for a surgery for her hiatal hernia with prior esophageal motility testing .   EGD was performed by myself on 05/12/16 which was normal except for a 5 cm sliding hiatal hernia.   She completed the treatment for H pylori about a week back. Continues to have acid regurgitation,worse when she bends down.  She is on Protonix once daily 40 mg. Takes Zantac at night . Using the wedge pillow.   May have lost 1 lb of weight. Not much abdominal pain which she had previously.    Current Outpatient Prescriptions  Medication Sig Dispense Refill  . acetaminophen (TYLENOL) 500 MG tablet Take 1,000 mg by mouth every 6 (six) hours as needed for mild pain.    Marland Kitchen acyclovir ointment (ZOVIRAX) 5 % Apply 1 application topically 2 (two) times daily. As needed for fever blisters 14 g 3  . ALPRAZolam (XANAX) 0.5 MG tablet TAKE 1 TABLET BY MOUTH ONCE DAILY 30 tablet 2  . carbamazepine (TEGRETOL) 200 MG tablet TAKE 1 TABLET BY MOUTH 3 TIMES DAILY 270 tablet 0  . cyanocobalamin 1000 MCG tablet Take 100 mcg by mouth daily.    . ergocalciferol (VITAMIN D2) 50000 units capsule Take 1 capsule (50,000 Units total) by mouth once a week. 4 capsule 2  . escitalopram (LEXAPRO) 10 MG tablet TAKE 1 TABLET BY MOUTH ONCE DAILY 30 tablet 2  . metoprolol succinate (TOPROL-XL) 25 MG 24 hr tablet  TAKE 1 TABLET BY MOUTH ONCE DAILY 30 tablet 2  . ondansetron (ZOFRAN) 4 MG tablet Take 1 tablet (4 mg total) by mouth every 6 (six) hours as needed for nausea. 20 tablet 0  . pantoprazole (PROTONIX) 40 MG tablet Take 1 tablet (40 mg total) by mouth daily. 30 tablet 0   No current facility-administered medications for this visit.     Allergies as of 06/09/2016 - Review Complete 06/09/2016  Allergen Reaction Noted  . Promethazine Anaphylaxis 04/04/2015  . Benadryl [diphenhydramine hcl] Hives 05/17/2011  . Promethazine hcl Hives 05/17/2011    ROS:  General: Negative for anorexia, weight loss, fever, chills, fatigue, weakness. ENT: Negative for hoarseness, difficulty swallowing , nasal congestion. CV: Negative for chest pain, angina, palpitations, dyspnea on exertion, peripheral edema.  Respiratory: Negative for dyspnea at rest, dyspnea on exertion, cough, sputum, wheezing.  GI: See history of present illness. GU:  Negative for dysuria, hematuria, urinary incontinence, urinary frequency, nocturnal urination.  Endo: Negative for unusual weight change.    Physical Examination:   BP 133/84   Pulse 73   Temp 98.2 F (36.8 C) (Oral)   Ht 5\' 6"  (1.676 m)   Wt 198 lb 9.6 oz (90.1 kg)   BMI 32.05 kg/m   General: Well-nourished, well-developed in no acute distress.  Eyes: No icterus. Conjunctivae pink. Mouth: Oropharyngeal mucosa moist and pink ,  no lesions erythema or exudate. Lungs: Clear to auscultation bilaterally. Non-labored. Heart: Regular rate and rhythm, no murmurs rubs or gallops.  Abdomen: Bowel sounds are normal, nontender, nondistended, no hepatosplenomegaly or masses, no abdominal bruits or hernia , no rebound or guarding.   Extremities: No lower extremity edema. No clubbing or deformities. Neuro: Alert and oriented x 3.  Grossly intact. Skin: Warm and dry, no jaundice.   Psych: Alert and cooperative, normal mood and affect.  Imaging Studies: Ct Abdomen Pelvis W  Contrast  Result Date: 05/19/2016 CLINICAL DATA:  Severe generalized abdominal pain, nausea and vomiting. Prior cholecystectomy and hysterectomy. EXAM: CT ABDOMEN AND PELVIS WITH CONTRAST TECHNIQUE: Multidetector CT imaging of the abdomen and pelvis was performed using the standard protocol following bolus administration of intravenous contrast. CONTRAST:  174mL ISOVUE-300 IOPAMIDOL (ISOVUE-300) INJECTION 61% COMPARISON:  02/26/2016 CT abdomen/ pelvis. Abdominal radiographs from earlier today. FINDINGS: Lower chest: No significant pulmonary nodules or acute consolidative airspace disease. Hepatobiliary: Normal liver with no liver mass. Cholecystectomy No biliary ductal dilatation. Pancreas: Normal, with no mass or duct dilation. Spleen: Normal size. No mass. Adrenals/Urinary Tract: Normal adrenals. No hydronephrosis. No renal masses. Normal bladder. Stomach/Bowel: Small to moderate hiatal hernia, stable. Otherwise relatively collapsed and grossly normal stomach. Normal caliber small bowel with no small bowel wall thickening. Normal appendix. Normal large bowel with no diverticulosis, large bowel wall thickening or pericolonic fat stranding. Vascular/Lymphatic: Normal caliber abdominal aorta. Patent portal, splenic, hepatic and renal veins. No pathologically enlarged lymph nodes in the abdomen or pelvis. Reproductive: Status post hysterectomy, with no abnormal findings at the vaginal cuff. No adnexal mass. Other: No pneumoperitoneum, ascites or focal fluid collection. Musculoskeletal: No aggressive appearing focal osseous lesions. Subcentimeter sclerotic foci in the left acetabulum are stable back to the 12/25/2005 CT, consistent with benign bone islands. Moderate degenerative changes at L5-S1. IMPRESSION: 1. No acute abnormality. No evidence of bowel obstruction or acute bowel inflammation. Normal appendix. 2. Stable small to moderate hiatal hernia. Electronically Signed   By: Ilona Sorrel M.D.   On: 05/19/2016  21:16   Dg Abd 2 Views  Result Date: 05/19/2016 CLINICAL DATA:  Nausea and vomiting. EXAM: ABDOMEN - 2 VIEW COMPARISON:  CT abdomen pelvis 02/26/2016. FINDINGS: Stool is seen throughout the colon. No small bowel dilatation. Surgical clips in the right upper quadrant. Lung bases are clear. IMPRESSION: Stool in the majority of the colon is indicative of constipation. Electronically Signed   By: Lorin Picket M.D.   On: 05/19/2016 18:10    Assessment and Plan:   Mary Bryant is a 50 y.o. y/o female is here for follow up for abdominal pain . The abdominal pain is better, continues to have severe reflux and she is being planned for surgery .     Plan   1. Take the Zantac at night rather than in am . Take protonix 40 mg once daily in the AM before breakfast. Continue use of wedge pillow. PRN use of carafate.  Dr Jonathon Bellows  MD Follow up in 4 months

## 2016-06-09 NOTE — Patient Instructions (Addendum)
Hiatal Hernia A hiatal hernia occurs when part of your stomach slides above the muscle that separates your abdomen from your chest (diaphragm). You can be born with a hiatal hernia (congenital), or it may develop over time. In almost all cases of hiatal hernia, only the top part of the stomach pushes through.  Many people have a hiatal hernia with no symptoms. The larger the hernia, the more likely that you will have symptoms. In some cases, a hiatal hernia allows stomach acid to flow back into the tube that carries food from your mouth to your stomach (esophagus). This may cause heartburn symptoms. Severe heartburn symptoms may mean you have developed a condition called gastroesophageal reflux disease (GERD).  CAUSES  Hiatal hernias are caused by a weakness in the opening (hiatus) where your esophagus passes through your diaphragm to attach to the upper part of your stomach. You may be born with a weakness in your hiatus, or a weakness can develop. RISK FACTORS Older age is a major risk factor for a hiatal hernia. Anything that increases pressure on your diaphragm can also increase your risk of a hiatal hernia. This includes:  Pregnancy.  Excess weight.  Frequent constipation. SIGNS AND SYMPTOMS  People with a hiatal hernia often have no symptoms. If symptoms develop, they are almost always caused by GERD. They may include:  Heartburn.  Belching.  Indigestion.  Trouble swallowing.  Coughing or wheezing.  Sore throat.  Hoarseness.  Chest pain. DIAGNOSIS  A hiatal hernia is sometimes found during an exam for another problem. Your health care provider may suspect a hiatal hernia if you have symptoms of GERD. Tests may be done to diagnose GERD. These may include:  X-rays of your stomach or chest.  An upper gastrointestinal (GI) series. This is an X-ray exam of your GI tract involving the use of a chalky liquid that you swallow. The liquid shows up clearly on the X-ray.  Endoscopy.  This is a procedure to look into your stomach using a thin, flexible tube that has a tiny camera and light on the end of it. TREATMENT  If you have no symptoms, you may not need treatment. If you have symptoms, treatment may include:  Dietary and lifestyle changes to help reduce GERD symptoms.  Medicines. These may include:  Over-the-counter antacids.  Medicines that make your stomach empty more quickly.  Medicines that block the production of stomach acid (H2 blockers).  Stronger medicines to reduce stomach acid (proton pump inhibitors).  You may need surgery to repair the hernia if other treatments are not helping. HOME CARE INSTRUCTIONS   Take all medicines as directed by your health care provider.  Quit smoking, if you smoke.  Try to achieve and maintain a healthy body weight.  Eat frequent small meals instead of three large meals a day. This keeps your stomach from getting too full.  Eat slowly.  Do not lie down right after eating.  Do noteat 1-2 hours before bed.   Do not drink beverages with caffeine. These include cola, coffee, cocoa, and tea.  Do not drink alcohol.  Avoid foods that can make symptoms of GERD worse. These may include:  Fatty foods.  Citrus fruits.  Other foods and drinks that contain acid.  Avoid putting pressure on your belly. Anything that puts pressure on your belly increases the amount of acid that may be pushed up into your esophagus.   Avoid bending over, especially after eating.  Raise the head of your bed   by putting blocks under the legs. This keeps your head and esophagus higher than your stomach.  Do not wear tight clothing around your chest or stomach.  Try not to strain when having a bowel movement, when urinating, or when lifting heavy objects. SEEK MEDICAL CARE IF:  Your symptoms are not controlled with medicines or lifestyle changes.  You are having trouble swallowing.  You have coughing or wheezing that will not  go away. SEEK IMMEDIATE MEDICAL CARE IF:  Your pain is getting worse.  Your pain spreads to your arms, neck, jaw, teeth, or back.  You have shortness of breath.  You sweat for no reason.  You feel sick to your stomach (nauseous) or vomit.  You vomit blood.  You have bright red blood in your stools.  You have black, tarry stools.  This information is not intended to replace advice given to you by your health care provider. Make sure you discuss any questions you have with your health care provider. Document Released: 09/25/2003 Document Revised: 10/27/2015 Document Reviewed: 06/22/2013 Elsevier Interactive Patient Education  2017 [place].  Heartburn Introduction Heartburn is a type of pain or discomfort that can happen in the throat or chest. It is often described as a burning pain. It may also cause a bad taste in the mouth. Heartburn may feel worse when you lie down or bend over. It may be caused by stomach contents that move back up (reflux) into the tube that connects the mouth with the stomach (esophagus). Follow these instructions at home: Take these actions to lessen your discomfort and to help avoid problems. Diet  Follow a diet as told by your doctor. You may need to avoid foods and drinks such as:  Coffee and tea (with or without caffeine).  Drinks that contain alcohol.  Energy drinks and sports drinks.  Carbonated drinks or sodas.  Chocolate and cocoa.  Peppermint and mint flavorings.  Garlic and onions.  Horseradish.  Spicy and acidic foods, such as peppers, chili powder, curry powder, vinegar, hot sauces, and BBQ sauce.  Citrus fruit juices and citrus fruits, such as oranges, lemons, and limes.  Tomato-based foods, such as red sauce, chili, salsa, and pizza with red sauce.  Fried and fatty foods, such as donuts, french fries, potato chips, and high-fat dressings.  High-fat meats, such as hot dogs, rib eye steak, sausage, ham, and  bacon.  High-fat dairy items, such as whole milk, butter, and cream cheese.  Eat small meals often. Avoid eating large meals.  Avoid drinking large amounts of liquid with your meals.  Avoid eating meals during the 2-3 hours before bedtime.  Avoid lying down right after you eat.  Do not exercise right after you eat. General instructions  Pay attention to any changes in your symptoms.  Take over-the-counter and prescription medicines only as told by your doctor. Do not take aspirin, ibuprofen, or other NSAIDs unless your doctor says it is okay.  Do not use any tobacco products, including cigarettes, chewing tobacco, and e-cigarettes. If you need help quitting, ask your doctor.  Wear loose clothes. Do not wear anything tight around your waist.  Raise (elevate) the head of your bed about 6 inches (15 cm).  Try to lower your stress. If you need help doing this, ask your doctor.  If you are overweight, lose an amount of weight that is healthy for you. Ask your doctor about a safe weight loss goal.  Keep all follow-up visits as told by your doctor.  This is important. Contact a doctor if:  You have new symptoms.  You lose weight and you do not know why it is happening.  You have trouble swallowing, or it hurts to swallow.  You have wheezing or a cough that keeps happening.  Your symptoms do not get better with treatment.  You have heartburn often for more than two weeks. Get help right away if:  You have pain in your arms, neck, jaw, teeth, or back.  You feel sweaty, dizzy, or light-headed.  You have chest pain or shortness of breath.  You throw up (vomit) and your throw up looks like blood or coffee grounds.  Your poop (stool) is bloody or black. This information is not intended to replace advice given to you by your health care provider. Make sure you discuss any questions you have with your health care provider. Document Released: 03/17/2011 Document Revised:  12/11/2015 Document Reviewed: 10/30/2014  2017 Elsevier  Hiatal Hernia A hiatal hernia occurs when part of your stomach slides above the muscle that separates your abdomen from your chest (diaphragm). You can be born with a hiatal hernia (congenital), or it may develop over time. In almost all cases of hiatal hernia, only the top part of the stomach pushes through.  Many people have a hiatal hernia with no symptoms. The larger the hernia, the more likely that you will have symptoms. In some cases, a hiatal hernia allows stomach acid to flow back into the tube that carries food from your mouth to your stomach (esophagus). This may cause heartburn symptoms. Severe heartburn symptoms may mean you have developed a condition called gastroesophageal reflux disease (GERD).  CAUSES  Hiatal hernias are caused by a weakness in the opening (hiatus) where your esophagus passes through your diaphragm to attach to the upper part of your stomach. You may be born with a weakness in your hiatus, or a weakness can develop. RISK FACTORS Older age is a major risk factor for a hiatal hernia. Anything that increases pressure on your diaphragm can also increase your risk of a hiatal hernia. This includes:  Pregnancy.  Excess weight.  Frequent constipation. SIGNS AND SYMPTOMS  People with a hiatal hernia often have no symptoms. If symptoms develop, they are almost always caused by GERD. They may include:  Heartburn.  Belching.  Indigestion.  Trouble swallowing.  Coughing or wheezing.  Sore throat.  Hoarseness.  Chest pain. DIAGNOSIS  A hiatal hernia is sometimes found during an exam for another problem. Your health care provider may suspect a hiatal hernia if you have symptoms of GERD. Tests may be done to diagnose GERD. These may include:  X-rays of your stomach or chest.  An upper gastrointestinal (GI) series. This is an X-ray exam of your GI tract involving the use of a chalky liquid that you  swallow. The liquid shows up clearly on the X-ray.  Endoscopy. This is a procedure to look into your stomach using a thin, flexible tube that has a tiny camera and light on the end of it. TREATMENT  If you have no symptoms, you may not need treatment. If you have symptoms, treatment may include:  Dietary and lifestyle changes to help reduce GERD symptoms.  Medicines. These may include:  Over-the-counter antacids.  Medicines that make your stomach empty more quickly.  Medicines that block the production of stomach acid (H2 blockers).  Stronger medicines to reduce stomach acid (proton pump inhibitors).  You may need surgery to repair the hernia if  other treatments are not helping. HOME CARE INSTRUCTIONS   Take all medicines as directed by your health care provider.  Quit smoking, if you smoke.  Try to achieve and maintain a healthy body weight.  Eat frequent small meals instead of three large meals a day. This keeps your stomach from getting too full.  Eat slowly.  Do not lie down right after eating.  Do noteat 1-2 hours before bed.   Do not drink beverages with caffeine. These include cola, coffee, cocoa, and tea.  Do not drink alcohol.  Avoid foods that can make symptoms of GERD worse. These may include:  Fatty foods.  Citrus fruits.  Other foods and drinks that contain acid.  Avoid putting pressure on your belly. Anything that puts pressure on your belly increases the amount of acid that may be pushed up into your esophagus.   Avoid bending over, especially after eating.  Raise the head of your bed by putting blocks under the legs. This keeps your head and esophagus higher than your stomach.  Do not wear tight clothing around your chest or stomach.  Try not to strain when having a bowel movement, when urinating, or when lifting heavy objects. SEEK MEDICAL CARE IF:  Your symptoms are not controlled with medicines or lifestyle changes.  You are having  trouble swallowing.  You have coughing or wheezing that will not go away. SEEK IMMEDIATE MEDICAL CARE IF:  Your pain is getting worse.  Your pain spreads to your arms, neck, jaw, teeth, or back.  You have shortness of breath.  You sweat for no reason.  You feel sick to your stomach (nauseous) or vomit.  You vomit blood.  You have bright red blood in your stools.  You have black, tarry stools.  This information is not intended to replace advice given to you by your health care provider. Make sure you discuss any questions you have with your health care provider. Document Released: 09/25/2003 Document Revised: 10/27/2015 Document Reviewed: 06/22/2013 Elsevier Interactive Patient Education  2017 [employer].

## 2016-06-16 DIAGNOSIS — Z7689 Persons encountering health services in other specified circumstances: Secondary | ICD-10-CM

## 2016-06-18 ENCOUNTER — Telehealth: Payer: Self-pay

## 2016-06-18 NOTE — Telephone Encounter (Signed)
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

## 2016-06-18 NOTE — Telephone Encounter (Signed)
Pt has been notified of results at a previous office appt. Duplicate results.

## 2016-06-20 DIAGNOSIS — R140 Abdominal distension (gaseous): Secondary | ICD-10-CM | POA: Diagnosis not present

## 2016-06-20 DIAGNOSIS — R1084 Generalized abdominal pain: Secondary | ICD-10-CM | POA: Diagnosis not present

## 2016-06-20 DIAGNOSIS — R1013 Epigastric pain: Secondary | ICD-10-CM | POA: Diagnosis not present

## 2016-06-20 DIAGNOSIS — K449 Diaphragmatic hernia without obstruction or gangrene: Secondary | ICD-10-CM | POA: Diagnosis not present

## 2016-06-21 DIAGNOSIS — K449 Diaphragmatic hernia without obstruction or gangrene: Secondary | ICD-10-CM | POA: Diagnosis not present

## 2016-06-21 DIAGNOSIS — G629 Polyneuropathy, unspecified: Secondary | ICD-10-CM | POA: Diagnosis not present

## 2016-06-21 DIAGNOSIS — K654 Sclerosing mesenteritis: Secondary | ICD-10-CM | POA: Diagnosis not present

## 2016-06-21 DIAGNOSIS — R109 Unspecified abdominal pain: Secondary | ICD-10-CM | POA: Diagnosis not present

## 2016-06-21 DIAGNOSIS — I10 Essential (primary) hypertension: Secondary | ICD-10-CM | POA: Diagnosis not present

## 2016-06-21 DIAGNOSIS — I447 Left bundle-branch block, unspecified: Secondary | ICD-10-CM | POA: Diagnosis not present

## 2016-06-21 DIAGNOSIS — Z90710 Acquired absence of both cervix and uterus: Secondary | ICD-10-CM | POA: Diagnosis not present

## 2016-06-21 DIAGNOSIS — R1084 Generalized abdominal pain: Secondary | ICD-10-CM | POA: Diagnosis not present

## 2016-06-21 DIAGNOSIS — I880 Nonspecific mesenteric lymphadenitis: Secondary | ICD-10-CM | POA: Diagnosis not present

## 2016-06-21 DIAGNOSIS — R1013 Epigastric pain: Secondary | ICD-10-CM | POA: Diagnosis not present

## 2016-06-21 DIAGNOSIS — R112 Nausea with vomiting, unspecified: Secondary | ICD-10-CM | POA: Diagnosis not present

## 2016-06-21 DIAGNOSIS — Z9049 Acquired absence of other specified parts of digestive tract: Secondary | ICD-10-CM | POA: Diagnosis not present

## 2016-06-21 DIAGNOSIS — R110 Nausea: Secondary | ICD-10-CM | POA: Diagnosis not present

## 2016-06-21 DIAGNOSIS — K219 Gastro-esophageal reflux disease without esophagitis: Secondary | ICD-10-CM | POA: Diagnosis not present

## 2016-06-21 DIAGNOSIS — R140 Abdominal distension (gaseous): Secondary | ICD-10-CM | POA: Diagnosis not present

## 2016-06-21 DIAGNOSIS — R9431 Abnormal electrocardiogram [ECG] [EKG]: Secondary | ICD-10-CM | POA: Diagnosis not present

## 2016-06-21 DIAGNOSIS — Z01818 Encounter for other preprocedural examination: Secondary | ICD-10-CM | POA: Diagnosis not present

## 2016-06-21 DIAGNOSIS — K429 Umbilical hernia without obstruction or gangrene: Secondary | ICD-10-CM | POA: Diagnosis not present

## 2016-06-25 ENCOUNTER — Telehealth: Payer: Self-pay | Admitting: General Surgery

## 2016-06-25 DIAGNOSIS — K654 Sclerosing mesenteritis: Secondary | ICD-10-CM | POA: Insufficient documentation

## 2016-06-25 HISTORY — DX: Sclerosing mesenteritis: K65.4

## 2016-06-25 NOTE — Telephone Encounter (Signed)
Refer patient to [facility] for Consultation for Hiatal Hernia Repair please.  Patient has already been seen by [facility] and had her manometry testing at [facility].

## 2016-06-25 NOTE — Telephone Encounter (Signed)
Returned phone call at this time.   Esophageal Manometry results reviewed. Spoke with Dr. Adonis Huguenin in regards to patient's history of seizures/heart history and he feels that patient would be better treated at a [facility]. Since patient is established with [facility], he would like referral sent to [facility].  Call made to patient at this time. No answer. Left voicemail leaving above information and that referral would be placed today. Asked patient to return phone call with any further questions.

## 2016-06-25 NOTE — Telephone Encounter (Signed)
Patient left a voice message this morning that she had a esophageal procedure at [facility] and would like to speak to Dr. Adonis Huguenin

## 2016-06-28 NOTE — Telephone Encounter (Signed)
A referral has been faxed to [place] for patient to be consulted for a hiatal hernia repair. All clinic notes, demographics, and imaging has been faxed to [phone].  Once the records are reviewed, [facility] will call the patient and set up an appointment.   I will follow up to make sure appointment has been made within 7 business days.

## 2016-06-30 NOTE — Telephone Encounter (Signed)
Patient is scheduled to see [facility]< Dr Katherina Mires on 07/06/16.   Patient was advised to call us if she needs any further assistance in the future.

## 2016-07-06 DIAGNOSIS — K449 Diaphragmatic hernia without obstruction or gangrene: Secondary | ICD-10-CM | POA: Diagnosis not present

## 2016-07-07 DIAGNOSIS — K219 Gastro-esophageal reflux disease without esophagitis: Secondary | ICD-10-CM | POA: Diagnosis not present

## 2016-07-09 DIAGNOSIS — K219 Gastro-esophageal reflux disease without esophagitis: Secondary | ICD-10-CM | POA: Diagnosis not present

## 2016-07-20 ENCOUNTER — Other Ambulatory Visit: Payer: Self-pay | Admitting: Internal Medicine

## 2016-07-20 NOTE — Telephone Encounter (Signed)
Last filled 06/02/16. Last OV 05/28/16. Has no follow up appt scheduled.

## 2016-07-30 ENCOUNTER — Other Ambulatory Visit: Payer: Self-pay | Admitting: Internal Medicine

## 2016-07-30 NOTE — Telephone Encounter (Signed)
Last seen 05/28/16 hospital follow up Nov scheduled

## 2016-08-03 DIAGNOSIS — R140 Abdominal distension (gaseous): Secondary | ICD-10-CM | POA: Diagnosis not present

## 2016-08-03 DIAGNOSIS — Z79899 Other long term (current) drug therapy: Secondary | ICD-10-CM | POA: Diagnosis not present

## 2016-08-03 DIAGNOSIS — K219 Gastro-esophageal reflux disease without esophagitis: Secondary | ICD-10-CM | POA: Diagnosis not present

## 2016-08-03 DIAGNOSIS — K449 Diaphragmatic hernia without obstruction or gangrene: Secondary | ICD-10-CM | POA: Diagnosis not present

## 2016-08-03 DIAGNOSIS — R197 Diarrhea, unspecified: Secondary | ICD-10-CM | POA: Diagnosis not present

## 2016-08-03 DIAGNOSIS — K654 Sclerosing mesenteritis: Secondary | ICD-10-CM | POA: Diagnosis not present

## 2016-08-12 ENCOUNTER — Other Ambulatory Visit: Payer: Self-pay | Admitting: Internal Medicine

## 2016-08-16 ENCOUNTER — Telehealth: Payer: Self-pay | Admitting: Internal Medicine

## 2016-08-16 MED ORDER — PANTOPRAZOLE SODIUM 40 MG PO TBEC: 40.0000 mg | DELAYED_RELEASE_TABLET | Freq: Every day | ORAL | 1 refills | Status: DC

## 2016-08-16 NOTE — Telephone Encounter (Signed)
Rx sent Need OV for further refills

## 2016-08-16 NOTE — Telephone Encounter (Signed)
pantoprazole (PROTONIX) 40 MG tablet(Expired) take 1 by mouth daily.

## 2016-08-31 DIAGNOSIS — H524 Presbyopia: Secondary | ICD-10-CM | POA: Diagnosis not present

## 2016-08-31 DIAGNOSIS — H52223 Regular astigmatism, bilateral: Secondary | ICD-10-CM | POA: Diagnosis not present

## 2016-08-31 DIAGNOSIS — H5213 Myopia, bilateral: Secondary | ICD-10-CM | POA: Diagnosis not present

## 2016-09-22 DIAGNOSIS — M79672 Pain in left foot: Secondary | ICD-10-CM | POA: Diagnosis not present

## 2016-09-22 DIAGNOSIS — M722 Plantar fascial fibromatosis: Secondary | ICD-10-CM | POA: Diagnosis not present

## 2016-09-22 DIAGNOSIS — M79671 Pain in right foot: Secondary | ICD-10-CM | POA: Diagnosis not present

## 2016-10-08 ENCOUNTER — Other Ambulatory Visit: Payer: Self-pay | Admitting: Internal Medicine

## 2016-10-08 ENCOUNTER — Telehealth: Payer: Self-pay | Admitting: Internal Medicine

## 2016-10-08 MED ORDER — PANTOPRAZOLE SODIUM 40 MG PO TBEC: 40.0000 mg | DELAYED_RELEASE_TABLET | Freq: Every day | ORAL | 1 refills | Status: DC

## 2016-10-08 NOTE — Telephone Encounter (Signed)
Pt called wanting to know if she can get another refill for 30 days until she starts her new job and the insurance will be active? pantoprazole (PROTONIX) 40 MG tablet(Expired)  Pharmacy is [facility], [address]  Call pt @ [phone]. Thank you!

## 2016-10-08 NOTE — Telephone Encounter (Signed)
Refill for 30 days only.  OFFICE VISIT NEEDED prior to any more refills. PLEASE CALL PATIENT

## 2016-10-08 NOTE — Telephone Encounter (Signed)
appt has been scheduled for Monday the 26th at 6:00pm. Rx has been signed and faxed.

## 2016-10-08 NOTE — Telephone Encounter (Signed)
Alprazolam    Refilled 07/20/2016  Tegretol         Refilled 06/04/2016  Last OV: 05/10/2016 Next OV: not scheduled.

## 2016-10-08 NOTE — Telephone Encounter (Signed)
Please advise.   Refilled 08/16/2016 Last OV: 05/28/2016 Next OV: not scheduled.

## 2016-10-08 NOTE — Telephone Encounter (Signed)
Spoke with pt and informed her that Dr. Derrel Nip sent in a refill of medication for her.

## 2016-10-08 NOTE — Telephone Encounter (Signed)
Yes,  Refills given.   I'm  Not sure why somebody tacked on "APPT NEEDED FO R MORE REFILLS" ,  SINCE SHE WAS SEEN IN November,  IT HAS NOT BEEN 6 MONTHS

## 2016-10-11 ENCOUNTER — Telehealth: Payer: Self-pay | Admitting: *Deleted

## 2016-10-11 ENCOUNTER — Ambulatory Visit: Payer: Self-pay | Admitting: Internal Medicine

## 2016-10-11 NOTE — Telephone Encounter (Signed)
Please give a time and date to place patient for a appointment for a medication refill Contact Pt's mother Vaughan Basta [phone]

## 2016-10-12 NOTE — Telephone Encounter (Signed)
Called pt's mother as stated below, pt's mother said to call pt since she was at home today. Attempted to call pt. LMCTB.

## 2016-10-12 NOTE — Telephone Encounter (Signed)
Would it be ok to schedule pt in one of your 11/08/2016 afternoon slots for a 6 month follow up/medication refill?

## 2016-10-12 NOTE — Telephone Encounter (Signed)
Yes

## 2016-10-14 NOTE — Telephone Encounter (Signed)
Left message for mom regarding appointment to see which time she could come in .

## 2016-10-14 NOTE — Telephone Encounter (Signed)
Left message to call for [facility]

## 2016-10-14 NOTE — Telephone Encounter (Signed)
Juliann Pulse can you help it looks like Dr Lupita Dawn schedule is full on this day?

## 2016-10-14 NOTE — Telephone Encounter (Signed)
That is a Monday night can do 3 :30 or 4:30 let me know which to schedule  Patient in.

## 2016-10-14 NOTE — Telephone Encounter (Signed)
Please return a call mother Mary Bryant  Pt contact [phone]

## 2016-12-06 ENCOUNTER — Telehealth: Payer: Self-pay | Admitting: *Deleted

## 2016-12-06 DIAGNOSIS — G500 Trigeminal neuralgia: Secondary | ICD-10-CM

## 2016-12-06 NOTE — Telephone Encounter (Signed)
Referral to [facility] in [facility]. For recurrent trigeminal neuralgia . See below

## 2016-12-06 NOTE — Telephone Encounter (Signed)
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 The pt stated that all of this started getting worse on Friday. She stated that she takes the carbamazepine for this but it doesn't seem to be helping as well anymore. The pt's mother called [facility] this morning and they stated that they could see her on June 10th they just needed a referral from [place] sent over. Please advise.

## 2016-12-06 NOTE — Telephone Encounter (Signed)
Pt's mother has requested to have pt sent to [facility]. Patient is having issues with her neuropathy , mother has concerns that daughter has worsen.  [employer]  [phone]

## 2016-12-21 ENCOUNTER — Emergency Department: Payer: Self-pay

## 2016-12-21 ENCOUNTER — Encounter: Payer: Self-pay | Admitting: Emergency Medicine

## 2016-12-21 ENCOUNTER — Emergency Department
Admission: EM | Admit: 2016-12-21 | Discharge: 2016-12-21 | Disposition: A | Discharge status: Home / Self Care | Payer: Self-pay | Attending: Emergency Medicine | Admitting: Emergency Medicine

## 2016-12-21 DIAGNOSIS — R509 Fever, unspecified: Secondary | ICD-10-CM | POA: Insufficient documentation

## 2016-12-21 DIAGNOSIS — T675XXA Heat exhaustion, unspecified, initial encounter: Secondary | ICD-10-CM

## 2016-12-21 DIAGNOSIS — T673XXA Heat exhaustion, anhydrotic, initial encounter: Secondary | ICD-10-CM | POA: Insufficient documentation

## 2016-12-21 LAB — URINALYSIS, COMPLETE (UACMP) WITH MICROSCOPIC
BACTERIA UA: NONE SEEN
Bilirubin Urine: NEGATIVE
Glucose, UA: NEGATIVE mg/dL
HGB URINE DIPSTICK: NEGATIVE
Ketones, ur: NEGATIVE mg/dL
Leukocytes, UA: NEGATIVE
NITRITE: NEGATIVE
Protein, ur: NEGATIVE mg/dL
SPECIFIC GRAVITY, URINE: 1.02 (ref 1.005–1.030)
pH: 8 (ref 5.0–8.0)

## 2016-12-21 LAB — CBC
HCT: 39.8 % (ref 35.0–47.0)
Hemoglobin: 13.4 g/dL (ref 12.0–16.0)
MCH: 29.7 pg (ref 26.0–34.0)
MCHC: 33.6 g/dL (ref 32.0–36.0)
MCV: 88.6 fL (ref 80.0–100.0)
Platelets: 344 10*3/uL (ref 150–440)
RBC: 4.5 MIL/uL (ref 3.80–5.20)
RDW: 14.1 % (ref 11.5–14.5)
WBC: 17 10*3/uL — ABNORMAL HIGH (ref 3.6–11.0)

## 2016-12-21 LAB — COMPREHENSIVE METABOLIC PANEL
ALBUMIN: 4.3 g/dL (ref 3.5–5.0)
ALK PHOS: 88 U/L (ref 38–126)
ALT: 19 U/L (ref 14–54)
AST: 25 U/L (ref 15–41)
Anion gap: 9 (ref 5–15)
BILIRUBIN TOTAL: 0.6 mg/dL (ref 0.3–1.2)
BUN: 12 mg/dL (ref 6–20)
CALCIUM: 9.5 mg/dL (ref 8.9–10.3)
CO2: 26 mmol/L (ref 22–32)
Chloride: 106 mmol/L (ref 101–111)
Creatinine, Ser: 0.86 mg/dL (ref 0.44–1.00)
GFR calc Af Amer: >60 mL/min (ref >60)
GFR calc non Af Amer: >60 mL/min (ref >60)
Glucose, Bld: 117 mg/dL — ABNORMAL HIGH (ref 65–99)
Potassium: 4.5 mmol/L (ref 3.5–5.1)
Sodium: 141 mmol/L (ref 135–145)
TOTAL PROTEIN: 7.8 g/dL (ref 6.5–8.1)

## 2016-12-21 LAB — LIPASE, BLOOD: Lipase: 33 U/L (ref 11–51)

## 2016-12-21 MED ORDER — SODIUM CHLORIDE 0.9 % IV SOLN
1000.0000 mL | Freq: Once | INTRAVENOUS | Status: AC
  Administered 2016-12-21: 1000 mL via INTRAVENOUS

## 2016-12-21 MED ORDER — KETOROLAC TROMETHAMINE 30 MG/ML IJ SOLN
30.0000 mg | Freq: Once | INTRAMUSCULAR | Status: AC
  Administered 2016-12-21: 30 mg via INTRAVENOUS
  Filled 2016-12-21: qty 1

## 2016-12-21 MED ORDER — ONDANSETRON HCL 4 MG/2ML IJ SOLN
4.0000 mg | Freq: Once | INTRAMUSCULAR | Status: AC
  Administered 2016-12-21: 4 mg via INTRAVENOUS
  Filled 2016-12-21: qty 2

## 2016-12-21 NOTE — ED Provider Notes (Signed)
[facility] Emergency Department Provider Note   ____________________________________________    I have reviewed the triage vital signs and the nursing notes.   HISTORY  Chief Complaint Emesis and Fever     HPI Mary Bryant is a 51 y.o. female who presents with complaints of nausea and dizziness. Patient reports her air conditioning has been broken for the last several days. 2 days ago she noted the lawn and got somewhat sunburned but felt okay until after she went to the house and started to feel quite hot and nauseous and dizzy. She reports a mild cough. No dysuria. She reports a history of a left bundle branch block but no other medical problems.   Past Medical History:  Diagnosis Date  . Esophageal stricture    dilated 2009,  [place]  . GERD (gastroesophageal reflux disease)   . History of hiatal hernia   . Hypertension    no prior treatment  . Insomnia    chronic, no prior sleep study  . Left bundle branch block   . PONV (postoperative nausea and vomiting)   . rhinitis allergic   . Wears contact lenses   . Wears hearing aid    bilateral    Patient Active Problem List   Diagnosis Date Noted  . Hospital discharge follow-up 05/30/2016  . Vomiting 05/20/2016  . Musculoskeletal pain 05/17/2016  . Helicobacter pylori gastritis 05/12/2016  . Abdominal pain, epigastric   . Hiatal hernia   . Abdominal pain, right upper quadrant   . Encounter for preventive health examination 05/10/2015  . Menopausal hot flushes 05/10/2015  . S/P total hysterectomy 05/08/2015  . RLQ abdominal pain 04/04/2015  . 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 Long-term use of high-risk medication 04/21/2014  . Trigeminal neuralgia of left side of face 03/22/2014  . 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 Occasional tremors 01/07/2014  . Chronic headaches 11/21/2013  . Anxiety 10/25/2013  . Hiatal hernia with GERD and esophagitis  10/25/2013  . H pylori ulcer 10/25/2013  . History of breast lump/mass excision 08/08/2013  . Vitamin D deficiency 06/10/2013  . Generalized anxiety disorder 06/08/2013  . Screening for breast cancer 06/08/2013  . CAD in native artery 06/08/2013  . H/O left bundle branch block 06/08/2013  . Chest pain 04/04/2013  . Overweight (BMI 25.0-29.9) 01/17/2012  . Insomnia   . Esophageal stricture     Past Surgical History:  Procedure Laterality Date  . ABDOMINAL HYSTERECTOMY  2000   no history of ca   . CHOLECYSTECTOMY  2008  . ESOPHAGOGASTRODUODENOSCOPY (EGD) WITH PROPOFOL N/A 05/12/2016   Procedure: ESOPHAGOGASTRODUODENOSCOPY (EGD) WITH PROPOFOL;  Surgeon: Jonathon Bellows, MD;  Location: [place] [facility];  Service: Endoscopy;  Laterality: N/A;    Prior to Admission medications   Medication Sig Start Date End Date Taking? Authorizing Provider  acetaminophen (TYLENOL) 500 MG tablet Take 1,000 mg by mouth every 6 (six) hours as needed for mild pain.   Yes [provider]  ALPRAZolam (XANAX) 0.5 MG tablet Take 1 tablet (0.5 mg total) by mouth at bedtime as needed for anxiety or sleep. as directed 10/08/16  Yes Crecencio Mc, MD  carbamazepine (TEGRETOL) 200 MG tablet TAKE 1 TABLET BY MOUTH 3 TIMES DAILY 10/08/16  Yes Crecencio Mc, MD  pantoprazole (PROTONIX) 40 MG tablet Take 1 tablet (40 mg total) by mouth daily. 10/08/16 12/21/17 Yes Crecencio Mc, MD  acyclovir ointment (ZOVIRAX) 5 % Apply  1 application topically 2 (two) times daily. As needed for fever blisters Patient not taking: Reported on 12/21/2016 05/28/16   Crecencio Mc, MD  ergocalciferol (VITAMIN D2) 50000 units capsule Take 1 capsule (50,000 Units total) by mouth once a week. Patient not taking: Reported on 12/21/2016 05/12/16   Crecencio Mc, MD  escitalopram (LEXAPRO) 10 MG tablet TAKE 1 TABLET BY MOUTH ONCE DAILY Patient not taking: Reported on 12/21/2016 07/30/16   Crecencio Mc, MD  metoprolol succinate  (TOPROL-XL) 25 MG 24 hr tablet TAKE 1 TABLET BY MOUTH ONCE DAILY Patient not taking: Reported on 12/21/2016 10/08/16   Crecencio Mc, MD  ondansetron (ZOFRAN) 4 MG tablet Take 1 tablet (4 mg total) by mouth every 6 (six) hours as needed for nausea. Patient not taking: Reported on 12/21/2016 05/21/16   Hower, Aaron Mose, MD  sucralfate (CARAFATE) 1 GM/10ML suspension Take 10 mLs (1 g total) by mouth 4 (four) times daily -  with meals and at bedtime. Patient not taking: Reported on 12/21/2016 06/09/16   Jonathon Bellows, MD     Allergies Promethazine and Benadryl [diphenhydramine hcl]  Family History  Problem Relation Age of Onset  . Cancer Father        Bladder,  in Hospice  . Diabetes type II Father   . COPD Father   . Coronary artery disease Father   . Heart disease Father 15  . Diabetes Mother   . Hyperlipidemia Mother   . Hypertension Mother   . Early death Paternal Grandmother   . Heart disease Paternal Grandmother        CAD    Social History Social History  Substance Use Topics  . Smoking status: Never Smoker  . Smokeless tobacco: Never Used  . Alcohol use No    Review of Systems  Constitutional:Subjective fevers Eyes: No visual changes.  ENT: No sore throat. Cardiovascular: Denies chest pain.Positive dizziness Respiratory: Denies shortness of breath. Gastrointestinal: No abdominal pain.  No nausea, no vomiting.   Genitourinary: Negative for dysuria. Musculoskeletal: Negative for back pain. Skin: Negative for rash. Neurological: Positive for mild headache   ____________________________________________   PHYSICAL EXAM:  VITAL SIGNS: ED Triage Vitals  Enc Vitals Group     BP 12/21/16 1026 (!) 143/92     Pulse Rate 12/21/16 1026 (!) 115     Resp 12/21/16 1026 20     Temp 12/21/16 1026 99.8 F (37.7 C)     Temp Source 12/21/16 1026 Oral     SpO2 12/21/16 1026 94 %     Weight 12/21/16 1027 81.6 kg (180 lb)     Height 12/21/16 1027 1.727 m (5\' 8" )     Head  Circumference --      Peak Flow --      Pain Score 12/21/16 1028 10     Pain Loc --      Pain Edu? --      Excl. in [place]? --     Constitutional: Alert and oriented. No acute distress. Pleasant and interactive Eyes: Conjunctivae are normal.  Head: Atraumatic. Nose: No congestion/rhinnorhea. Mouth/Throat: Mucous membranes are moist.   Neck:  Painless ROM Cardiovascular:Tachycardia regular rhythm. Grossly normal heart sounds.  Good peripheral circulation. Respiratory: Normal respiratory effort.  No retractions. Lungs CTAB. Gastrointestinal: Soft and nontender. No distention.  No CVA tenderness. Genitourinary: deferred Musculoskeletal: No lower extremity tenderness nor edema.  Warm and well perfused Neurologic:  Normal speech and language. No gross focal neurologic deficits are  appreciated.  Skin:  Skin is warm, dry and intact. Sunburn to the upper back bilaterally, mild Psychiatric: Mood and affect are normal. Speech and behavior are normal.  ____________________________________________   LABS (all labs ordered are listed, but only abnormal results are displayed)  Labs Reviewed  COMPREHENSIVE METABOLIC PANEL - Abnormal; Notable for the following:       Result Value   Glucose, Bld 117 (*)    All other components within normal limits  CBC - Abnormal; Notable for the following:    WBC 17.0 (*)    All other components within normal limits  URINALYSIS, COMPLETE (UACMP) WITH MICROSCOPIC - Abnormal; Notable for the following:    Color, Urine YELLOW (*)    APPearance CLEAR (*)    Squamous Epithelial / LPF 0-5 (*)    All other components within normal limits  LIPASE, BLOOD   ____________________________________________  EKG  ED ECG REPORT I, Lavonia Drafts, the attending physician, personally viewed and interpreted this ECG.  Date: 12/21/2016  Rate: 104 Rhythm: Sinus tachycardia QRS Axis: normal Intervals: normal ST/T Wave abnormalities: normal Conduction Disturbances: Left  bundle branch block Narrative Interpretation: unremarkable   ____________________________________________  RADIOLOGY  Chest x-ray pending ____________________________________________   PROCEDURES  Procedure(s) performed: No    Critical Care performed: No ____________________________________________   INITIAL IMPRESSION / ASSESSMENT AND PLAN / ED COURSE  Pertinent labs & imaging results that were available during my care of the patient were reviewed by me and considered in my medical decision making (see chart for details).  Patient presents with tachycardia, elevated temperature and reports of not having air-conditioning during a very hot week. Certainly heat exhaustion is on the differential, we will also check chest x-ray urinalysis to rule out infection. We will give IV fluids and Zofran and reevaluate.  ----------------------------------------- 2:50 PM on 12/21/2016 -----------------------------------------  Patient has received about 600 cc of fluid, she is feeling significantly better. Her headache is improving. Lab work is overall reassuring  ----------------------------------------- 3:40 PM on 12/21/2016 -----------------------------------------  Patient reports she feels significantly better. Heart rate is improved. Headache is better. Appropriate for discharge at this time. Recommend rest and increase fluid intake    ____________________________________________   FINAL CLINICAL IMPRESSION(S) / ED DIAGNOSES  Final diagnoses:  Heat exhaustion, initial encounter      NEW MEDICATIONS STARTED DURING THIS VISIT:  New Prescriptions   No medications on file     Note:  This document was prepared using Dragon voice recognition software and may include unintentional dictation errors.    Lavonia Drafts, MD 12/21/16 [phone]

## 2016-12-21 NOTE — ED Notes (Signed)
Family member states patient is prone to "passing out" and needs to lie down.  Assisted to Sub wait and into recliner.  Alert and oriented.  Complaining of headache.

## 2016-12-21 NOTE — ED Notes (Signed)
Patient to Room 10.  Charleen Kirks RN in room.

## 2016-12-21 NOTE — ED Triage Notes (Signed)
Pt with vomiting and fever, headache started yesterday. Pt states her a/c went out at home and has been working out in the yard and thinks she may be dehydrated.

## 2016-12-21 NOTE — ED Notes (Signed)
Pt stating that she has been without her A/C for four days. Pt was mowing her grass yesterday and believes she might have become dehydrated or "over-heated." Pt stating that she started with a HA and n/v after going inside and felt like she could not cool down. Pt has sunburn to her shoulders noted. Pt stating that she has been unable to keep fluids. Pt in NAD at this time and is resting with eyes closed. Pt stating HA and weakness are what are bothering her. Pt placed on monitor and is in gown.

## 2017-01-03 ENCOUNTER — Ambulatory Visit: Payer: Commercial Managed Care - PPO | Admitting: Neurology

## 2017-01-22 ENCOUNTER — Other Ambulatory Visit: Payer: Self-pay | Admitting: Internal Medicine

## 2017-02-14 ENCOUNTER — Ambulatory Visit (INDEPENDENT_AMBULATORY_CARE_PROVIDER_SITE_OTHER): Payer: BLUE CROSS/BLUE SHIELD | Admitting: Neurology

## 2017-02-14 ENCOUNTER — Encounter: Payer: Self-pay | Admitting: Neurology

## 2017-02-14 DIAGNOSIS — R569 Unspecified convulsions: Secondary | ICD-10-CM

## 2017-02-14 DIAGNOSIS — R51 Headache: Secondary | ICD-10-CM

## 2017-02-14 DIAGNOSIS — R519 Headache, unspecified: Secondary | ICD-10-CM | POA: Insufficient documentation

## 2017-02-14 MED ORDER — OXCARBAZEPINE 150 MG PO TABS: 150.0000 mg | ORAL_TABLET | Freq: Two times a day (BID) | ORAL | 11 refills | Status: DC

## 2017-02-14 MED ORDER — GABAPENTIN 300 MG PO CAPS: 300.0000 mg | ORAL_CAPSULE | Freq: Three times a day (TID) | ORAL | 11 refills | Status: DC

## 2017-02-14 NOTE — Progress Notes (Signed)
PATIENT: Mary Bryant DOB: 13-Mar-1966  Chief Complaint  Patient presents with  . Trigeminal Neuralgia    She is here with her mother, Vaughan Basta. She is here for recent worsening of her left-sided facial pain.  She has increased her carbamazepine to 400mg , BID with some relief. Symptoms first presented three years ago.  Marland Kitchen PCP    Crecencio Mc, MD     HISTORICAL  Mary Bryant 51 years old right-handed female, seen in refer by her primary care doctor  Crecencio Mc, for facial nerve pain, initial evaluation was on February 14 2017.  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, at the same time she had recurrent episode of pudding sensation that her left face, feeling flushed, feverish, initially the episode was intermittent, since January 2018, she had recurrent episode of almost daily basis, she complains of left facial pulling sensation, spreading to left temporal, and also left front teeth, was seen by dentist there was no significant improvement,  Because of her pseudoseizures spells, set onset difficulty talking, whole-body shaking, lasting for a few hours, she has been treated with Tegretol 400 mg twice a day, which has helped her symptoms Somes, but in the middle of the day, she still has recurrent episode of left facial discomfort, she works at [employer], art, on the phone all day time, also complains of bilateral hearing loss.  I personally reviewed CT head without contrast in May 2016 that was normal, laboratory evaluations in June 2018 showed elevated CBC 17, normal CMP with exception of mild elevated glucose 117,   REVIEW OF SYSTEMS: Full 14 system review of systems performed and notable only for weight gain, weight loss, fatigue, hearing loss, blurry vision, feeling hot, cold, achy muscles, allergy, runny nose, memory loss, confusion, headache, numbness, difficulty swallowing, dizziness, seizure, passing out, tremor, insomnia, restless leg, depression, anxiety,  decreased energy  ALLERGIES: Allergies  Allergen Reactions  . Promethazine Anaphylaxis  . Benadryl [Diphenhydramine Hcl] Hives    HOME MEDICATIONS: Current Outpatient Prescriptions  Medication Sig Dispense Refill  . acetaminophen (TYLENOL) 500 MG tablet Take 1,000 mg by mouth every 6 (six) hours as needed for mild pain.    Marland Kitchen acyclovir ointment (ZOVIRAX) 5 % Apply 1 application topically 2 (two) times daily. As needed for fever blisters 14 g 3  . carbamazepine (TEGRETOL) 200 MG tablet TAKE 1 TABLET BY MOUTH 3 TIMES DAILY (Patient taking differently: Reports taking 2 tablets twice daily.) 270 tablet 0  . ondansetron (ZOFRAN) 4 MG tablet Take 1 tablet (4 mg total) by mouth every 6 (six) hours as needed for nausea. 20 tablet 0  . pantoprazole (PROTONIX) 40 MG tablet TAKE 1 TABLET BY MOUTH DAILY (Patient taking differently: REPORTS TAKING 2 TABLET BY MOUTH DAILY) 30 tablet 1  . sucralfate (CARAFATE) 1 GM/10ML suspension Take 10 mLs (1 g total) by mouth 4 (four) times daily -  with meals and at bedtime. 420 mL 0   No current facility-administered medications for this visit.     PAST MEDICAL HISTORY: Past Medical History:  Diagnosis Date  . Esophageal stricture    dilated 2009,  [place]  . GERD (gastroesophageal reflux disease)   . History of hiatal hernia   . Hypertension    no prior treatment  . Insomnia    chronic, no prior sleep study  . Left bundle branch block   . PONV (postoperative nausea and vomiting)   . rhinitis allergic   . Trigeminal neuralgia   .  Wears contact lenses   . Wears hearing aid    bilateral    PAST SURGICAL HISTORY: Past Surgical History:  Procedure Laterality Date  . ABDOMINAL HYSTERECTOMY  2000   no history of ca   . CHOLECYSTECTOMY  2008  . ESOPHAGOGASTRODUODENOSCOPY (EGD) WITH PROPOFOL N/A 05/12/2016   Procedure: ESOPHAGOGASTRODUODENOSCOPY (EGD) WITH PROPOFOL;  Surgeon: Jonathon Bellows, MD;  Location: [place];  Service: Endoscopy;   Laterality: N/A;    FAMILY HISTORY: Family History  Problem Relation Age of Onset  . Cancer Father        Bladder,  in Hospice  . Diabetes type II Father   . COPD Father   . Coronary artery disease Father   . Heart disease Father 2  . Diabetes Mother   . Hyperlipidemia Mother   . Hypertension Mother   . Early death Paternal Grandmother   . Heart disease Paternal Grandmother        CAD  . Prostate cancer Paternal Grandfather     SOCIAL HISTORY:  Social History   Social History  . Marital status: Single    Spouse name: Gerilyn Stargell  . Number of children: 3  . Years of education: 14   Occupational History  . Collections    Social History Main Topics  . Smoking status: Never Smoker  . Smokeless tobacco: Never Used  . Alcohol use No  . Drug use: No  . Sexual activity: Not Currently   Other Topics Concern  . Not on file   Social History Narrative   Lives at home alone.   Right-handed.   4 cups caffeine per day.     PHYSICAL EXAM   Vitals:   02/14/17 0946  BP: (!) 148/99  Pulse: 95  Weight: 206 lb 12 oz (93.8 kg)  Height: 5\' 8"  (1.727 m)    Not recorded      Body mass index is 31.44 kg/m.  PHYSICAL EXAMNIATION:  Gen: NAD, conversant, well nourised, obese, well groomed                     Cardiovascular: Regular rate rhythm, no peripheral edema, warm, nontender. Eyes: Conjunctivae clear without exudates or hemorrhage Neck: Supple, no carotid bruits. Pulmonary: Clear to auscultation bilaterally   NEUROLOGICAL EXAM:  MENTAL STATUS: Speech:    Speech is normal; fluent and spontaneous with normal comprehension.  Cognition:     Orientation to time, place and person     Normal recent and remote memory     Normal Attention span and concentration     Normal Language, naming, repeating,spontaneous speech     Fund of knowledge   CRANIAL NERVES: CN II: Visual fields are full to confrontation. Fundoscopic exam is normal with sharp discs and no  vascular changes. Pupils are round equal and briskly reactive to light. CN III, IV, VI: extraocular movement are normal. No ptosis. CN V: Facial sensation is intact to pinprick in all 3 divisions bilaterally. Corneal responses are intact.  CN VII: Face is symmetric with normal eye closure and smile. CN VIII: Hearing is normal to rubbing fingers CN IX, X: Palate elevates symmetrically. Phonation is normal. CN XI: Head turning and shoulder shrug are intact CN XII: Tongue is midline with normal movements and no atrophy.  MOTOR: There is no pronator drift of out-stretched arms. Muscle bulk and tone are normal. Muscle strength is normal.  REFLEXES: Reflexes are 2+ and symmetric at the biceps, triceps, knees, and ankles. Plantar responses  are flexor.  SENSORY: Intact to light touch, pinprick, positional sensation and vibratory sensation are intact in fingers and toes.  COORDINATION: Rapid alternating movements and fine finger movements are intact. There is no dysmetria on finger-to-nose and heel-knee-shin.    GAIT/STANCE: Posture is normal. Gait is steady with normal steps, base, arm swing, and turning. Heel and toe walking are normal. Tandem gait is normal.  Romberg is absent.   DIAGNOSTIC DATA (LABS, IMAGING, TESTING) - I reviewed patient records, labs, notes, testing and imaging myself where available.   ASSESSMENT AND PLAN  RAEA MAGALLON is a 51 y.o. female   Seizure-like event Left facial discomfort  MRI of the brain with and without contrast,  EEG  Laboratory evaluations   Marcial Pacas, M.D. Ph.D.  [facility] [address] [place], [place] [address] Ph: [phone] Fax: [phone]  [id number], Aris Everts, MD

## 2017-02-15 ENCOUNTER — Encounter: Payer: Self-pay | Admitting: *Deleted

## 2017-02-15 LAB — CARBAMAZEPINE LEVEL, TOTAL: CARBAMAZEPINE LVL: 12.4 ug/mL — AB (ref 4.0–12.0)

## 2017-02-15 LAB — CBC WITH DIFFERENTIAL/PLATELET
BASOS ABS: 0.1 10*3/uL (ref 0.0–0.2)
Basos: 1 %
EOS (ABSOLUTE): 0.2 10*3/uL (ref 0.0–0.4)
Eos: 3 %
HEMOGLOBIN: 13.7 g/dL (ref 11.1–15.9)
Hematocrit: 41.2 % (ref 34.0–46.6)
IMMATURE GRANS (ABS): 0 10*3/uL (ref 0.0–0.1)
IMMATURE GRANULOCYTES: 0 %
LYMPHS: 28 %
Lymphocytes Absolute: 1.6 10*3/uL (ref 0.7–3.1)
MCH: 29.5 pg (ref 26.6–33.0)
MCHC: 33.3 g/dL (ref 31.5–35.7)
MCV: 89 fL (ref 79–97)
MONOCYTES: 10 %
Monocytes Absolute: 0.5 10*3/uL (ref 0.1–0.9)
NEUTROS ABS: 3.3 10*3/uL (ref 1.4–7.0)
NEUTROS PCT: 58 %
PLATELETS: 371 10*3/uL (ref 150–379)
RBC: 4.65 x10E6/uL (ref 3.77–5.28)
RDW: 14.5 % (ref 12.3–15.4)
WBC: 5.6 10*3/uL (ref 3.4–10.8)

## 2017-02-15 LAB — RPR: RPR: NONREACTIVE

## 2017-02-15 LAB — TSH: TSH: 2.68 u[IU]/mL (ref 0.450–4.500)

## 2017-02-15 LAB — VITAMIN B12: VITAMIN B 12: 231 pg/mL — AB (ref 232–1245)

## 2017-02-15 LAB — ANA W/REFLEX: ANA: NEGATIVE

## 2017-02-23 ENCOUNTER — Ambulatory Visit (INDEPENDENT_AMBULATORY_CARE_PROVIDER_SITE_OTHER): Payer: BLUE CROSS/BLUE SHIELD

## 2017-02-23 DIAGNOSIS — R51 Headache: Secondary | ICD-10-CM

## 2017-02-23 DIAGNOSIS — R569 Unspecified convulsions: Secondary | ICD-10-CM

## 2017-02-23 DIAGNOSIS — R519 Headache, unspecified: Secondary | ICD-10-CM

## 2017-02-23 MED ORDER — GADOPENTETATE DIMEGLUMINE 469.01 MG/ML IV SOLN: 20.0000 mL | Freq: Once | INTRAVENOUS | Status: DC | PRN

## 2017-02-26 ENCOUNTER — Other Ambulatory Visit: Payer: Self-pay | Admitting: Internal Medicine

## 2017-02-28 ENCOUNTER — Ambulatory Visit (INDEPENDENT_AMBULATORY_CARE_PROVIDER_SITE_OTHER): Payer: BLUE CROSS/BLUE SHIELD | Admitting: Neurology

## 2017-02-28 DIAGNOSIS — R569 Unspecified convulsions: Secondary | ICD-10-CM | POA: Diagnosis not present

## 2017-02-28 DIAGNOSIS — R51 Headache: Principal | ICD-10-CM

## 2017-02-28 DIAGNOSIS — R519 Headache, unspecified: Secondary | ICD-10-CM

## 2017-03-07 ENCOUNTER — Encounter: Payer: Self-pay | Admitting: Internal Medicine

## 2017-03-07 ENCOUNTER — Ambulatory Visit (INDEPENDENT_AMBULATORY_CARE_PROVIDER_SITE_OTHER): Payer: BLUE CROSS/BLUE SHIELD | Admitting: Internal Medicine

## 2017-03-07 VITALS — BP 130/96 | HR 102 | Temp 98.4°F | Resp 16 | Ht 68.0 in | Wt 206.2 lb

## 2017-03-07 DIAGNOSIS — E663 Overweight: Secondary | ICD-10-CM | POA: Diagnosis not present

## 2017-03-07 DIAGNOSIS — Z1231 Encounter for screening mammogram for malignant neoplasm of breast: Secondary | ICD-10-CM

## 2017-03-07 DIAGNOSIS — Z1239 Encounter for other screening for malignant neoplasm of breast: Secondary | ICD-10-CM

## 2017-03-07 NOTE — Progress Notes (Addendum)
Subjective:  Patient ID: Mary Bryant, female    DOB: Sep 22, 1965  Age: 51 y.o. MRN: [id number]  CC: The primary encounter diagnosis was Screening breast examination. A diagnosis of Overweight (BMI 25.0-29.9) was also pertinent to this visit.  HPI [name] presents for  Annual CPE but lastone was May 10 2016.    2) management of obesity  Patient states that she "doesn't eat a lot" and avoids eating because of the weight gain  (26 lbs since June ED visit for dehydration secondary to heatstroke )     Diet:  Eats 1.5 times per day   Facial Neuropathy worse ,  Started having pseudoseizures. EEG [facility]done [facility] .  Lorazepam and alprazolam stopped,  Gabapentin started  Hiatal hernia  Acting up  Has increased protonix to 80 mg dialy in the am sees GI in late September    Outpatient Medications Prior to Visit  Medication Sig Dispense Refill  . acetaminophen (TYLENOL) 500 MG tablet Take 1,000 mg by mouth every 6 (six) hours as needed for mild pain.    Marland Kitchen acyclovir ointment (ZOVIRAX) 5 % Apply 1 application topically 2 (two) times daily. As needed for fever blisters 14 g 3  . gabapentin (NEURONTIN) 300 MG capsule Take 1 capsule (300 mg total) by mouth 3 (three) times daily. 90 capsule 11  . OXcarbazepine (TRILEPTAL) 150 MG tablet Take 1 tablet (150 mg total) by mouth 2 (two) times daily. 60 tablet 11  . pantoprazole (PROTONIX) 40 MG tablet TAKE 1 TABLET BY MOUTH DAILY (Patient taking differently: REPORTS TAKING 2 TABLET BY MOUTH DAILY) 30 tablet 1  . vitamin B-12 (CYANOCOBALAMIN) 1000 MCG tablet Take 1,000 mcg by mouth daily.    . carbamazepine (TEGRETOL) 200 MG tablet TAKE 1 TABLET BY MOUTH THREE TIMES A DAY (Patient not taking: Reported on 03/07/2017) 90 tablet 1  . ondansetron (ZOFRAN) 4 MG tablet Take 1 tablet (4 mg total) by mouth every 6 (six) hours as needed for nausea. (Patient not taking: Reported on 03/07/2017) 20 tablet 0  . sucralfate (CARAFATE) 1  GM/10ML suspension Take 10 mLs (1 g total) by mouth 4 (four) times daily -  with meals and at bedtime. (Patient not taking: Reported on 03/07/2017) 420 mL 0   Facility-Administered Medications Prior to Visit  Medication Dose Route Frequency Provider Last Rate Last Dose  . gadopentetate dimeglumine (MAGNEVIST) injection 20 mL  20 mL Intravenous Once PRN Marcial Pacas, MD        Review of Systems;  Patient denies headache, fevers, malaise, unintentional weight loss, skin rash, eye pain, sinus congestion and sinus pain, sore throat, dysphagia,  hemoptysis , cough, dyspnea, wheezing, chest pain, palpitations, orthopnea, edema, abdominal pain, nausea, melena, diarrhea, constipation, flank pain, dysuria, hematuria, urinary  Frequency, nocturia, numbness, tingling, seizures,  Focal weakness, Loss of consciousness,  Tremor, insomnia, depression, anxiety, and suicidal ideation.      Objective:  BP (!) 130/96 (BP Location: Left Arm, Patient Position: Sitting, Cuff Size: Large)   Pulse (!) 102   Temp 98.4 F (36.9 C) (Oral)   Resp 16   Ht 5\' 8"  (1.727 m)   Wt 206 lb 3.2 oz (93.5 kg)   SpO2 97%   BMI 31.35 kg/m   BP Readings from Last 3 Encounters:  03/07/17 (!) 130/96  02/14/17 (!) 148/99  12/21/16 (!) 103/57    Wt Readings from Last 3 Encounters:  03/07/17 206 lb 3.2 oz (93.5 kg)  02/14/17  206 lb 12 oz (93.8 kg)  12/21/16 180 lb (81.6 kg)    General appearance: alert, cooperative and appears stated age Ears: normal TM's and external ear canals both ears Throat: lips, mucosa, and tongue normal; teeth and gums normal Neck: no adenopathy, no carotid bruit, supple, symmetrical, trachea midline and thyroid not enlarged, symmetric, no tenderness/mass/nodules Back: symmetric, no curvature. ROM normal. No CVA tenderness. Lungs: clear to auscultation bilaterally Heart: regular rate and rhythm, S1, S2 normal, no murmur, click, rub or gallop Abdomen: soft, non-tender; bowel sounds normal; no  masses,  no organomegaly Pulses: 2+ and symmetric Skin: Skin color, texture, turgor normal. No rashes or lesions Lymph nodes: Cervical, supraclavicular, and axillary nodes normal.  Lab Results  Component Value Date   HGBA1C 5.7 05/19/2011    Lab Results  Component Value Date   CREATININE 0.86 12/21/2016   CREATININE 0.74 05/20/2016   CREATININE 0.90 05/19/2016    Lab Results  Component Value Date   WBC 5.6 02/14/2017   HGB 13.7 02/14/2017   HCT 41.2 02/14/2017   PLT 371 02/14/2017   GLUCOSE 117 (H) 12/21/2016   CHOL 215 (H) 05/10/2016   TRIG 129.0 05/10/2016   HDL 62.90 05/10/2016   LDLCALC 126 (H) 05/10/2016   ALT 19 12/21/2016   AST 25 12/21/2016   NA 141 12/21/2016   K 4.5 12/21/2016   CL 106 12/21/2016   CREATININE 0.86 12/21/2016   BUN 12 12/21/2016   CO2 26 12/21/2016   TSH 2.680 02/14/2017   HGBA1C 5.7 05/19/2011   MICROALBUR 1.4 05/08/2012    Dg Chest 1 View  Result Date: 12/21/2016 CLINICAL DATA:  Dehydration EXAM: CHEST 1 VIEW COMPARISON:  04/04/2013 FINDINGS: Normal heart size. Minimal bibasilar atelectasis, but otherwise clear lungs. No pneumothorax. No pleural effusion. IMPRESSION: No active disease. Electronically Signed   By: Marybelle Killings M.D.   On: 12/21/2016 12:06    Assessment & Plan:   Problem List Items Addressed This Visit    Overweight (BMI 25.0-29.9)    I have addressed  BMI and recommended wt loss of 10% of body weight over the next 6 months using a low fat, low starch, high protein  fruit/vegetable based Mediterranean diet and 30 minutes of aerobic exercise a minimum of 5 days per week.   Lab Results  Component Value Date   TSH 2.680 02/14/2017            Other Visit Diagnoses    Screening breast examination    -  Primary   Relevant Orders   MM SCREENING BREAST TOMO BILATERAL      I have discontinued Ms. Banh's ondansetron, sucralfate, vitamin B-12, and carbamazepine. I am also having her maintain her acetaminophen, acyclovir  ointment, pantoprazole, OXcarbazepine, and gabapentin.  No orders of the defined types were placed in this encounter.   Medications Discontinued During This Encounter  Medication Reason  . carbamazepine (TEGRETOL) 200 MG tablet Patient has not taken in last 30 days  . ondansetron (ZOFRAN) 4 MG tablet Patient has not taken in last 30 days  . sucralfate (CARAFATE) 1 GM/10ML suspension Patient has not taken in last 30 days  . vitamin B-12 (CYANOCOBALAMIN) 1000 MCG tablet Patient has not taken in last 30 days    Follow-up: No Follow-up on file.   Crecencio Mc, MD

## 2017-03-07 NOTE — Patient Instructions (Signed)
Your goal weight is 195 lbs to get your BMI < 30 Your weight would need to be < 165 to get BMI < 25   All of these foods can be found at grocery stores and in bulk at [employer].  The Atkins protein bars and shakes are available in more varieties at [employer], [employer] and [place].     7 AM Breakfast:  Choose from the following:  Low carbohydrate Protein  Shakes (I recommend the  Premier Protein chocolate shakes,  EAS AdvantEdge "Carb Control" shakes  Or the Atkins shakes all are under 3 net carbs)        10 AM: high protein snack:  KIND snack size bar ,  The  low GI (3g sugar) 100 cal variety    A stick of cheese:  Around 1 carb,  100 cal     Dannon Light n Fit [place] Yogurt  (80 cal, 8 carbs)  Other so called "protein bars" and Greek yogurts tend to be loaded with carbohydrates.  Remember, in food advertising, the word "energy" is synonymous for " carbohydrate."  Lunch:   A Sandwich using the bread choices listed, Can use any  Eggs,  lunchmeat, grilled meat or canned tuna), avocado, regular mayo/mustard  and cheese.  A Salad using blue cheese, ranch,  Goddess or vinagrette,  Avoid taco shells, croutons or "confetti" and no "candied nuts"   Kale/broccoli slaw (([employer]) with the ginger soy  dredssing ("Makoto")   No pretzels, nabs  or chips.  Pickles and miniature sweet peppers are a good low carb alternative that provide a "crunch"     3 PM/ Mid day  Snack:  Consider  1 ounce of  almonds, walnuts, pistachios, pecans, peanuts,  Macadamia nuts or a nut medley.  3 mini sweet peppter    6 PM  Dinner:     Meat/fowl/fish with a green salad, and either broccoli, cauliflower, green beans, spinach, brussel sprouts or  Lima beans. DO NOT BREAD THE PROTEIN!!      There is a low carb pasta by Dreamfield's that is acceptable and tastes great: only 5 digestible carbs/serving.( All grocery stores but BJs carry it ) Several ready made meals are available low carb:   Try Michel Angelo's chicken  piccata or chicken or eggplant parm over low carb pasta.(Lowes and BJs)   Marjory Lies Sanchez's "Carnitas" (pulled pork, no sauce,  0 carbs) or his beef pot roast to make a dinner burrito (at [employer])  Pesto over low carb pasta (bj's sells a good quality pesto in the center refrigerated section of the deli   Try satueeing  Cheral Marker with mushroooms as a good side   [employer] makes a mashed cauliflower that tastes like mashed potatoes  Whole wheat pasta is still full of digestible carbs and  Not as low in glycemic index as Dreamfield's.   Brown rice is still rice,  So skip the rice and noodles if you eat [place] or [place] (or at least limit to 1/2 cup)  9 PM snack :   Breyer's "low carb" fudgsicle or  ice cream bar (Carb Smart line), or  Weight Watcher's ice cream bar , or another "no sugar added" ice cream;  a serving of fresh berries/cherries with whipped cream   Cheese or DANNON'S LlGHT N FIT GREEK YOGURT  8 ounces of [place] unsweetened almond/cococunut milk    Treat yourself to a parfait made with whipped cream blueberiies, walnuts and vanilla greek yogurt  Avoid bananas, pineapple, grapes  and watermelon on a regular basis because they are high in sugar.  THINK OF THEM AS DESSERT  Remember that snack Substitutions should be less than 10 NET carbs per serving and meals < 20 carbs. Remember to subtract fiber grams to get the "net carbs."

## 2017-03-08 NOTE — Assessment & Plan Note (Signed)
I have addressed  BMI and recommended wt loss of 10% of body weight over the next 6 months using a low fat, low starch, high protein  fruit/vegetable based Mediterranean diet and 30 minutes of aerobic exercise a minimum of 5 days per week.   Lab Results  Component Value Date   TSH 2.680 02/14/2017

## 2017-03-09 ENCOUNTER — Encounter: Payer: Self-pay | Admitting: Internal Medicine

## 2017-03-09 ENCOUNTER — Encounter: Payer: Self-pay | Admitting: Neurology

## 2017-03-09 MED ORDER — PANTOPRAZOLE SODIUM 40 MG PO TBEC: DELAYED_RELEASE_TABLET | ORAL | 1 refills | Status: DC

## 2017-03-10 NOTE — Procedures (Signed)
   HISTORY: 51 years old female presented with seizure-like activity  TECHNIQUE:  16 channel EEG was performed based on standard 10-16 international system. One channel was dedicated to EKG, which has demonstrates normal sinus rhythm of 72 beats per minutes.  Upon awakening, the posterior background activity was well-developed, in alpha range,  reactive to eye opening and closure.  There was no evidence of epileptiform discharge.  Photic stimulation was performed, which induced a symmetric photic driving.  Hyperventilation was performed, there was no abnormality elicit.  Stage II sleep was achieved.  CONCLUSION: This is a  normal awake and asleep EEG.  There is no electrodiagnostic evidence of epileptiform discharge.  Marcial Pacas, M.D. Ph.D.  [facility] [place], [place] [address] Phone: [phone] Fax:      [phone]

## 2017-03-26 ENCOUNTER — Other Ambulatory Visit: Payer: Self-pay | Admitting: Internal Medicine

## 2017-04-13 NOTE — Telephone Encounter (Signed)
Error

## 2017-04-21 ENCOUNTER — Ambulatory Visit (INDEPENDENT_AMBULATORY_CARE_PROVIDER_SITE_OTHER): Payer: BLUE CROSS/BLUE SHIELD | Admitting: Family

## 2017-04-21 ENCOUNTER — Ambulatory Visit (INDEPENDENT_AMBULATORY_CARE_PROVIDER_SITE_OTHER): Payer: BLUE CROSS/BLUE SHIELD

## 2017-04-21 ENCOUNTER — Other Ambulatory Visit: Payer: Self-pay | Admitting: Family

## 2017-04-21 ENCOUNTER — Encounter: Payer: Self-pay | Admitting: Internal Medicine

## 2017-04-21 ENCOUNTER — Encounter: Payer: Self-pay | Admitting: Family

## 2017-04-21 VITALS — BP 152/100 | HR 75 | Temp 98.2°F | Ht 68.0 in | Wt 211.0 lb

## 2017-04-21 DIAGNOSIS — M79671 Pain in right foot: Secondary | ICD-10-CM

## 2017-04-21 NOTE — Progress Notes (Signed)
Pre visit review using our clinic review tool, if applicable. No additional management support is needed unless otherwise documented below in the visit note. 

## 2017-04-21 NOTE — Progress Notes (Signed)
Subjective:    Patient ID: Mary Bryant, female    DOB: May 29, 1966, 51 y.o.   MRN: [id number]  CC: Mary Bryant is a 51 y.o. female who presents today for an acute visit.    HPI: CC: right ankle pain x 10 days ago, no improvement.  Heard pop sound when injury occurred. Can walk on heel however 'not flat footed.' End of day, swelling is worse. Ibuprofen with some relief of 'throb.' Has been wearing neoprene sleeve  Foot slipped off and she 'rolled' ankle when stepping out of car on curb.       HISTORY:  Past Medical History:  Diagnosis Date  . Esophageal stricture    dilated 2009,  [facility]  . GERD (gastroesophageal reflux disease)   . History of hiatal hernia   . Hypertension    no prior treatment  . Insomnia    chronic, no prior sleep study  . Left bundle branch block   . PONV (postoperative nausea and vomiting)   . rhinitis allergic   . Trigeminal neuralgia   . Wears contact lenses   . Wears hearing aid    bilateral   Past Surgical History:  Procedure Laterality Date  . ABDOMINAL HYSTERECTOMY  2000   no history of ca   . CHOLECYSTECTOMY  2008  . ESOPHAGOGASTRODUODENOSCOPY (EGD) WITH PROPOFOL N/A 05/12/2016   Procedure: ESOPHAGOGASTRODUODENOSCOPY (EGD) WITH PROPOFOL;  Surgeon: Jonathon Bellows, MD;  Location: [facility];  Service: Endoscopy;  Laterality: N/A;   Family History  Problem Relation Age of Onset  . Cancer Father        Bladder,  in Hospice  . Diabetes type II Father   . COPD Father   . Coronary artery disease Father   . Heart disease Father 37  . Diabetes Mother   . Hyperlipidemia Mother   . Hypertension Mother   . Early death Paternal Grandmother   . Heart disease Paternal Grandmother        CAD  . Prostate cancer Paternal Grandfather     Allergies: Promethazine and Benadryl [diphenhydramine hcl] Current Outpatient Prescriptions on File Prior to Visit  Medication Sig Dispense Refill  . acetaminophen (TYLENOL) 500 MG tablet Take  1,000 mg by mouth every 6 (six) hours as needed for mild pain.    Marland Kitchen acyclovir ointment (ZOVIRAX) 5 % Apply 1 application topically 2 (two) times daily. As needed for fever blisters 14 g 3  . gabapentin (NEURONTIN) 300 MG capsule Take 1 capsule (300 mg total) by mouth 3 (three) times daily. 90 capsule 11  . OXcarbazepine (TRILEPTAL) 150 MG tablet Take 1 tablet (150 mg total) by mouth 2 (two) times daily. 60 tablet 11  . pantoprazole (PROTONIX) 40 MG tablet REPORTS TAKING 2 TABLET BY MOUTH DAILY 30 tablet 1  . pantoprazole (PROTONIX) 40 MG tablet TAKE 1 TABLET BY MOUTH DAILY 30 tablet 1   Current Facility-Administered Medications on File Prior to Visit  Medication Dose Route Frequency Provider Last Rate Last Dose  . gadopentetate dimeglumine (MAGNEVIST) injection 20 mL  20 mL Intravenous Once PRN Marcial Pacas, MD        Social History  Substance Use Topics  . Smoking status: Never Smoker  . Smokeless tobacco: Never Used  . Alcohol use No    Review of Systems  Constitutional: Negative for chills and fever.  Respiratory: Negative for cough.   Cardiovascular: Negative for chest pain, palpitations and leg swelling.  Gastrointestinal: Negative for nausea and vomiting.  Musculoskeletal: Positive for joint swelling.  Skin: Negative for color change.      Objective:    BP (!) 152/100   Pulse 75   Temp 98.2 F (36.8 C) (Oral)   Ht 5\' 8"  (1.727 m)   Wt 211 lb (95.7 kg)   SpO2 95%   BMI 32.08 kg/m  BP Readings from Last 3 Encounters:  04/21/17 (!) 152/100  03/07/17 (!) 130/96  02/14/17 (!) 148/99     Physical Exam  Constitutional: She appears well-developed and well-nourished.  Eyes: Conjunctivae are normal.  Cardiovascular: Normal rate, regular rhythm, normal heart sounds and normal pulses.   Pulmonary/Chest: Effort normal and breath sounds normal. She has no wheezes. She has no rhonchi. She has no rales.  Musculoskeletal:       Right ankle: She exhibits normal range of motion,  no swelling, no ecchymosis and no laceration. Tenderness. No lateral malleolus and no medial malleolus tenderness found.       Feet:  No pain with Squeeze test at mid calf. No pain over lateral malleolus, medial malleolus, base of the fifth metatarsal. Pain over navicular bone.. Able to plantar and dorsi flex without pain.  Sensation intact equally bilateral lower extremities. Palpable pedal pulses. No ecchymosis, edema appreciated.   Neurological: She is alert.  Skin: Skin is warm and dry.  Psychiatric: She has a normal mood and affect. Her speech is normal and behavior is normal. Thought content normal.  Vitals reviewed.      Assessment & Plan:   1. Right foot pain Concern for fracture based on the pain in the navicular region, incomplete weight bearing, inversion injury and failure to improve with conservative therapy over the past 10 days. Pending x-rays. Of note, patient's blood pressure is elevated today. We jointly agreed likely due nature of her pain. She denies any chest pain, shortness of breath. She's been on metoprolol in the past. I advised her to start checking her blood pressure daily and to let us know if does not improve. Patient verbalized understanding.Patient also states she'll follow-up PCP regarding blood pressure.  - DG Ankle Complete Right - DG Foot Complete Right    I am having Ms. Yaden maintain her acetaminophen, acyclovir ointment, OXcarbazepine, gabapentin, pantoprazole, and pantoprazole.   No orders of the defined types were placed in this encounter.   Return precautions given.   Risks, benefits, and alternatives of the medications and treatment plan prescribed today were discussed, and patient expressed understanding.   Education regarding symptom management and diagnosis given to patient on AVS.  Continue to follow with Crecencio Mc, MD for routine health maintenance.   Hulda Marin and I agreed with plan.   Mable Paris, FNP

## 2017-04-21 NOTE — Patient Instructions (Signed)
Xrays  Concern for fracture  As discussed, if fracture seen on XR, I would like for you to go to  If your symptom do not improve, you may go to walk in orthopedic clinic. Information below:  [facility] [address]  M-F 1-7:30pm  [phone]   Keep your phone with you today for results

## 2017-04-24 ENCOUNTER — Other Ambulatory Visit: Payer: Self-pay | Admitting: Internal Medicine

## 2017-05-09 ENCOUNTER — Encounter: Payer: Self-pay | Admitting: Internal Medicine

## 2017-05-09 ENCOUNTER — Ambulatory Visit (INDEPENDENT_AMBULATORY_CARE_PROVIDER_SITE_OTHER): Payer: BLUE CROSS/BLUE SHIELD | Admitting: Internal Medicine

## 2017-05-09 VITALS — BP 128/86 | HR 91 | Temp 98.1°F | Resp 15 | Ht 68.0 in | Wt 212.0 lb

## 2017-05-09 DIAGNOSIS — R5383 Other fatigue: Secondary | ICD-10-CM | POA: Diagnosis not present

## 2017-05-09 DIAGNOSIS — E669 Obesity, unspecified: Secondary | ICD-10-CM

## 2017-05-09 DIAGNOSIS — F411 Generalized anxiety disorder: Secondary | ICD-10-CM

## 2017-05-09 DIAGNOSIS — R635 Abnormal weight gain: Secondary | ICD-10-CM

## 2017-05-09 LAB — CBC WITH DIFFERENTIAL/PLATELET
BASOS ABS: 0.1 10*3/uL (ref 0.0–0.1)
Basophils Relative: 1.3 % (ref 0.0–3.0)
EOS PCT: 3 % (ref 0.0–5.0)
Eosinophils Absolute: 0.2 10*3/uL (ref 0.0–0.7)
HEMATOCRIT: 40.3 % (ref 36.0–46.0)
HEMOGLOBIN: 12.9 g/dL (ref 12.0–15.0)
LYMPHS ABS: 1.7 10*3/uL (ref 0.7–4.0)
LYMPHS PCT: 26.5 % (ref 12.0–46.0)
MCHC: 32.1 g/dL (ref 30.0–36.0)
MCV: 87.5 fl (ref 78.0–100.0)
Monocytes Absolute: 0.5 10*3/uL (ref 0.1–1.0)
Monocytes Relative: 8.4 % (ref 3.0–12.0)
NEUTROS PCT: 60.8 % (ref 43.0–77.0)
Neutro Abs: 3.8 10*3/uL (ref 1.4–7.7)
Platelets: 345 10*3/uL (ref 150.0–400.0)
RBC: 4.6 Mil/uL (ref 3.87–5.11)
RDW: 14.3 % (ref 11.5–15.5)
WBC: 6.3 10*3/uL (ref 4.0–10.5)

## 2017-05-09 LAB — LIPID PANEL
CHOLESTEROL: 165 mg/dL (ref 0–200)
HDL: 61 mg/dL (ref >39.00)
LDL CALC: 86 mg/dL (ref 0–99)
NonHDL: 103.87
Total CHOL/HDL Ratio: 3
Triglycerides: 88 mg/dL (ref 0.0–149.0)
VLDL: 17.6 mg/dL (ref 0.0–40.0)

## 2017-05-09 LAB — VITAMIN B12: VITAMIN B 12: 255 pg/mL (ref 211–911)

## 2017-05-09 LAB — COMPREHENSIVE METABOLIC PANEL
ALBUMIN: 4 g/dL (ref 3.5–5.2)
ALK PHOS: 88 U/L (ref 39–117)
ALT: 21 U/L (ref 0–35)
AST: 24 U/L (ref 0–37)
BUN: 11 mg/dL (ref 6–23)
CHLORIDE: 105 meq/L (ref 96–112)
CO2: 29 mEq/L (ref 19–32)
Calcium: 9.5 mg/dL (ref 8.4–10.5)
Creatinine, Ser: 0.94 mg/dL (ref 0.40–1.20)
GFR: 67.04 mL/min (ref >60.00)
Glucose, Bld: 107 mg/dL — ABNORMAL HIGH (ref 70–99)
Potassium: 4.1 mEq/L (ref 3.5–5.1)
Sodium: 141 mEq/L (ref 135–145)
TOTAL PROTEIN: 7.4 g/dL (ref 6.0–8.3)
Total Bilirubin: 0.3 mg/dL (ref 0.2–1.2)

## 2017-05-09 LAB — HEMOGLOBIN A1C: Hgb A1c MFr Bld: 6 % (ref 4.6–6.5)

## 2017-05-09 MED ORDER — SPIRONOLACTONE 50 MG PO TABS: 50.0000 mg | ORAL_TABLET | Freq: Every day | ORAL | 3 refills | Status: DC

## 2017-05-09 MED ORDER — PANTOPRAZOLE SODIUM 40 MG PO TBEC: 40.0000 mg | DELAYED_RELEASE_TABLET | Freq: Every day | ORAL | 11 refills | Status: DC

## 2017-05-09 NOTE — Progress Notes (Signed)
Subjective:  Patient ID: Mary Bryant, female    DOB: [birth year]  Age: 51 y.o. MRN: [id number]  CC: The primary encounter diagnosis was Weight gain. Diagnoses of Fatigue, unspecified type, GAD (generalized anxiety disorder), and Obesity (BMI 30.0-34.9) were also pertinent to this visit.  HPI:   Mary Bryant presents for management of weight gain.  Requesting a pill to "jump start her.'  40 lb WEIGHT GAIN REVIEWED:  Weighed 170 in Nov 2014 , 192 in Nov 2015 ,  196 Nov 2016,  200 in  Nov 2017, 212 currently  Reports that she  Has tried  averaging 1200 cal or less  daily  .  Not exercising, Has entered menopause,  And feels her breasts are enlarging due to weight gain.  Taking meds for anxiety (mother is Pension scheme manager) father has bladder cancer.   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  Has daughters and grandchildren,  and parents to care for.   sleeping better with xanax and lexapro.  Anxiety much better   evaluation and treatment for positive depression screen done via MyChart     Outpatient Medications Prior to Visit  Medication Sig Dispense Refill  . acetaminophen (TYLENOL) 500 MG tablet Take 1,000 mg by mouth every 6 (six) hours as needed for mild pain.    Marland Kitchen acyclovir ointment (ZOVIRAX) 5 % Apply 1 application topically 2 (two) times daily. As needed for fever blisters 14 g 3  . ALPRAZolam (XANAX) 0.5 MG tablet TAKE 1 TABLET BY MOUTH AS DIRECTED 30 tablet 2  . escitalopram (LEXAPRO) 10 MG tablet TAKE 1 TABLET (10 MG TOTAL) BY MOUTH DAILY. 30 tablet 1  . gabapentin (NEURONTIN) 300 MG capsule Take 1 capsule (300 mg total) by mouth 3 (three) times daily. 90 capsule 11  . OXcarbazepine (TRILEPTAL) 150 MG tablet Take 1 tablet (150 mg total) by mouth 2 (two) times daily. 60 tablet 11  . pantoprazole (PROTONIX) 40 MG tablet REPORTS TAKING 2 TABLET BY MOUTH  DAILY 30 tablet 1  . pantoprazole (PROTONIX) 40 MG tablet TAKE 1 TABLET BY MOUTH DAILY (Patient not taking: Reported on 05/09/2017) 30 tablet 1   Facility-Administered Medications Prior to Visit  Medication Dose Route Frequency Provider Last Rate Last Dose  . gadopentetate dimeglumine (MAGNEVIST) injection 20 mL  20 mL Intravenous Once PRN Marcial Pacas, MD        Review of Systems;  Patient denies headache, fevers, malaise, unintentional weight loss, skin rash, eye pain, sinus congestion and sinus pain, sore throat, dysphagia,  hemoptysis , cough, dyspnea, wheezing, chest pain, palpitations, orthopnea, edema, abdominal pain, nausea, melena, diarrhea, constipation, flank pain, dysuria, hematuria, urinary  Frequency, nocturia, numbness, tingling, seizures,  Focal weakness, Loss of consciousness,  Tremor, insomnia, depression, anxiety, and suicidal ideation.      Objective:  BP 128/86 (BP Location: Left Arm, Patient Position: Sitting, Cuff Size: Normal)   Pulse 91   Temp 98.1 F (36.7 C) (Oral)   Resp 15   Ht 5\' 8"  (1.727 m)   Wt 212 lb (96.2 kg)   SpO2 96%   BMI 32.23 kg/m   BP Readings from Last 3 Encounters:  05/09/17 128/86  04/21/17 (!) 152/100  03/07/17 (!) 130/96    Wt Readings from Last 3 Encounters:  05/09/17 212 lb (96.2 kg)  04/21/17 211 lb (95.7 kg)  03/07/17 206 lb 3.2 oz (93.5 kg)    General appearance: alert, cooperative and appears stated age Ears: normal TM's and external ear canals both ears Throat: lips, mucosa, and tongue normal; teeth and gums normal Neck: no adenopathy, no carotid bruit, supple, symmetrical, trachea midline and thyroid not enlarged, symmetric, no tenderness/mass/nodules Back: symmetric, no curvature. ROM normal. No CVA tenderness. Lungs: clear to auscultation bilaterally Heart: regular rate and rhythm, S1, S2 normal, no murmur, click, rub or gallop Abdomen: soft, non-tender; bowel sounds normal; no masses,  no organomegaly Pulses: 2+ and  symmetric Skin: Skin color, texture, turgor normal. No rashes or lesions Lymph nodes: Cervical, supraclavicular, and axillary nodes normal.  Lab Results  Component Value Date   HGBA1C 6.0 05/09/2017   HGBA1C 5.7 05/19/2011    Lab Results  Component Value Date   CREATININE 0.94 05/09/2017   CREATININE 0.86 12/21/2016   CREATININE 0.74 05/20/2016    Lab Results  Component Value Date   WBC 6.3 05/09/2017   HGB 12.9 05/09/2017   HCT 40.3 05/09/2017   PLT 345.0 05/09/2017   GLUCOSE 107 (H) 05/09/2017   CHOL 165 05/09/2017   TRIG 88.0 05/09/2017   HDL 61.00 05/09/2017   LDLCALC 86 05/09/2017   ALT 21 05/09/2017   AST 24 05/09/2017   NA 141 05/09/2017   K 4.1 05/09/2017   CL 105 05/09/2017   CREATININE 0.94 05/09/2017   BUN 11 05/09/2017   CO2 29 05/09/2017   TSH 2.680 02/14/2017   HGBA1C 6.0 05/09/2017   MICROALBUR 1.4 05/08/2012    Dg Chest 1 View  Result Date: 12/21/2016 CLINICAL DATA:  Dehydration EXAM: CHEST 1 VIEW COMPARISON:  04/04/2013 FINDINGS: Normal heart size. Minimal bibasilar atelectasis, but otherwise clear lungs. No pneumothorax. No pleural effusion. IMPRESSION: No active disease. Electronically Signed   By: Marybelle Killings M.D.   On: 12/21/2016 12:06    Assessment & Plan:   Problem List Items Addressed This Visit    GAD (generalized anxiety disorder)    Symptoms have improved with use of xanax and lexapro .       Obesity (BMI 30.0-34.9)    With prediabetes noted by a1c. I have addressed  BMI and recommended wt loss of 10% of body weight over the next 6 months using a low fat, low starch, high protein  fruit/vegetable based Mediterranean diet and 30 minutes of aerobic exercise a minimum of 5 days per week.   Lab Results  Component Value Date   TSH 2.680 02/14/2017   Lab Results  Component Value Date   HGBA1C 6.0 05/09/2017           Other Visit Diagnoses    Weight gain    -  Primary   Relevant Orders   Hemoglobin A1c (Completed)   Lipid  panel (Completed)   Comprehensive metabolic panel (Completed)   Fatigue, unspecified type       Relevant Orders   Vitamin B12 (Completed)   Folate RBC (Completed)   CBC with Differential/Platelet (Completed)     A total of 25 minutes of face to face time was spent with patient more than half of which was spent in counselling about the above mentioned conditions  and coordination of care   I have discontinued Ms. Hagstrom's pantoprazole. I have also changed her pantoprazole. Additionally, I am having her start on spironolactone. Lastly, I am having her maintain her acetaminophen, acyclovir ointment, OXcarbazepine, gabapentin, ALPRAZolam, and escitalopram.  Meds ordered this encounter  Medications  .  spironolactone (ALDACTONE) 50 MG tablet    Sig: Take 1 tablet (50 mg total) by mouth daily. As needed for fluid retention    Dispense:  30 tablet    Refill:  3  . pantoprazole (PROTONIX) 40 MG tablet    Sig: Take 1 tablet (40 mg total) by mouth daily. REPORTS TAKING 2 TABLET BY MOUTH DAILY    Dispense:  30 tablet    Refill:  11    Medications Discontinued During This Encounter  Medication Reason  . pantoprazole (PROTONIX) 40 MG tablet Duplicate  . pantoprazole (PROTONIX) 40 MG tablet Reorder    Follow-up: No Follow-up on file.   Crecencio Mc, MD

## 2017-05-09 NOTE — Patient Instructions (Addendum)
Try using [email or web address] to help guide your diet efforts.   30 minutes of cardio daily is your goal :  Use the  Jump rope,  Jumping jacks,  Mini trampoline,  Dancing and biking   RTC 3 months

## 2017-05-10 LAB — FOLATE RBC: RBC Folate: 553 ng/mL RBC (ref >280)

## 2017-05-10 NOTE — Assessment & Plan Note (Addendum)
With prediabetes noted by a1c. I have addressed  BMI and recommended wt loss of 10% of body weight over the next 6 months using a low fat, low starch, high protein  fruit/vegetable based Mediterranean diet and 30 minutes of aerobic exercise a minimum of 5 days per week.   Lab Results  Component Value Date   TSH 2.680 02/14/2017   Lab Results  Component Value Date   HGBA1C 6.0 05/09/2017

## 2017-05-10 NOTE — Assessment & Plan Note (Signed)
Symptoms have improved with use of xanax and lexapro .

## 2017-05-11 ENCOUNTER — Encounter: Payer: Self-pay | Admitting: Internal Medicine

## 2017-05-12 ENCOUNTER — Emergency Department
Admission: EM | Admit: 2017-05-12 | Discharge: 2017-05-12 | Disposition: A | Discharge status: Home / Self Care | Payer: BLUE CROSS/BLUE SHIELD | Attending: Emergency Medicine | Admitting: Emergency Medicine

## 2017-05-12 ENCOUNTER — Emergency Department: Payer: BLUE CROSS/BLUE SHIELD

## 2017-05-12 DIAGNOSIS — F419 Anxiety disorder, unspecified: Secondary | ICD-10-CM | POA: Insufficient documentation

## 2017-05-12 DIAGNOSIS — I10 Essential (primary) hypertension: Secondary | ICD-10-CM | POA: Diagnosis not present

## 2017-05-12 DIAGNOSIS — I2510 Atherosclerotic heart disease of native coronary artery without angina pectoris: Secondary | ICD-10-CM | POA: Insufficient documentation

## 2017-05-12 DIAGNOSIS — R531 Weakness: Secondary | ICD-10-CM | POA: Diagnosis present

## 2017-05-12 DIAGNOSIS — Z9049 Acquired absence of other specified parts of digestive tract: Secondary | ICD-10-CM | POA: Diagnosis not present

## 2017-05-12 DIAGNOSIS — Z79899 Other long term (current) drug therapy: Secondary | ICD-10-CM | POA: Diagnosis not present

## 2017-05-12 LAB — BASIC METABOLIC PANEL
ANION GAP: 10 (ref 5–15)
BUN: 12 mg/dL (ref 6–20)
CHLORIDE: 109 mmol/L (ref 101–111)
CO2: 20 mmol/L — ABNORMAL LOW (ref 22–32)
Calcium: 8.9 mg/dL (ref 8.9–10.3)
Creatinine, Ser: 0.96 mg/dL (ref 0.44–1.00)
GFR calc Af Amer: >60 mL/min (ref >60)
Glucose, Bld: 95 mg/dL (ref 65–99)
POTASSIUM: 3.9 mmol/L (ref 3.5–5.1)
SODIUM: 139 mmol/L (ref 135–145)

## 2017-05-12 LAB — URINALYSIS, COMPLETE (UACMP) WITH MICROSCOPIC
BACTERIA UA: NONE SEEN
Bilirubin Urine: NEGATIVE
Glucose, UA: NEGATIVE mg/dL
HGB URINE DIPSTICK: NEGATIVE
Ketones, ur: NEGATIVE mg/dL
Leukocytes, UA: NEGATIVE
NITRITE: NEGATIVE
Protein, ur: NEGATIVE mg/dL
Specific Gravity, Urine: 1.013 (ref 1.005–1.030)
pH: 5 (ref 5.0–8.0)

## 2017-05-12 LAB — CBC WITH DIFFERENTIAL/PLATELET
BASOS ABS: 0.1 10*3/uL (ref 0–0.1)
BASOS PCT: 1 %
Eosinophils Absolute: 0.2 10*3/uL (ref 0–0.7)
Eosinophils Relative: 3 %
HEMATOCRIT: 38.7 % (ref 35.0–47.0)
HEMOGLOBIN: 12.9 g/dL (ref 12.0–16.0)
LYMPHS PCT: 28 %
Lymphs Abs: 2.4 10*3/uL (ref 1.0–3.6)
MCH: 28.4 pg (ref 26.0–34.0)
MCHC: 33.4 g/dL (ref 32.0–36.0)
MCV: 85.1 fL (ref 80.0–100.0)
MONO ABS: 1 10*3/uL — AB (ref 0.2–0.9)
Monocytes Relative: 12 %
NEUTROS ABS: 4.8 10*3/uL (ref 1.4–6.5)
NEUTROS PCT: 56 %
Platelets: 343 10*3/uL (ref 150–440)
RBC: 4.55 MIL/uL (ref 3.80–5.20)
RDW: 14.5 % (ref 11.5–14.5)
WBC: 8.6 10*3/uL (ref 3.6–11.0)

## 2017-05-12 LAB — GLUCOSE, CAPILLARY: GLUCOSE-CAPILLARY: 85 mg/dL (ref 65–99)

## 2017-05-12 LAB — TSH: TSH: 1.79 u[IU]/mL (ref 0.350–4.500)

## 2017-05-12 LAB — TROPONIN I

## 2017-05-12 MED ORDER — LORAZEPAM 2 MG/ML IJ SOLN
1.0000 mg | Freq: Once | INTRAMUSCULAR | Status: AC
  Administered 2017-05-12: 1 mg via INTRAVENOUS
  Filled 2017-05-12: qty 1

## 2017-05-12 MED ORDER — SODIUM CHLORIDE 0.9 % IV BOLUS (SEPSIS)
1000.0000 mL | Freq: Once | INTRAVENOUS | Status: AC
  Administered 2017-05-12: 1000 mL via INTRAVENOUS

## 2017-05-12 NOTE — ED Triage Notes (Signed)
Pt to ED via EMS from work with c/o sudden onset weakness and shaking. Pt states she feels like she is going to pass out. Pt A&Ox4, cbg 68 on arrival. Pt denies any pain. Pt hx of TMJ . MD at bedside

## 2017-05-12 NOTE — ED Notes (Signed)
Pt verbalilzes understanding of d/c teaching and follow up. Pt in NAD , VS stable, in WC to lobby with spouse. Pt signed e signature paper copy

## 2017-05-12 NOTE — ED Provider Notes (Signed)
[facility] Emergency Department Provider Note  ____________________________________________   First MD Initiated Contact with Patient 05/12/17 1240     (approximate)  I have reviewed the triage vital signs and the nursing notes.   HISTORY  Chief Complaint Weakness and Tremors   HPI Mary Bryant is a 51 y.o. female with a history of esophageal stricture, hypertension and recent diagnosis of diabetes via A1c level who is presenting to the emergency department today with weakness and possible syncope. The patient says that she felt very weak this morning, drank a right bundle, and then was found resting on her desk at work today. She denies playing herself down on her desk.  She is denying any pain at this time but has admitted to tremors ever since EMS was called. Has also developed a left-sided headache which is mild to moderate. Says that she gets "pseudoseizures" when she gets nervous. Has not felt panicked earlier in the day. Says that she takes a daily Lexapro as well as Xanax on a daily basis. Says that she is already taken Xanax today.   Past Medical History:  Diagnosis Date  . Esophageal stricture    dilated 2009,  [place]  . GERD (gastroesophageal reflux disease)   . History of hiatal hernia   . Hypertension    no prior treatment  . Insomnia    chronic, no prior sleep study  . Left bundle branch block   . PONV (postoperative nausea and vomiting)   . rhinitis allergic   . Trigeminal neuralgia   . Wears contact lenses   . Wears hearing aid    bilateral    Patient Active Problem List   Diagnosis Date Noted  . Left facial pain 02/14/2017  . Seizure-like activity ([place]) 02/14/2017  . Hospital discharge follow-up 05/30/2016  . Musculoskeletal pain 05/17/2016  . Helicobacter pylori gastritis 05/12/2016  . Encounter for preventive health examination 05/10/2015  . Menopausal hot flushes 05/10/2015  . S/P total hysterectomy 05/08/2015  .  RLQ abdominal pain 04/04/2015  . 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 Long-term use of high-risk medication 04/21/2014  . Trigeminal neuralgia of left side of face 03/22/2014  . 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 Occasional tremors 01/07/2014  . Chronic headaches 11/21/2013  . GAD (generalized anxiety disorder) 10/25/2013  . Hiatal hernia with GERD and esophagitis 10/25/2013  . H pylori ulcer 10/25/2013  . History of breast lump/mass excision 08/08/2013  . Vitamin D deficiency 06/10/2013  . Generalized anxiety disorder 06/08/2013  . Screening for breast cancer 06/08/2013  . CAD in native artery 06/08/2013  . H/O left bundle branch block 06/08/2013  . Chest pain 04/04/2013  . Obesity (BMI 30.0-34.9) 01/17/2012  . Insomnia   . Esophageal stricture     Past Surgical History:  Procedure Laterality Date  . ABDOMINAL HYSTERECTOMY  2000   no history of ca   . CHOLECYSTECTOMY  2008  . ESOPHAGOGASTRODUODENOSCOPY (EGD) WITH PROPOFOL N/A 05/12/2016   Procedure: ESOPHAGOGASTRODUODENOSCOPY (EGD) WITH PROPOFOL;  Surgeon: Jonathon Bellows, MD;  Location: [place];  Service: Endoscopy;  Laterality: N/A;    Prior to Admission medications   Medication Sig Start Date End Date Taking? Authorizing Provider  acetaminophen (TYLENOL) 500 MG tablet Take 1,000 mg by mouth every 6 (six) hours as needed for mild pain.    [provider]  acyclovir ointment (ZOVIRAX) 5 % Apply 1 application topically 2 (two) times daily. As needed for fever  blisters 05/28/16   Crecencio Mc, MD  ALPRAZolam Duanne Moron) 0.5 MG tablet TAKE 1 TABLET BY MOUTH AS DIRECTED 04/25/17   Crecencio Mc, MD  escitalopram (LEXAPRO) 10 MG tablet TAKE 1 TABLET (10 MG TOTAL) BY MOUTH DAILY. 04/25/17   Crecencio Mc, MD  gabapentin (NEURONTIN) 300 MG capsule Take 1 capsule (300 mg total) by mouth 3 (three) times daily. 02/14/17   Marcial Pacas, MD  OXcarbazepine (TRILEPTAL) 150 MG tablet Take 1  tablet (150 mg total) by mouth 2 (two) times daily. 02/14/17   Marcial Pacas, MD  pantoprazole (PROTONIX) 40 MG tablet Take 1 tablet (40 mg total) by mouth daily. REPORTS TAKING 2 TABLET BY MOUTH DAILY 05/09/17   Crecencio Mc, MD  spironolactone (ALDACTONE) 50 MG tablet Take 1 tablet (50 mg total) by mouth daily. As needed for fluid retention 05/09/17   Crecencio Mc, MD    Allergies Promethazine; Benadryl [diphenhydramine hcl]; and Phenothiazines  Family History  Problem Relation Age of Onset  . Cancer Father        Bladder,  in Hospice  . Diabetes type II Father   . COPD Father   . Coronary artery disease Father   . Heart disease Father 84  . Diabetes Mother   . Hyperlipidemia Mother   . Hypertension Mother   . Early death Paternal Grandmother   . Heart disease Paternal Grandmother        CAD  . Prostate cancer Paternal Grandfather     Social History Social History  Substance Use Topics  . Smoking status: Never Smoker  . Smokeless tobacco: Never Used  . Alcohol use No    Review of Systems  Constitutional: No fever/chills Eyes: No visual changes. ENT: No sore throat. Cardiovascular: Denies chest pain. Respiratory: Denies shortness of breath. Gastrointestinal: No abdominal pain.  No nausea, no vomiting.  No diarrhea.  No constipation. Genitourinary: Negative for dysuria. Musculoskeletal: Negative for back pain. Skin: Negative for rash. Neurological: Negative for focal weakness or numbness.   ____________________________________________   PHYSICAL EXAM:  VITAL SIGNS: ED Triage Vitals  Enc Vitals Group     BP 05/12/17 1236 (!) 171/102     Pulse Rate 05/12/17 1236 100     Resp 05/12/17 1236 16     Temp --      Temp src --      SpO2 05/12/17 1236 97 %     Weight --      Height --      Head Circumference --      Peak Flow --      Pain Score 05/12/17 1233 0     Pain Loc --      Pain Edu? --      Excl. in [place]? --     Constitutional: Alert and oriented.   in no acute distress. Slight tremulousness of the bilateral upper lower extremities. Eyes: Conjunctivae are normal.  Head: Atraumatic. Nose: No congestion/rhinnorhea. Mouth/Throat: Mucous membranes are moist.  Neck: No stridor.   Cardiovascular: Normal rate, regular rhythm. Grossly normal heart sounds.   Respiratory: Normal respiratory effort.  No retractions. Lungs CTAB. Gastrointestinal: Soft and nontender. No distention.  Musculoskeletal: No lower extremity tenderness nor edema.  No joint effusions. Neurologic:  Normal speech and language. No gross focal neurologic deficits are appreciated. Skin:  Skin is warm, Bryant and intact. No rash noted. Psychiatric: Mood and affect are normal. Speech and behavior are normal.  ____________________________________________   LABS (all labs  ordered are listed, but only abnormal results are displayed)  Labs Reviewed  GLUCOSE, CAPILLARY  URINALYSIS, COMPLETE (UACMP) WITH MICROSCOPIC  BASIC METABOLIC PANEL  TROPONIN I  CBC WITH DIFFERENTIAL/PLATELET   ____________________________________________  EKG  ED ECG REPORT I, Knute Mazzuca,  Youlanda Roys, the attending physician, personally viewed and interpreted this ECG.   Date: 05/12/2017  EKG Time: 1320  Rate: 87  Rhythm: normal sinus rhythm  Axis: Normal  Intervals:left bundle branch block  ST&T Change: No ST segment elevation or depression. T-wave inversions in 1 and aVL. No significant change or previous EKGs of the left bundle branch block appears old.  ____________________________________________  RADIOLOGY  Unremarkable CT head ____________________________________________   PROCEDURES  Procedure(s) performed:   Procedures  Critical Care performed:   ____________________________________________   INITIAL IMPRESSION / ASSESSMENT AND PLAN / ED COURSE  Pertinent labs & imaging results that were available during my care of the patient were reviewed by me and considered in my medical  decision making (see chart for details).  DDX: Syncope, panic attack, tension headache, tremulousness  As part of my medical decision making, I reviewed the following data within the [facility] chart reviewed and Notes from prior ED visits  ----------------------------------------- 2:23 PM on 05/12/2017 -----------------------------------------  Patient feeling sleepy after the Ativan but the tremors have resolved. Reassuring lab work. Patient says that she is scheduled to start B12 injections but is asking about starting a B12 vitamin. I said for her that this will be okay to start over the counter B12. TSH is pending. I discussed follow with Dr. Vanita Ingles.  The patient says that she is going to go home and rest for worse the day. She is understanding the plan and willing to comply but no emergent cause for the patient's weakness found today's workup.      ____________________________________________   FINAL CLINICAL IMPRESSION(S) / ED DIAGNOSES  Weakness.    NEW MEDICATIONS STARTED DURING THIS VISIT:  New Prescriptions   No medications on file     Note:  This document was prepared using Dragon voice recognition software and may include unintentional dictation errors.     Orbie Pyo, MD 05/12/17 [phone]

## 2017-05-13 ENCOUNTER — Encounter: Payer: Self-pay | Admitting: Internal Medicine

## 2017-05-16 ENCOUNTER — Telehealth: Payer: Self-pay | Admitting: Neurology

## 2017-05-16 ENCOUNTER — Ambulatory Visit (INDEPENDENT_AMBULATORY_CARE_PROVIDER_SITE_OTHER): Payer: BLUE CROSS/BLUE SHIELD | Admitting: Neurology

## 2017-05-16 ENCOUNTER — Encounter: Payer: Self-pay | Admitting: Neurology

## 2017-05-16 ENCOUNTER — Ambulatory Visit (INDEPENDENT_AMBULATORY_CARE_PROVIDER_SITE_OTHER): Payer: BLUE CROSS/BLUE SHIELD

## 2017-05-16 VITALS — BP 143/98 | HR 82 | Ht 68.0 in | Wt 211.0 lb

## 2017-05-16 DIAGNOSIS — R569 Unspecified convulsions: Secondary | ICD-10-CM | POA: Diagnosis not present

## 2017-05-16 DIAGNOSIS — E538 Deficiency of other specified B group vitamins: Secondary | ICD-10-CM

## 2017-05-16 DIAGNOSIS — G43709 Chronic migraine without aura, not intractable, without status migrainosus: Secondary | ICD-10-CM

## 2017-05-16 DIAGNOSIS — IMO0002 Reserved for concepts with insufficient information to code with codable children: Secondary | ICD-10-CM | POA: Insufficient documentation

## 2017-05-16 MED ORDER — SUMATRIPTAN SUCCINATE 50 MG PO TABS: 50.0000 mg | ORAL_TABLET | ORAL | 11 refills | Status: DC | PRN

## 2017-05-16 MED ORDER — CYANOCOBALAMIN 1000 MCG/ML IJ SOLN
1000.0000 ug | Freq: Once | INTRAMUSCULAR | Status: AC
  Administered 2017-05-16: 1000 ug via INTRAMUSCULAR

## 2017-05-16 NOTE — Progress Notes (Signed)
PATIENT: Mary Bryant DOB: 1965/11/06  Chief Complaint  Patient presents with  . Left facial pain    She would like to review her MRI results.    . Seizure-like activity    She would like to review her EEG results.  Reports seizure-like activity on 05/12/17.       HISTORICAL  Mary Bryant 51 years old right-handed female, seen in refer by her primary care doctor  Crecencio Mc, for facial nerve pain, initial evaluation was on February 14 2017.  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, at the same time she had recurrent episode of pouding sensation that her left face, feeling flushed, feverish, initially the episode was intermittent, since January 2018, she had recurrent episode of almost daily basis, she complains of left facial pulling sensation, spreading to left temporal, and also left front teeth, was seen by dentist there was no significant improvement,  Because of her pseudoseizures spells, set onset difficulty talking, whole-body shaking, lasting for a few hours, she has been treated with Tegretol 400 mg twice a day, which has helped her symptoms Somes, but in the middle of the day, she still has recurrent episode of left facial discomfort, she works at Therapist, art, on the phone all day time, also complains of bilateral hearing loss.  I personally reviewed CT head without contrast in May 2016 that was normal, laboratory evaluations in June 2018 showed elevated CBC 17, normal CMP with exception of mild elevated glucose 117,  Update May 16 2017: Laboratory evaluations in October 2018, normal TSH, CBC hemoglobin of 12 point 9, negative troponin, normal BMP, UA,  We have personally reviewed MRI of the brain without contrast in August 2018: Mild supratentorium small vessel disease, no acute abnormality  EEG was normal  I reviewed emergency room record on May 12 2017, she drank a lots of red bull, she was being shaked by her coworker, body tremor, she  was given Vit B12 shot,  Level was 255 pg/ml.  She feel like exhausted after the spell, had severe left-sided migraine headache last for a few hours  IEW OF SYSTEMS: Full 14 system review of systems performed and notable only for appetite change, fatigue, facial swelling, light sensitivity, choking, chest tightness, bruise, dizziness, headache, seizure, weakness, tremor passing out, facial drooping, depression anxiety daytime sleepiness   ALLERGIES: Allergies  Allergen Reactions  . Promethazine Anaphylaxis  . Benadryl [Diphenhydramine Hcl] Hives  . Phenothiazines Other (See Comments)    HOME MEDICATIONS: Current Outpatient Prescriptions  Medication Sig Dispense Refill  . acetaminophen (TYLENOL) 500 MG tablet Take 1,000 mg by mouth every 6 (six) hours as needed for mild pain.    Marland Kitchen acyclovir ointment (ZOVIRAX) 5 % Apply 1 application topically 2 (two) times daily. As needed for fever blisters 14 g 3  . ALPRAZolam (XANAX) 0.5 MG tablet TAKE 1 TABLET BY MOUTH AS DIRECTED 30 tablet 2  . Cyanocobalamin (B-12 COMPLIANCE INJECTION IJ) Inject as directed.    . escitalopram (LEXAPRO) 10 MG tablet TAKE 1 TABLET (10 MG TOTAL) BY MOUTH DAILY. 30 tablet 1  . gabapentin (NEURONTIN) 300 MG capsule Take 1 capsule (300 mg total) by mouth 3 (three) times daily. 90 capsule 11  . OXcarbazepine (TRILEPTAL) 150 MG tablet Take 1 tablet (150 mg total) by mouth 2 (two) times daily. 60 tablet 11  . pantoprazole (PROTONIX) 40 MG tablet Take 1 tablet (40 mg total) by mouth daily. REPORTS TAKING 2 TABLET BY MOUTH  DAILY 30 tablet 11  . spironolactone (ALDACTONE) 50 MG tablet Take 1 tablet (50 mg total) by mouth daily. As needed for fluid retention 30 tablet 3   No current facility-administered medications for this visit.    Facility-Administered Medications Ordered in Other Visits  Medication Dose Route Frequency Provider Last Rate Last Dose  . gadopentetate dimeglumine (MAGNEVIST) injection 20 mL  20 mL  Intravenous Once PRN Marcial Pacas, MD        PAST MEDICAL HISTORY: Past Medical History:  Diagnosis Date  . Esophageal stricture    dilated 2009,  [facility]  . GERD (gastroesophageal reflux disease)   . History of hiatal hernia   . Hypertension    no prior treatment  . Insomnia    chronic, no prior sleep study  . Left bundle branch block   . PONV (postoperative nausea and vomiting)   . rhinitis allergic   . Trigeminal neuralgia   . Wears contact lenses   . Wears hearing aid    bilateral    PAST SURGICAL HISTORY: Past Surgical History:  Procedure Laterality Date  . ABDOMINAL HYSTERECTOMY  2000   no history of ca   . CHOLECYSTECTOMY  2008  . ESOPHAGOGASTRODUODENOSCOPY (EGD) WITH PROPOFOL N/A 05/12/2016   Procedure: ESOPHAGOGASTRODUODENOSCOPY (EGD) WITH PROPOFOL;  Surgeon: Jonathon Bellows, MD;  Location: [facility];  Service: Endoscopy;  Laterality: N/A;    FAMILY HISTORY: Family History  Problem Relation Age of Onset  . Cancer Father        Bladder,  in Hospice  . Diabetes type II Father   . COPD Father   . Coronary artery disease Father   . Heart disease Father 54  . Diabetes Mother   . Hyperlipidemia Mother   . Hypertension Mother   . Early death Paternal Grandmother   . Heart disease Paternal Grandmother        CAD  . Prostate cancer Paternal Grandfather     SOCIAL HISTORY:  Social History   Social History  . Marital status: Single    Spouse name: Mary Bryant  . Number of children: 3  . Years of education: 14   Occupational History  . Collections    Social History Main Topics  . Smoking status: Never Smoker  . Smokeless tobacco: Never Used  . Alcohol use No  . Drug use: No  . Sexual activity: Not Currently   Other Topics Concern  . Not on file   Social History Narrative   Lives at home alone.   Right-handed.   4 cups caffeine per day.     PHYSICAL EXAM   Vitals:   05/16/17 1038  BP: (!) 143/98  Pulse: 82  Weight: 211 lb (95.7 kg)    Height: 5\' 8"  (1.727 m)    Not recorded      Body mass index is 32.08 kg/m.  PHYSICAL EXAMNIATION:  Gen: NAD, conversant, well nourised, obese, well groomed                     Cardiovascular: Regular rate rhythm, no peripheral edema, warm, nontender. Eyes: Conjunctivae clear without exudates or hemorrhage Neck: Supple, no carotid bruits. Pulmonary: Clear to auscultation bilaterally   NEUROLOGICAL EXAM:  MENTAL STATUS: Speech:    Speech is normal; fluent and spontaneous with normal comprehension.  Cognition:     Orientation to time, place and person     Normal recent and remote memory     Normal Attention span and concentration  Normal Language, naming, repeating,spontaneous speech     Fund of knowledge   CRANIAL NERVES: CN II: Visual fields are full to confrontation. Fundoscopic exam is normal with sharp discs and no vascular changes. Pupils are round equal and briskly reactive to light. CN III, IV, VI: extraocular movement are normal. No ptosis. CN V: Facial sensation is intact to pinprick in all 3 divisions bilaterally. Corneal responses are intact.  CN VII: Face is symmetric with normal eye closure and smile. CN VIII: Hearing is normal to rubbing fingers CN IX, X: Palate elevates symmetrically. Phonation is normal. CN XI: Head turning and shoulder shrug are intact CN XII: Tongue is midline with normal movements and no atrophy.  MOTOR: There is no pronator drift of out-stretched arms. Muscle bulk and tone are normal. Muscle strength is normal.  REFLEXES: Reflexes are 2+ and symmetric at the biceps, triceps, knees, and ankles. Plantar responses are flexor.  SENSORY: Intact to light touch, pinprick, positional sensation and vibratory sensation are intact in fingers and toes.  COORDINATION: Rapid alternating movements and fine finger movements are intact. There is no dysmetria on finger-to-nose and heel-knee-shin.    GAIT/STANCE: Posture is normal. Gait is  steady with normal steps, base, arm swing, and turning. Heel and toe walking are normal. Tandem gait is normal.  Romberg is absent.   DIAGNOSTIC DATA (LABS, IMAGING, TESTING) - I reviewed patient records, labs, notes, testing and imaging myself where available.   ASSESSMENT AND PLAN  Mary Bryant is a 51 y.o. female   Seizure-like event  She responded very well to Trileptal 150 mg twice a day  Left facial discomfort  MRI of the brain with and without contrast, was normal  EEG was normal  Laboratory evaluations showed mild low vitamin B12 255, vitamin B 12 supplement, Chronic migraine headaches  Imitrex 50 mg as needed   Marcial Pacas, M.D. Ph.D.  [facility] [address], [place], [place] [address] Ph: [phone] Fax: [phone]  MA:[id number], Aris Everts, MD

## 2017-05-16 NOTE — Telephone Encounter (Signed)
Pt needs a 1 yr f/u-but needs to set up a payment plan before that is scheduled. She has a very high balance.

## 2017-05-16 NOTE — Progress Notes (Signed)
Patient came in for her 1 st b12 injection.  Patient received in left deltoid, tolerated well.  Patient wanted to know if she can get a RX for the B12 and needles to give the shot at home.  Her mom is a Equities trader and can assist for convenience.  Patient requested a Accu machine to be able to check her blood sugars, gave sample machine and told her to follow up if she needs a Rx for more supplies.    Please advise, thanks

## 2017-05-23 ENCOUNTER — Telehealth: Payer: Self-pay

## 2017-05-23 DIAGNOSIS — E538 Deficiency of other specified B group vitamins: Secondary | ICD-10-CM

## 2017-05-23 NOTE — Telephone Encounter (Signed)
LMTCB

## 2017-05-23 NOTE — Telephone Encounter (Signed)
Copied from [place] [id number]. Topic: Inquiry >> May 23, 2017 10:21 AM Darl Householder, RMA wrote: Reason for CRM: patient is requesting a call back from Dr. Derrel Nip CMA concerning a prescription for B-12 injections

## 2017-05-24 ENCOUNTER — Telehealth: Payer: Self-pay

## 2017-05-24 NOTE — Telephone Encounter (Signed)
Copied from [place] #[id number]. Topic: Inquiry >> May 24, 2017  9:39 AM Malena Catholic I, NT wrote: Reason for CRM: Pt returning a phone call Please call Pt Back

## 2017-05-25 ENCOUNTER — Telehealth: Payer: Self-pay | Admitting: Internal Medicine

## 2017-05-25 NOTE — Telephone Encounter (Signed)
Copied from [place] #[id number]. Topic: Quick Communication - See Telephone Encounter >> May 25, 2017  5:14 PM Cleaster Corin, NT wrote: CRM for notification. See Telephone encounter for:   05/25/17.  Pt. Waiting on a call back from Adair Laundry please give her a call back at [phone] or Nelta Numbers who is authorized to also speak [phone]

## 2017-05-25 NOTE — Telephone Encounter (Signed)
Duplicate

## 2017-05-25 NOTE — Telephone Encounter (Signed)
LMTCB

## 2017-05-27 MED ORDER — SYRINGE 25G X 1" 3 ML MISC
0 refills | Status: DC
Start: 2017-05-27 — End: 2018-01-02

## 2017-05-27 MED ORDER — CYANOCOBALAMIN 1000 MCG/ML IJ SOLN: INTRAMUSCULAR | 2 refills | Status: DC

## 2017-05-27 NOTE — Addendum Note (Signed)
Addended by: Adair Laundry on: 05/27/2017 03:59 PM   Modules accepted: Orders

## 2017-05-27 NOTE — Telephone Encounter (Signed)
Spoke with pt and she stated that all she was needing was a rx sent in for her b12 so that she can have her mother give them to her. Per Dr. Lupita Dawn okay the rx has been sent in.

## 2017-06-24 ENCOUNTER — Other Ambulatory Visit: Payer: Self-pay | Admitting: Internal Medicine

## 2017-07-25 ENCOUNTER — Encounter: Payer: Self-pay | Admitting: Internal Medicine

## 2017-07-27 ENCOUNTER — Other Ambulatory Visit: Payer: Self-pay | Admitting: Internal Medicine

## 2017-07-27 NOTE — Telephone Encounter (Signed)
Refilled: 04/25/2017 Last OV: 05/09/2017 Next OV: 09/29/2017

## 2017-07-29 NOTE — Telephone Encounter (Signed)
Printed, signed and faxed.  

## 2017-08-15 ENCOUNTER — Ambulatory Visit: Payer: Self-pay | Admitting: Internal Medicine

## 2017-08-25 ENCOUNTER — Other Ambulatory Visit: Payer: Self-pay | Admitting: Internal Medicine

## 2017-09-10 ENCOUNTER — Other Ambulatory Visit: Payer: Self-pay | Admitting: Internal Medicine

## 2017-09-29 ENCOUNTER — Ambulatory Visit: Payer: Managed Care, Other (non HMO) | Admitting: Internal Medicine

## 2017-09-29 ENCOUNTER — Encounter: Payer: Self-pay | Admitting: Internal Medicine

## 2017-09-29 VITALS — BP 122/84 | HR 91 | Temp 98.1°F | Resp 15 | Ht 68.0 in | Wt 215.4 lb

## 2017-09-29 DIAGNOSIS — E669 Obesity, unspecified: Secondary | ICD-10-CM

## 2017-09-29 DIAGNOSIS — Z79899 Other long term (current) drug therapy: Secondary | ICD-10-CM | POA: Diagnosis not present

## 2017-09-29 DIAGNOSIS — R7303 Prediabetes: Secondary | ICD-10-CM | POA: Diagnosis not present

## 2017-09-29 DIAGNOSIS — E538 Deficiency of other specified B group vitamins: Secondary | ICD-10-CM

## 2017-09-29 LAB — COMPREHENSIVE METABOLIC PANEL
ALK PHOS: 91 U/L (ref 39–117)
ALT: 22 U/L (ref 0–35)
AST: 23 U/L (ref 0–37)
Albumin: 4.3 g/dL (ref 3.5–5.2)
BILIRUBIN TOTAL: 0.2 mg/dL (ref 0.2–1.2)
BUN: 9 mg/dL (ref 6–23)
CALCIUM: 10.1 mg/dL (ref 8.4–10.5)
CO2: 27 mEq/L (ref 19–32)
CREATININE: 0.98 mg/dL (ref 0.40–1.20)
Chloride: 105 mEq/L (ref 96–112)
GFR: 63.79 mL/min (ref >60.00)
GLUCOSE: 99 mg/dL (ref 70–99)
Potassium: 4.4 mEq/L (ref 3.5–5.1)
Sodium: 140 mEq/L (ref 135–145)
Total Protein: 7.6 g/dL (ref 6.0–8.3)

## 2017-09-29 LAB — LIPID PANEL
CHOLESTEROL: 169 mg/dL (ref 0–200)
HDL: 61 mg/dL (ref >39.00)
LDL Cholesterol: 94 mg/dL (ref 0–99)
NonHDL: 108.05
Total CHOL/HDL Ratio: 3
Triglycerides: 71 mg/dL (ref 0.0–149.0)
VLDL: 14.2 mg/dL (ref 0.0–40.0)

## 2017-09-29 LAB — TSH: TSH: 2.46 u[IU]/mL (ref 0.35–4.50)

## 2017-09-29 LAB — T4, FREE: FREE T4: 0.68 ng/dL (ref 0.60–1.60)

## 2017-09-29 LAB — VITAMIN B12: VITAMIN B 12: 342 pg/mL (ref 211–911)

## 2017-09-29 LAB — HEMOGLOBIN A1C: Hgb A1c MFr Bld: 6.3 % (ref 4.6–6.5)

## 2017-09-29 MED ORDER — GABAPENTIN 100 MG PO CAPS: 100.0000 mg | ORAL_CAPSULE | Freq: Three times a day (TID) | ORAL | 0 refills | Status: DC

## 2017-09-29 NOTE — Patient Instructions (Addendum)
Reduce your alprazolam to 1/2 tablet daily for two weeks,  Then 1/2 tablet every other day for two weeks,  then stop  Reduce lexapro to 1/2 tablet lexapro daily for one week,  Then stop   We can resume an alternative medication with wellbutrin if you feel you need an antidepressant   Reduce gabapentin to 100  Mg 3 times daily   CHANGE YOUR EXERCISE ROUTINE DAILY .  ADD RUNNING,  PUSH UPS,  LOW WEIGHTS , SQUATS  CONSIDER HIRING A PERSONAL TRAINER.

## 2017-09-29 NOTE — Progress Notes (Signed)
Subjective:  Patient ID: Mary Bryant, female    DOB: May 16, 1966  Age: 52 y.o. MRN: [id number]  CC: The primary encounter diagnosis was Long-term use of high-risk medication. Diagnoses of Obesity (BMI 30.0-34.9), B12 deficiency, and Prediabetes were also pertinent to this visit.  HPI Mary Bryant presents for evaluation and management of weight gain.  She is frustrated by her inability to lose weight despite altering her diet and starting a regular exercise program.  She has gained 4 lbs since October . Nadir was 179 lbs in Jan 2015  Thyroid function was normal 4 months ago and historically,  TSH 1.8  Joined weight watchers end of NOvember.  21 pts daily .  Not going over.  Lost 8 lbs  In January ,  Then gained 12 lbs back during the following month when she was helping Mother recover from medical issues including shoulder surgery .  Physically active.  Using the elliptical 5 days per week for at least an hour. Breathing hard during exercise regimen.   Not losing inches.    Diet reviewed:  Breakfast:  [place] non fat yogurt vanilla with fresh fruit (oranges, some apples)  Lunch: chicken breast 4 -6 oz , salad , no croutons.  Another yogurt  Snacks midday is fruit Dinner:  Occasional serving of white rice ,   Always chicken,  Green beans or other vegetable   No chips,  No soda,  Drinks unsweet tea and water.    Uses lexapro for depression.  Uses xanax for hot flashes.   Outpatient Medications Prior to Visit  Medication Sig Dispense Refill  . acetaminophen (TYLENOL) 500 MG tablet Take 1,000 mg by mouth every 6 (six) hours as needed for mild pain.    Marland Kitchen acyclovir ointment (ZOVIRAX) 5 % Apply 1 application topically 2 (two) times daily. As needed for fever blisters 14 g 3  . ALPRAZolam (XANAX) 0.5 MG tablet TAKE 1 TABLET BY MOUTH AS DIRECTED 30 tablet 2  . cyanocobalamin (,VITAMIN B-12,) 1000 MCG/ML injection Inject 1,000mg (57ml) into muscle once weekly for 3 weeks. 1 mL 2  .  escitalopram (LEXAPRO) 10 MG tablet TAKE 1 TABLET (10 MG TOTAL) BY MOUTH DAILY. 90 tablet 0  . OXcarbazepine (TRILEPTAL) 150 MG tablet Take 1 tablet (150 mg total) by mouth 2 (two) times daily. 60 tablet 11  . pantoprazole (PROTONIX) 40 MG tablet Take 1 tablet (40 mg total) by mouth daily. REPORTS TAKING 2 TABLET BY MOUTH DAILY 30 tablet 11  . spironolactone (ALDACTONE) 50 MG tablet TAKE 1 TABLET (50 MG TOTAL) BY MOUTH DAILY. AS NEEDED FOR FLUID RETENTION 30 tablet 2  . SUMAtriptan (IMITREX) 50 MG tablet Take 1 tablet (50 mg total) by mouth every 2 (two) hours as needed for migraine. May repeat in 2 hours if headache persists or recurs. 15 tablet 11  . Syringe/Needle, Disp, (SYRINGE 3CC/25GX1") 25G X 1" 3 ML MISC Use for b12 injections. 3 each 0  . gabapentin (NEURONTIN) 300 MG capsule Take 1 capsule (300 mg total) by mouth 3 (three) times daily. 90 capsule 11   Facility-Administered Medications Prior to Visit  Medication Dose Route Frequency Provider Last Rate Last Dose  . gadopentetate dimeglumine (MAGNEVIST) injection 20 mL  20 mL Intravenous Once PRN Marcial Pacas, MD        Review of Systems;  Patient denies headache, fevers, malaise, unintentional weight loss, skin rash, eye pain, sinus congestion and sinus pain, sore throat, dysphagia,  hemoptysis , cough, dyspnea, wheezing,  chest pain, palpitations, orthopnea, edema, abdominal pain, nausea, melena, diarrhea, constipation, flank pain, dysuria, hematuria, urinary  Frequency, nocturia, numbness, tingling, seizures,  Focal weakness, Loss of consciousness,  Tremor, insomnia, depression, anxiety, and suicidal ideation.      Objective:  BP 122/84 (BP Location: Left Arm, Patient Position: Sitting, Cuff Size: Large)   Pulse 91   Temp 98.1 F (36.7 C) (Oral)   Resp 15   Ht 5\' 8"  (1.727 m)   Wt 215 lb 6.4 oz (97.7 kg)   SpO2 96%   BMI 32.75 kg/m   BP Readings from Last 3 Encounters:  09/29/17 122/84  05/16/17 (!) 143/98  05/12/17 138/78      Wt Readings from Last 3 Encounters:  09/29/17 215 lb 6.4 oz (97.7 kg)  05/16/17 211 lb (95.7 kg)  05/09/17 212 lb (96.2 kg)    General appearance: alert, cooperative and appears stated age Ears: normal TM's and external ear canals both ears Throat: lips, mucosa, and tongue normal; teeth and gums normal Neck: no adenopathy, no carotid bruit, supple, symmetrical, trachea midline and thyroid not enlarged, symmetric, no tenderness/mass/nodules Back: symmetric, no curvature. ROM normal. No CVA tenderness. Lungs: clear to auscultation bilaterally Heart: regular rate and rhythm, S1, S2 normal, no murmur, click, rub or gallop Abdomen: soft, non-tender; bowel sounds normal; no masses,  no organomegaly Pulses: 2+ and symmetric Skin: Skin color, texture, turgor normal. No rashes or lesions Lymph nodes: Cervical, supraclavicular, and axillary nodes normal.  Lab Results  Component Value Date   HGBA1C 6.3 09/29/2017   HGBA1C 6.0 05/09/2017   HGBA1C 5.7 05/19/2011    Lab Results  Component Value Date   CREATININE 0.98 09/29/2017   CREATININE 0.96 05/12/2017   CREATININE 0.94 05/09/2017    Lab Results  Component Value Date   WBC 8.6 05/12/2017   HGB 12.9 05/12/2017   HCT 38.7 05/12/2017   PLT 343 05/12/2017   GLUCOSE 99 09/29/2017   CHOL 169 09/29/2017   TRIG 71.0 09/29/2017   HDL 61.00 09/29/2017   LDLCALC 94 09/29/2017   ALT 22 09/29/2017   AST 23 09/29/2017   NA 140 09/29/2017   K 4.4 09/29/2017   CL 105 09/29/2017   CREATININE 0.98 09/29/2017   BUN 9 09/29/2017   CO2 27 09/29/2017   TSH 2.46 09/29/2017   HGBA1C 6.3 09/29/2017   MICROALBUR 1.4 05/08/2012    Ct Head Wo Contrast  Result Date: 05/12/2017 CLINICAL DATA:  Sudden onset of weakness and presyncope. Headache. Hypertension. Cardiac arrhythmia. EXAM: CT HEAD WITHOUT CONTRAST TECHNIQUE: Contiguous axial images were obtained from the base of the skull through the vertex without intravenous contrast. COMPARISON:   MR brain 02/23/2017.    CT head 12/13/2014. FINDINGS: Brain: No evidence for acute infarction, hemorrhage, mass lesion, hydrocephalus, or extra-axial fluid. Normal cerebral volume. Minor white matter disease. Vascular: No signs of proximal large vessel occlusion. Skull: Calvarium intact.  Falx ossification. Sinuses/Orbits: No layering fluid.  Negative appearing orbits. Other: None. IMPRESSION: Unremarkable CT head without contrast. No change from prior CT and MR imaging studies. Electronically Signed   By: Staci Righter M.D.   On: 05/12/2017 13:33    Assessment & Plan:   Problem List Items Addressed This Visit    Long-term use of high-risk medication - Primary   Relevant Orders   Comprehensive metabolic panel (Completed)   Obesity (BMI 30.0-34.9)    Diet and exercise reviewed and there appear to be no major red flags.  She requests suspension  of medications that may be contributing to her weight gain.  Lexapro, alprazolam and gabapentin taper all outlined today .screening labs indicate prediabetes,  Normal thyroid function .  RTC 3 months  Lab Results  Component Value Date   TSH 2.46 09/29/2017   Lab Results  Component Value Date   HGBA1C 6.3 09/29/2017         Relevant Orders   TSH (Completed)   T4, free (Completed)   Lipid panel (Completed)   Hemoglobin A1c (Completed)   Prediabetes    Her fasting glucose is not elevated, but her A1c continues to rise suggesting she is at risk for type 2 diabetes.  I recommend he follow a low glycemic index diet and hire a personal trainer to guide her exercise efforts.   We should check an A1c in 3 months.   Lab Results  Component Value Date   HGBA1C 6.3 09/29/2017          Other Visit Diagnoses    B12 deficiency       Relevant Orders   B12 (Completed)      I have changed Mary Bryant's gabapentin. I am also having her maintain her acetaminophen, acyclovir ointment, OXcarbazepine, pantoprazole, SUMAtriptan, cyanocobalamin,  SYRINGE 3CC/25GX1", ALPRAZolam, escitalopram, and spironolactone.  Meds ordered this encounter  Medications  . gabapentin (NEURONTIN) 100 MG capsule    Sig: Take 1 capsule (100 mg total) by mouth 3 (three) times daily.    Dispense:  90 capsule    Refill:  0    Medications Discontinued During This Encounter  Medication Reason  . gabapentin (NEURONTIN) 300 MG capsule     Follow-up: Return in about 3 months (around 12/30/2017) for FOLLOW UP ON WEIGHT LOSS DEPRESSION .   Crecencio Mc, MD

## 2017-10-01 DIAGNOSIS — E119 Type 2 diabetes mellitus without complications: Secondary | ICD-10-CM | POA: Insufficient documentation

## 2017-10-01 DIAGNOSIS — I152 Hypertension secondary to endocrine disorders: Secondary | ICD-10-CM | POA: Insufficient documentation

## 2017-10-01 DIAGNOSIS — E669 Obesity, unspecified: Secondary | ICD-10-CM | POA: Insufficient documentation

## 2017-10-01 NOTE — Assessment & Plan Note (Addendum)
Her fasting glucose is not elevated, but her A1c continues to rise suggesting she is at risk for type 2 diabetes.  I recommend he follow a low glycemic index diet and hire a personal trainer to guide her exercise efforts.   We should check an A1c in 3 months and consider trial of metformin or Saxenda if she has not lost 12 lbs or lowered her a1c .   Lab Results  Component Value Date   HGBA1C 6.3 09/29/2017

## 2017-10-01 NOTE — Assessment & Plan Note (Addendum)
Diet and exercise reviewed and there appear to be no major red flags.  She requests suspension of medications that may be contributing to her weight gain.  Lexapro, alprazolam and gabapentin taper all outlined today .screening labs indicate prediabetes,  Normal thyroid function .  RTC 3 months  Lab Results  Component Value Date   TSH 2.46 09/29/2017   Lab Results  Component Value Date   HGBA1C 6.3 09/29/2017

## 2017-10-28 ENCOUNTER — Other Ambulatory Visit: Payer: Self-pay | Admitting: Internal Medicine

## 2017-11-04 ENCOUNTER — Other Ambulatory Visit: Payer: Self-pay | Admitting: Internal Medicine

## 2017-12-03 ENCOUNTER — Other Ambulatory Visit: Payer: Self-pay | Admitting: Internal Medicine

## 2017-12-05 ENCOUNTER — Other Ambulatory Visit: Payer: Self-pay | Admitting: Internal Medicine

## 2017-12-08 NOTE — Telephone Encounter (Signed)
Printed, signed and faxed.  

## 2017-12-08 NOTE — Telephone Encounter (Signed)
Refilled: 07/29/2017 Last OV: 09/29/2017 Next OV: 01/02/2018

## 2017-12-30 ENCOUNTER — Ambulatory Visit: Payer: Self-pay | Admitting: Internal Medicine

## 2017-12-30 ENCOUNTER — Other Ambulatory Visit: Payer: Self-pay | Admitting: Neurology

## 2017-12-30 ENCOUNTER — Other Ambulatory Visit: Payer: Self-pay | Admitting: Internal Medicine

## 2018-01-02 ENCOUNTER — Ambulatory Visit: Payer: Managed Care, Other (non HMO) | Admitting: Internal Medicine

## 2018-01-02 ENCOUNTER — Encounter: Payer: Self-pay | Admitting: Internal Medicine

## 2018-01-02 VITALS — BP 142/86 | HR 102 | Temp 98.6°F | Resp 15 | Ht 68.0 in | Wt 218.8 lb

## 2018-01-02 DIAGNOSIS — E538 Deficiency of other specified B group vitamins: Secondary | ICD-10-CM

## 2018-01-02 DIAGNOSIS — R5382 Chronic fatigue, unspecified: Secondary | ICD-10-CM

## 2018-01-02 DIAGNOSIS — R0683 Snoring: Secondary | ICD-10-CM

## 2018-01-02 DIAGNOSIS — R7303 Prediabetes: Secondary | ICD-10-CM

## 2018-01-02 DIAGNOSIS — F411 Generalized anxiety disorder: Secondary | ICD-10-CM

## 2018-01-02 DIAGNOSIS — E669 Obesity, unspecified: Secondary | ICD-10-CM | POA: Diagnosis not present

## 2018-01-02 NOTE — Patient Instructions (Addendum)
I recommend seeing an obesity expert  Dr. Leafy Ro   Sleep study ordered to investigate your snoring and fatigue

## 2018-01-02 NOTE — Progress Notes (Addendum)
Subjective:  Patient ID: Mary Bryant, female    DOB: January 03, 1966  Age: 52 y.o. MRN: [id number]  CC: The primary encounter diagnosis was Snoring. Diagnoses of Obesity (BMI 30.0-34.9), Chronic fatigue, Prediabetes, GAD (generalized anxiety disorder), and B12 deficiency were also pertinent to this visit.  HPI Mary Bryant presents for follow up on management of obesity .She was last seen in march and medications were discontinued (gabapentin, lexapro and alprazolam).  Low GI det and regular participation in exercise was advised.  She has not lost any weight since then and is quite frustrated since she reports to be following all instructions.  She is exercising daily using a home ellipitical machine 30  minutes twice daily 7 days  Per week.  Diet :  Eats an egg in the am,  no bacon .  Eats a salad with grilled protein  For lunch   Supper is a meat with 2 vegetables.  Says she is not snacking. Marland Kitchen  Has reduced sodas to once a week.  No chips ,occasional saltines to treat acid reflux  Symptoms  Lab Results  Component Value Date   HGBA1C 6.6 (H) 01/02/2018   Taking protonix once daily. Occasionally 2 time daily for persistent  symptoms. GI workup reviewed.  Was referred by [facility] GI surgery to GI for further workup after no surgery for small hiatal hernia was recommended.  No  etiology found for her multiple GI symptoms   And GI had nothing futher to add other than to  "continue PPI,  Follow GERD diet"   Lab Results  Component Value Date   TSH 2.23 01/02/2018      Outpatient Medications Prior to Visit  Medication Sig Dispense Refill  . acetaminophen (TYLENOL) 500 MG tablet Take 1,000 mg by mouth every 6 (six) hours as needed for mild pain.    Marland Kitchen acyclovir ointment (ZOVIRAX) 5 % Apply 1 application topically 2 (two) times daily. As needed for fever blisters 14 g 3  . OXcarbazepine (TRILEPTAL) 150 MG tablet TAKE 1 TABLET BY MOUTH TWICE A DAY 60 tablet 4  . pantoprazole (PROTONIX) 40 MG  tablet Take 1 tablet (40 mg total) by mouth daily. REPORTS TAKING 2 TABLET BY MOUTH DAILY 30 tablet 11  . spironolactone (ALDACTONE) 50 MG tablet TAKE 1 TABLET (50 MG TOTAL) BY MOUTH DAILY. AS NEEDED FOR FLUID RETENTION 30 tablet 5  . ALPRAZolam (XANAX) 0.5 MG tablet TAKE 1 TABLET BY MOUTH AS DIRECTED 30 tablet 2  . escitalopram (LEXAPRO) 10 MG tablet TAKE 1 TABLET (10 MG TOTAL) BY MOUTH DAILY. 90 tablet 0  . gabapentin (NEURONTIN) 100 MG capsule TAKE 1 CAPSULE BY MOUTH THREE TIMES A DAY 90 capsule 1  . cyanocobalamin (,VITAMIN B-12,) 1000 MCG/ML injection Inject 1,000mg (42ml) into muscle once weekly for 3 weeks. (Patient not taking: Reported on 01/02/2018) 1 mL 2  . SUMAtriptan (IMITREX) 50 MG tablet Take 1 tablet (50 mg total) by mouth every 2 (two) hours as needed for migraine. May repeat in 2 hours if headache persists or recurs. (Patient not taking: Reported on 01/02/2018) 15 tablet 11  . Syringe/Needle, Disp, (SYRINGE 3CC/25GX1") 25G X 1" 3 ML MISC Use for b12 injections. (Patient not taking: Reported on 01/02/2018) 3 each 0   Facility-Administered Medications Prior to Visit  Medication Dose Route Frequency Provider Last Rate Last Dose  . gadopentetate dimeglumine (MAGNEVIST) injection 20 mL  20 mL Intravenous Once PRN Marcial Pacas, MD        Review  of Systems;  Patient denies headache, fevers, malaise, unintentional weight loss, skin rash, eye pain, sinus congestion and sinus pain, sore throat, dysphagia,  hemoptysis , cough, dyspnea, wheezing, chest pain, palpitations, orthopnea, edema, abdominal pain, nausea, melena, diarrhea, constipation, flank pain, dysuria, hematuria, urinary  Frequency, nocturia, numbness, tingling, seizures,  Focal weakness, Loss of consciousness,  Tremor, insomnia, depression, anxiety, and suicidal ideation.      Objective:  BP (!) 142/86 (BP Location: Left Arm, Patient Position: Sitting, Cuff Size: Normal)   Pulse (!) 102   Temp 98.6 F (37 C) (Oral)   Resp 15    Ht 5\' 8"  (1.727 m)   Wt 218 lb 12.8 oz (99.2 kg)   SpO2 95%   BMI 33.27 kg/m   BP Readings from Last 3 Encounters:  01/02/18 (!) 142/86  09/29/17 122/84  05/16/17 (!) 143/98    Wt Readings from Last 3 Encounters:  01/02/18 218 lb 12.8 oz (99.2 kg)  09/29/17 215 lb 6.4 oz (97.7 kg)  05/16/17 211 lb (95.7 kg)    General appearance: alert, cooperative and appears stated age Ears: normal TM's and external ear canals both ears Throat: lips, mucosa, and tongue normal; teeth and gums normal Neck: no adenopathy, no carotid bruit, supple, symmetrical, trachea midline and thyroid not enlarged, symmetric, no tenderness/mass/nodules Back: symmetric, no curvature. ROM normal. No CVA tenderness. Lungs: clear to auscultation bilaterally Heart: regular rate and rhythm, S1, S2 normal, no murmur, click, rub or gallop Abdomen: soft, non-tender; bowel sounds normal; no masses,  no organomegaly Pulses: 2+ and symmetric Skin: Skin color, texture, turgor normal. No rashes or lesions Lymph nodes: Cervical, supraclavicular, and axillary nodes normal.  Lab Results  Component Value Date   HGBA1C 6.6 (H) 01/02/2018   HGBA1C 6.3 09/29/2017   HGBA1C 6.0 05/09/2017    Lab Results  Component Value Date   CREATININE 0.98 09/29/2017   CREATININE 0.96 05/12/2017   CREATININE 0.94 05/09/2017    Lab Results  Component Value Date   WBC 8.6 05/12/2017   HGB 12.9 05/12/2017   HCT 38.7 05/12/2017   PLT 343 05/12/2017   GLUCOSE 99 09/29/2017   CHOL 169 09/29/2017   TRIG 71.0 09/29/2017   HDL 61.00 09/29/2017   LDLCALC 94 09/29/2017   ALT 22 09/29/2017   AST 23 09/29/2017   NA 140 09/29/2017   K 4.4 09/29/2017   CL 105 09/29/2017   CREATININE 0.98 09/29/2017   BUN 9 09/29/2017   CO2 27 09/29/2017   TSH 2.23 01/02/2018   HGBA1C 6.6 (H) 01/02/2018   MICROALBUR 1.4 05/08/2012    Ct Head Wo Contrast  Result Date: 05/12/2017 CLINICAL DATA:  Sudden onset of weakness and presyncope. Headache.  Hypertension. Cardiac arrhythmia. EXAM: CT HEAD WITHOUT CONTRAST TECHNIQUE: Contiguous axial images were obtained from the base of the skull through the vertex without intravenous contrast. COMPARISON:  MR brain 02/23/2017.    CT head 12/13/2014. FINDINGS: Brain: No evidence for acute infarction, hemorrhage, mass lesion, hydrocephalus, or extra-axial fluid. Normal cerebral volume. Minor white matter disease. Vascular: No signs of proximal large vessel occlusion. Skull: Calvarium intact.  Falx ossification. Sinuses/Orbits: No layering fluid.  Negative appearing orbits. Other: None. IMPRESSION: Unremarkable CT head without contrast. No change from prior CT and MR imaging studies. Electronically Signed   By: Staci Righter M.D.   On: 05/12/2017 13:33    Assessment & Plan:   Problem List Items Addressed This Visit    Snoring - Primary    Multipile.  Risk factors for OSA including obesity , snoring and headaches  Sleep study ordered       Relevant Orders   Ambulatory referral to Sleep Studies   Obesity (BMI 30.0-34.9)    She is gaining weight despite reportedly complying with low GI diet and regular daily exercise. Rechecking thyroid function.  If normal referring to Dr Leafy Ro   Lab Results  Component Value Date   TSH 2.46 09/29/2017         Relevant Orders   Ambulatory referral to Sleep Studies   Amb Ref to Medical Weight Management   GAD (generalized anxiety disorder)    Her anxiety does not appear to have returned despite stopping lexapro and alprazolam       Diabetes mellitus without complication ([place])    Her fasting glucose is not elevated, but her A1c is now confirmatory for  type 2 diabetes.  I recommend he follow a low glycemic index diet and hire a personal trainer to guide her exercise efforts.   Advised her to have a trial of metformin  Lab Results  Component Value Date   HGBA1C 6.6 (H) 01/02/2018         Chronic fatigue    checking serologies for thyroid iron,.  She is  supplementing her  B12 but her level is still low.   Lab Results  Component Value Date   VITAMINB12 186 (L) 01/02/2018         Relevant Orders   TSH (Completed)   T4, free (Completed)   Iron, TIBC and Ferritin Panel (Completed)   Vitamin B12 (Completed)   B12 deficiency    Etiology unclear,  New onset.  Acid suppression may be cause.  checking intrinsic factor ab       Relevant Orders   Intrinsic Factor Antibodies     A total of 40 minutes of face to face time was spent with patient more than half of which was spent in counselling about her diet and coordination of care  I have discontinued Ayva S. Dancy's SUMAtriptan, cyanocobalamin, SYRINGE 3CC/25GX1", escitalopram, and ALPRAZolam. I am also having her maintain her acetaminophen, acyclovir ointment, pantoprazole, spironolactone, and OXcarbazepine.  No orders of the defined types were placed in this encounter.   Medications Discontinued During This Encounter  Medication Reason  . cyanocobalamin (,VITAMIN B-12,) 1000 MCG/ML injection Patient has not taken in last 30 days  . SUMAtriptan (IMITREX) 50 MG tablet Patient has not taken in last 30 days  . Syringe/Needle, Disp, (SYRINGE 3CC/25GX1") 25G X 1" 3 ML MISC Patient has not taken in last 30 days  . escitalopram (LEXAPRO) 10 MG tablet   . ALPRAZolam (XANAX) 0.5 MG tablet     Follow-up: No follow-ups on file.   Crecencio Mc, MD

## 2018-01-03 ENCOUNTER — Other Ambulatory Visit: Payer: Self-pay | Admitting: Internal Medicine

## 2018-01-03 ENCOUNTER — Telehealth: Payer: Self-pay | Admitting: Internal Medicine

## 2018-01-03 DIAGNOSIS — R0683 Snoring: Secondary | ICD-10-CM | POA: Insufficient documentation

## 2018-01-03 DIAGNOSIS — R5382 Chronic fatigue, unspecified: Secondary | ICD-10-CM | POA: Insufficient documentation

## 2018-01-03 DIAGNOSIS — E538 Deficiency of other specified B group vitamins: Secondary | ICD-10-CM | POA: Insufficient documentation

## 2018-01-03 LAB — IRON,TIBC AND FERRITIN PANEL
%SAT: 9 % (calc) — ABNORMAL LOW (ref 16–45)
Ferritin: 20 ng/mL (ref 16–232)
Iron: 39 ug/dL — ABNORMAL LOW (ref 45–160)
TIBC: 453 mcg/dL (calc) — ABNORMAL HIGH (ref 250–450)

## 2018-01-03 LAB — VITAMIN B12: VITAMIN B 12: 186 pg/mL — AB (ref 211–911)

## 2018-01-03 LAB — HEMOGLOBIN A1C: HEMOGLOBIN A1C: 6.6 % — AB (ref 4.6–6.5)

## 2018-01-03 LAB — TSH: TSH: 2.23 u[IU]/mL (ref 0.35–4.50)

## 2018-01-03 LAB — T4, FREE: Free T4: 0.7 ng/dL (ref 0.60–1.60)

## 2018-01-03 MED ORDER — METFORMIN HCL 500 MG PO TABS: 500.0000 mg | ORAL_TABLET | Freq: Two times a day (BID) | ORAL | 3 refills | Status: DC

## 2018-01-03 NOTE — Assessment & Plan Note (Signed)
Multipile. Risk factors for OSA including obesity , snoring and headaches  Sleep study ordered

## 2018-01-03 NOTE — Telephone Encounter (Unsigned)
Copied from [id number]. Topic: Quick Communication - See Telephone Encounter >> Jan 03, 2018 12:13 PM Hewitt Shorts wrote: Pt is responding back to Dr. Derrel Nip about her mychart questions.  She does want her to call in the metformin  and also call in the rx for the b12 injections pt sates she can give those to herself and that she  Will get the iron over the counter   [facility] number [phone]

## 2018-01-03 NOTE — Assessment & Plan Note (Signed)
Her anxiety does not appear to have returned despite stopping lexapro and alprazolam

## 2018-01-03 NOTE — Assessment & Plan Note (Addendum)
Her fasting glucose is not elevated, but her A1c is now confirmatory for  type 2 diabetes.  I recommend he follow a low glycemic index diet and hire a personal trainer to guide her exercise efforts.   Advised her to have a trial of metformin  Lab Results  Component Value Date   HGBA1C 6.6 (H) 01/02/2018

## 2018-01-03 NOTE — Assessment & Plan Note (Addendum)
checking serologies for thyroid iron,.  She is supplementing her  B12 but her level is still low.   Lab Results  Component Value Date   VITAMINB12 186 (L) 01/02/2018

## 2018-01-03 NOTE — Assessment & Plan Note (Signed)
She is gaining weight despite reportedly complying with low GI diet and regular daily exercise. Rechecking thyroid function.  If normal referring to Dr Leafy Ro   Lab Results  Component Value Date   TSH 2.46 09/29/2017

## 2018-01-03 NOTE — Progress Notes (Unsigned)
etformin

## 2018-01-03 NOTE — Addendum Note (Signed)
Addended by: Crecencio Mc on: 01/03/2018 12:02 PM   Modules accepted: Orders, Level of Service

## 2018-01-03 NOTE — Assessment & Plan Note (Signed)
Etiology unclear,  New onset.  Acid suppression may be cause.  checking intrinsic factor ab

## 2018-01-04 ENCOUNTER — Other Ambulatory Visit (INDEPENDENT_AMBULATORY_CARE_PROVIDER_SITE_OTHER): Payer: Managed Care, Other (non HMO)

## 2018-01-04 ENCOUNTER — Telehealth: Payer: Self-pay | Admitting: Internal Medicine

## 2018-01-04 DIAGNOSIS — E538 Deficiency of other specified B group vitamins: Secondary | ICD-10-CM | POA: Diagnosis not present

## 2018-01-04 MED ORDER — "SYRINGE 25G X 1"" 3 ML MISC": 0 refills | Status: DC

## 2018-01-04 MED ORDER — CYANOCOBALAMIN 1000 MCG/ML IJ SOLN: INTRAMUSCULAR | 1 refills | Status: DC

## 2018-01-04 NOTE — Telephone Encounter (Signed)
Pt said that Dr. Derrel Nip has not call in the B12 to her pharmacy. Pt would like that called in.

## 2018-01-04 NOTE — Telephone Encounter (Signed)
For Dr. Derrel Nip see prior message

## 2018-01-04 NOTE — Telephone Encounter (Signed)
Spoke with pt and informed her that the b12 injections were sent to her pharmacy today. Pt stated that she just received a text from the pharmacy stating that they had the rx.

## 2018-01-04 NOTE — Telephone Encounter (Signed)
[facility] .  Metformin sent ydsterday

## 2018-01-06 LAB — INTRINSIC FACTOR ANTIBODIES: Intrinsic Factor: NEGATIVE

## 2018-01-15 ENCOUNTER — Other Ambulatory Visit: Payer: Self-pay | Admitting: Internal Medicine

## 2018-02-28 LAB — HM DIABETES EYE EXAM

## 2018-03-27 ENCOUNTER — Other Ambulatory Visit: Payer: Self-pay | Admitting: Internal Medicine

## 2018-04-10 ENCOUNTER — Telehealth: Payer: Self-pay | Admitting: Internal Medicine

## 2018-04-11 NOTE — Telephone Encounter (Signed)
Agree   She stopped it prior to las visit,  Needs appt to resume

## 2018-04-11 NOTE — Telephone Encounter (Signed)
Looks like this medication was discontinued at her last office visit on 01/02/2018.

## 2018-04-13 NOTE — Telephone Encounter (Signed)
Spoke with pt and was able to schedule her an appt in October. The pt stated that it is fine to wait until then and she will discuss the restart of the medication with Dr. Derrel Nip at that time.

## 2018-04-13 NOTE — Telephone Encounter (Signed)
Pt returning call to office. Please call back.

## 2018-04-13 NOTE — Telephone Encounter (Signed)
Patient returned call to office 

## 2018-04-13 NOTE — Telephone Encounter (Signed)
LMTCB. Please transfer pt to our office.  

## 2018-04-18 NOTE — Telephone Encounter (Signed)
° °  Pt said she was returning [place] call not able to get through to the office

## 2018-05-05 ENCOUNTER — Ambulatory Visit: Payer: Managed Care, Other (non HMO) | Admitting: Internal Medicine

## 2018-05-05 ENCOUNTER — Encounter: Payer: Self-pay | Admitting: Internal Medicine

## 2018-05-05 VITALS — BP 132/90 | HR 71 | Temp 98.4°F | Resp 15 | Ht 68.0 in | Wt 203.0 lb

## 2018-05-05 DIAGNOSIS — E66811 Obesity, class 1: Secondary | ICD-10-CM

## 2018-05-05 DIAGNOSIS — E669 Obesity, unspecified: Secondary | ICD-10-CM

## 2018-05-05 DIAGNOSIS — E611 Iron deficiency: Secondary | ICD-10-CM

## 2018-05-05 DIAGNOSIS — E119 Type 2 diabetes mellitus without complications: Secondary | ICD-10-CM

## 2018-05-05 DIAGNOSIS — R0683 Snoring: Secondary | ICD-10-CM

## 2018-05-05 DIAGNOSIS — R5382 Chronic fatigue, unspecified: Secondary | ICD-10-CM | POA: Diagnosis not present

## 2018-05-05 LAB — HEMOGLOBIN A1C: HEMOGLOBIN A1C: 6.1 % (ref 4.6–6.5)

## 2018-05-05 NOTE — Patient Instructions (Addendum)
You look fantastic!   Keep doing EVERYTHING you are doing!   Try the SOLA low carb bread ([employer]  Frozen bread )  3 g/slice  Tastes great!

## 2018-05-05 NOTE — Progress Notes (Signed)
Subjective:  Patient ID: Mary Bryant, female    DOB: [birth year]  Age: 52 y.o. MRN: [id number]  CC: The primary encounter diagnosis was Diabetes mellitus without complication ([place]). Diagnoses of Iron deficiency, Chronic fatigue, Obesity (BMI 30.0-34.9), and Snoring were also pertinent to this visit.  HPI Mary Bryant presents for 3 month follow up on fatigue, obesity and snoring  noted at last visit.  sleep study was  ordered  And screening labs did not suggest anemia or thyroid issues.   She Has lost 15 ls since last visit  Through strict adherence to a low glycemic index diet and regular exercise .  She recently finished running a  5 K in 49 minutes).  She reports improved energy level and improved daytime  awakness but continues to have poor sleep .  She has not had sleep study yet.  Had an EEG in [place] done since last visit that was reportedly  Normal    Outpatient Medications Prior to Visit  Medication Sig Dispense Refill  . acetaminophen (TYLENOL) 500 MG tablet Take 1,000 mg by mouth every 6 (six) hours as needed for mild pain.    Marland Kitchen acyclovir ointment (ZOVIRAX) 5 % Apply 1 application topically 2 (two) times daily. As needed for fever blisters 14 g 3  . ALPRAZolam (XANAX) 0.5 MG tablet Take 0.5 mg by mouth See admin instructions.  2  . cyanocobalamin (,VITAMIN B-12,) 1000 MCG/ML injection Inject 1000 mcg IM weekly x 3,  Then monthly thereafter 10 mL 1  . gabapentin (NEURONTIN) 100 MG capsule TAKE 1 CAPSULE BY MOUTH THREE TIMES A DAY 90 capsule 1  . metFORMIN (GLUCOPHAGE) 500 MG tablet Take 1 tablet (500 mg total) by mouth 2 (two) times daily with a meal. 180 tablet 3  . OXcarbazepine (TRILEPTAL) 150 MG tablet TAKE 1 TABLET BY MOUTH TWICE A DAY 60 tablet 4  . pantoprazole (PROTONIX) 40 MG tablet Take 1 tablet (40 mg total) by mouth daily. REPORTS TAKING 2 TABLET BY MOUTH DAILY 30 tablet 11  . spironolactone (ALDACTONE) 50 MG tablet TAKE 1 TABLET (50 MG TOTAL) BY MOUTH DAILY. AS  NEEDED FOR FLUID RETENTION 30 tablet 5  . Syringe/Needle, Disp, (SYRINGE 3CC/25GX1") 25G X 1" 3 ML MISC Use for b12 injections 50 each 0   Facility-Administered Medications Prior to Visit  Medication Dose Route Frequency Provider Last Rate Last Dose  . gadopentetate dimeglumine (MAGNEVIST) injection 20 mL  20 mL Intravenous Once PRN Marcial Pacas, MD        Review of Systems;  Patient denies headache, fevers, malaise, unintentional weight loss, skin rash, eye pain, sinus congestion and sinus pain, sore throat, dysphagia,  hemoptysis , cough, dyspnea, wheezing, chest pain, palpitations, orthopnea, edema, abdominal pain, nausea, melena, diarrhea, constipation, flank pain, dysuria, hematuria, urinary  Frequency, nocturia, numbness, tingling, seizures,  Focal weakness, Loss of consciousness,  Tremor, insomnia, depression, anxiety, and suicidal ideation.      Objective:  BP 132/90 (BP Location: Left Arm, Patient Position: Sitting, Cuff Size: Normal)   Pulse 71   Temp 98.4 F (36.9 C) (Oral)   Resp 15   Ht _0  (1.727 m)   Wt 203 lb (92.1 kg)   SpO2 97%   BMI 30.87 kg/m   BP Readings from Last 3 Encounters:  05/05/18 132/90  01/02/18 (!) 142/86  09/29/17 122/84    Wt Readings from Last 3 Encounters:  05/05/18 203 lb (92.1 kg)  01/02/18 218 lb 12.8 oz (99.2 kg)  09/29/17 215 lb 6.4 oz (97.7 kg)    General appearance: alert, cooperative and appears stated age Ears: normal TM's and external ear canals both ears Throat: lips, mucosa, and tongue normal; teeth and gums normal Neck: no adenopathy, no carotid bruit, supple, symmetrical, trachea midline and thyroid not enlarged, symmetric, no tenderness/mass/nodules Back: symmetric, no curvature. ROM normal. No CVA tenderness. Lungs: clear to auscultation bilaterally Heart: regular rate and rhythm, S1, S2 normal, no murmur, click, rub or gallop Abdomen: soft, non-tender; bowel sounds normal; no masses,  no organomegaly Pulses: 2+ and  symmetric Skin: Skin color, texture, turgor normal. No rashes or lesions Lymph nodes: Cervical, supraclavicular, and axillary nodes normal.  Lab Results  Component Value Date   HGBA1C 6.1 05/05/2018   HGBA1C 6.6 (H) 01/02/2018   HGBA1C 6.3 09/29/2017    Lab Results  Component Value Date   CREATININE 0.99 05/05/2018   CREATININE 0.98 09/29/2017   CREATININE 0.96 05/12/2017    Lab Results  Component Value Date   WBC 8.6 05/12/2017   HGB 12.9 05/12/2017   HCT 38.7 05/12/2017   PLT 343 05/12/2017   GLUCOSE 93 05/05/2018   CHOL 169 09/29/2017   TRIG 71.0 09/29/2017   HDL 61.00 09/29/2017   LDLCALC 94 09/29/2017   ALT 21 05/05/2018   AST 24 05/05/2018   NA 141 05/05/2018   K 4.4 05/05/2018   CL 104 05/05/2018   CREATININE 0.99 05/05/2018   BUN 11 05/05/2018   CO2 21 05/05/2018   TSH 2.23 01/02/2018   HGBA1C 6.1 05/05/2018   MICROALBUR 1.4 05/08/2012    Ct Head Wo Contrast  Result Date: 05/12/2017 CLINICAL DATA:  Sudden onset of weakness and presyncope. Headache. Hypertension. Cardiac arrhythmia. EXAM: CT HEAD WITHOUT CONTRAST TECHNIQUE: Contiguous axial images were obtained from the base of the skull through the vertex without intravenous contrast. COMPARISON:  MR brain 02/23/2017.    CT head 12/13/2014. FINDINGS: Brain: No evidence for acute infarction, hemorrhage, mass lesion, hydrocephalus, or extra-axial fluid. Normal cerebral volume. Minor white matter disease. Vascular: No signs of proximal large vessel occlusion. Skull: Calvarium intact.  Falx ossification. Sinuses/Orbits: No layering fluid.  Negative appearing orbits. Other: None. IMPRESSION: Unremarkable CT head without contrast. No change from prior CT and MR imaging studies. Electronically Signed   By: Staci Righter M.D.   On: 05/12/2017 13:33    Assessment & Plan:   Problem List Items Addressed This Visit    Chronic fatigue    Secondary to snoring,  Obesity and suspected sleep apnea .  Improving with physical  conditioning and weight loss.  Sleep study was ordered at [facility] but has not been done yet.        Diabetes mellitus without complication ([place]) - Primary   Relevant Orders   Hemoglobin A1c (Completed)   Comprehensive metabolic panel (Completed)   Obesity (BMI 30.0-34.9)    Improved with metformin which is causing appetite suppression via mild nausea.  I have congratulated her in reduction of   BMI and encouraged  Continued weight loss with goal of 10% of body weigh over the next 6 months using a low glycemic index diet and regular exercise a minimum of 5 days per week.        Snoring    Multiple risk factors for OSA including obesity , snoring and headaches  Sleep study ordered        Other Visit Diagnoses    Iron deficiency       Relevant  Orders   Iron, TIBC and Ferritin Panel (Completed)     A total of 25 minutes of face to face time was spent with patient more than half of which was spent in counselling about the above mentioned conditions  and coordination of care  I am having Arnita S. Hagans maintain her acetaminophen, acyclovir ointment, pantoprazole, spironolactone, OXcarbazepine, metFORMIN, cyanocobalamin, SYRINGE 3CC/25GX1", gabapentin, and ALPRAZolam.  No orders of the defined types were placed in this encounter.   There are no discontinued medications.  Follow-up: Return in about 3 months (around 08/05/2018) for follow up diabetes.   Crecencio Mc, MD

## 2018-05-06 LAB — COMPREHENSIVE METABOLIC PANEL
AG Ratio: 1.4 (calc) (ref 1.0–2.5)
ALBUMIN MSPROF: 4.4 g/dL (ref 3.6–5.1)
ALT: 21 U/L (ref 6–29)
AST: 24 U/L (ref 10–35)
Alkaline phosphatase (APISO): 87 U/L (ref 33–130)
BILIRUBIN TOTAL: 0.2 mg/dL (ref 0.2–1.2)
BUN: 11 mg/dL (ref 7–25)
CALCIUM: 10 mg/dL (ref 8.6–10.4)
CO2: 21 mmol/L (ref 20–32)
CREATININE: 0.99 mg/dL (ref 0.50–1.05)
Chloride: 104 mmol/L (ref 98–110)
GLUCOSE: 93 mg/dL (ref 65–99)
Globulin: 3.2 g/dL (calc) (ref 1.9–3.7)
POTASSIUM: 4.4 mmol/L (ref 3.5–5.3)
SODIUM: 141 mmol/L (ref 135–146)
TOTAL PROTEIN: 7.6 g/dL (ref 6.1–8.1)

## 2018-05-06 LAB — IRON,TIBC AND FERRITIN PANEL
%SAT: 11 % — AB (ref 16–45)
Ferritin: 21 ng/mL (ref 16–232)
Iron: 44 ug/dL — ABNORMAL LOW (ref 45–160)
TIBC: 416 mcg/dL (calc) (ref 250–450)

## 2018-05-07 NOTE — Assessment & Plan Note (Signed)
Multiple risk factors for OSA including obesity , snoring and headaches  Sleep study ordered

## 2018-05-07 NOTE — Assessment & Plan Note (Addendum)
Secondary to snoring,  Obesity and suspected sleep apnea .  Improving with physical conditioning and weight loss.  Sleep study was ordered at [facility] but has not been done yet.   

## 2018-05-07 NOTE — Assessment & Plan Note (Signed)
Improved with metformin which is causing appetite suppression via mild nausea.  I have congratulated her in reduction of   BMI and encouraged  Continued weight loss with goal of 10% of body weigh over the next 6 months using a low glycemic index diet and regular exercise a minimum of 5 days per week.

## 2018-05-07 NOTE — Assessment & Plan Note (Signed)
she has lowered her A1c to 6.1 with  Weight loss and metformin.  Encouraged to continue to follow a low glycemic index diet and metformin .  Lab Results  Component Value Date   HGBA1C 6.1 05/05/2018   Lab Results  Component Value Date   MICROALBUR 1.4 05/08/2012    

## 2018-05-25 ENCOUNTER — Other Ambulatory Visit: Payer: Self-pay | Admitting: Neurology

## 2018-05-27 ENCOUNTER — Other Ambulatory Visit: Payer: Self-pay | Admitting: Internal Medicine

## 2018-07-02 ENCOUNTER — Other Ambulatory Visit: Payer: Self-pay | Admitting: Internal Medicine

## 2018-08-09 ENCOUNTER — Ambulatory Visit: Payer: Self-pay | Admitting: Internal Medicine

## 2018-08-28 ENCOUNTER — Encounter: Payer: Self-pay | Admitting: Internal Medicine

## 2018-08-28 ENCOUNTER — Ambulatory Visit: Payer: Managed Care, Other (non HMO) | Admitting: Internal Medicine

## 2018-08-28 VITALS — BP 140/90 | HR 93 | Temp 98.6°F | Wt 195.4 lb

## 2018-08-28 DIAGNOSIS — E119 Type 2 diabetes mellitus without complications: Secondary | ICD-10-CM

## 2018-08-28 DIAGNOSIS — G500 Trigeminal neuralgia: Secondary | ICD-10-CM

## 2018-08-28 DIAGNOSIS — J181 Lobar pneumonia, unspecified organism: Secondary | ICD-10-CM

## 2018-08-28 DIAGNOSIS — E663 Overweight: Secondary | ICD-10-CM | POA: Diagnosis not present

## 2018-08-28 DIAGNOSIS — J189 Pneumonia, unspecified organism: Secondary | ICD-10-CM

## 2018-08-28 DIAGNOSIS — K449 Diaphragmatic hernia without obstruction or gangrene: Secondary | ICD-10-CM | POA: Diagnosis not present

## 2018-08-28 DIAGNOSIS — L309 Dermatitis, unspecified: Secondary | ICD-10-CM

## 2018-08-28 DIAGNOSIS — K210 Gastro-esophageal reflux disease with esophagitis: Secondary | ICD-10-CM

## 2018-08-28 MED ORDER — BETAMETHASONE VALERATE 0.1 % EX CREA: TOPICAL_CREAM | Freq: Two times a day (BID) | CUTANEOUS | 0 refills | Status: DC

## 2018-08-28 NOTE — Progress Notes (Signed)
Subjective:  Patient ID: Mary Bryant, female    DOB: 10-May-1966  Age: 53 y.o. MRN: [id number]  CC: The primary encounter diagnosis was Pneumonia of left lower lobe due to infectious organism [facility]). Diagnoses of Trigeminal neuralgia of left side of face, Overweight (BMI 25.0-29.9), Hiatal hernia with GERD and esophagitis, Diabetes mellitus without complication ([place]), and Acute eczema of hand were also pertinent to this visit.  HPI KAMLA SKILTON presents for follow up on diabetes and obesity,  Last seen in October,  Had lost 15 lbs. STILL HASN'T RECEIVED A CALL FOR SLEEP STUDY (ordered at last visit in [place]).  However she feels great, has no morning fatigue or daytime hypersomnolence since changing her lifestyle  And prefers to defer for ow.  Obesity:  She has lost 11 more lbs through careful diet and regular aerobic exercise.    She has stopped all medications except protonix and metformin,  Has been off of all other meds since jan 1.  Feels great    Cc:  Peeling rash on left hand involving the thumb and index finger, painful. No recent unusual activities or change in detergents, lotions et.c.  Uses gloves when immersing hands in soapy water or doing cleaning jos      2) treated for left lobar pneumonia in early January bu the urgent care , with x ray reported abnormality .  All symptoms have resolved.   Outpatient Medications Prior to Visit  Medication Sig Dispense Refill  . metFORMIN (GLUCOPHAGE) 500 MG tablet Take 1 tablet (500 mg total) by mouth 2 (two) times daily with a meal. 180 tablet 3  . pantoprazole (PROTONIX) 40 MG tablet TAKE 1 TABLET (40 MG TOTAL) BY MOUTH DAILY 90 tablet 3  . acetaminophen (TYLENOL) 500 MG tablet Take 1,000 mg by mouth every 6 (six) hours as needed for mild pain.    Marland Kitchen acyclovir ointment (ZOVIRAX) 5 % Apply 1 application topically 2 (two) times daily. As needed for fever blisters (Patient not taking: Reported on 08/28/2018) 14 g 3  . ALPRAZolam  (XANAX) 0.5 MG tablet Take 0.5 mg by mouth See admin instructions.  2  . cyanocobalamin (,VITAMIN B-12,) 1000 MCG/ML injection INJECT 1000 MCG IM WEEKLY X 3, THEN MONTHLY THEREAFTER (Patient not taking: Reported on 08/28/2018) 10 mL 1  . gabapentin (NEURONTIN) 100 MG capsule TAKE 1 CAPSULE BY MOUTH THREE TIMES A DAY (Patient not taking: Reported on 08/28/2018) 90 capsule 1  . OXcarbazepine (TRILEPTAL) 150 MG tablet Take 1 tablet (150 mg total) by mouth 2 (two) times daily. Please call [phone] to schedule appt for continued refills. (Patient not taking: Reported on 08/28/2018) 180 tablet 0  . spironolactone (ALDACTONE) 50 MG tablet TAKE 1 TABLET (50 MG TOTAL) BY MOUTH DAILY. AS NEEDED FOR FLUID RETENTION (Patient not taking: Reported on 08/28/2018) 30 tablet 5  . Syringe/Needle, Disp, (SYRINGE 3CC/25GX1") 25G X 1" 3 ML MISC Use for b12 injections (Patient not taking: Reported on 08/28/2018) 50 each 0   Facility-Administered Medications Prior to Visit  Medication Dose Route Frequency Provider Last Rate Last Dose  . gadopentetate dimeglumine (MAGNEVIST) injection 20 mL  20 mL Intravenous Once PRN Marcial Pacas, MD        Review of Systems;  Patient denies headache, fevers, malaise, unintentional weight loss, skin rash, eye pain, sinus congestion and sinus pain, sore throat, dysphagia,  hemoptysis , cough, dyspnea, wheezing, chest pain, palpitations, orthopnea, edema, abdominal pain, nausea, melena, diarrhea, constipation, flank pain, dysuria, hematuria, urinary  Frequency, nocturia, numbness, tingling, seizures,  Focal weakness, Loss of consciousness,  Tremor, insomnia, depression, anxiety, and suicidal ideation.      Objective:  BP 140/90   Pulse 93   Temp 98.6 F (37 C) (Oral)   Wt 195 lb 6.4 oz (88.6 kg)   SpO2 97%   BMI 29.71 kg/m   BP Readings from Last 3 Encounters:  08/28/18 140/90  05/05/18 132/90  01/02/18 (!) 142/86    Wt Readings from Last 3 Encounters:  08/28/18 195 lb 6.4 oz  (88.6 kg)  05/05/18 203 lb (92.1 kg)  01/02/18 218 lb 12.8 oz (99.2 kg)    General appearance: alert, cooperative and appears stated age Ears: normal TM's and external ear canals both ears Throat: lips, mucosa, and tongue normal; teeth and gums normal Neck: no adenopathy, no carotid bruit, supple, symmetrical, trachea midline and thyroid not enlarged, symmetric, no tenderness/mass/nodules Back: symmetric, no curvature. ROM normal. No CVA tenderness. Lungs: clear to auscultation bilaterally Heart: regular rate and rhythm, S1, S2 normal, no murmur, click, rub or gallop Abdomen: soft, non-tender; bowel sounds normal; no masses,  no organomegaly Pulses: 2+ and symmetric Skin: Skin color, texture, turgor normal. No rashes or lesions Lymph nodes: Cervical, supraclavicular, and axillary nodes normal.  Lab Results  Component Value Date   HGBA1C 6.1 05/05/2018   HGBA1C 6.6 (H) 01/02/2018   HGBA1C 6.3 09/29/2017    Lab Results  Component Value Date   CREATININE 0.99 05/05/2018   CREATININE 0.98 09/29/2017   CREATININE 0.96 05/12/2017    Lab Results  Component Value Date   WBC 8.6 05/12/2017   HGB 12.9 05/12/2017   HCT 38.7 05/12/2017   PLT 343 05/12/2017   GLUCOSE 93 05/05/2018   CHOL 169 09/29/2017   TRIG 71.0 09/29/2017   HDL 61.00 09/29/2017   LDLCALC 94 09/29/2017   ALT 21 05/05/2018   AST 24 05/05/2018   NA 141 05/05/2018   K 4.4 05/05/2018   CL 104 05/05/2018   CREATININE 0.99 05/05/2018   BUN 11 05/05/2018   CO2 21 05/05/2018   TSH 2.23 01/02/2018   HGBA1C 6.1 05/05/2018   MICROALBUR 1.4 05/08/2012    Ct Head Wo Contrast  Result Date: 05/12/2017 CLINICAL DATA:  Sudden onset of weakness and presyncope. Headache. Hypertension. Cardiac arrhythmia. EXAM: CT HEAD WITHOUT CONTRAST TECHNIQUE: Contiguous axial images were obtained from the base of the skull through the vertex without intravenous contrast. COMPARISON:  MR brain 02/23/2017.    CT head 12/13/2014.  FINDINGS: Brain: No evidence for acute infarction, hemorrhage, mass lesion, hydrocephalus, or extra-axial fluid. Normal cerebral volume. Minor white matter disease. Vascular: No signs of proximal large vessel occlusion. Skull: Calvarium intact.  Falx ossification. Sinuses/Orbits: No layering fluid.  Negative appearing orbits. Other: None. IMPRESSION: Unremarkable CT head without contrast. No change from prior CT and MR imaging studies. Electronically Signed   By: Staci Righter M.D.   On: 05/12/2017 13:33    Assessment & Plan:   Problem List Items Addressed This Visit    Trigeminal neuralgia of left side of face    She has no symptoms currently and has been off of all neuroleptics  since Jan 1       Pneumonia of left lung due to infectious organism - Primary    Symptoms resolved.  Diagnosed and treated with chest x ray/abx by an Urgent Care,  Will need repeat cxr in 6 weeks to ensure clearing of infiltrate  Relevant Orders   DG Chest 2 View   Overweight (BMI 25.0-29.9)    I have congratulated her in reduction of   BMI and encouraged  Continued weight loss with goal of 10% of body weigh over the next 6 months using a low glycemic index diet and regular exercise a minimum of 5 days per week.        Hiatal hernia with GERD and esophagitis    All symptoms have improved with healthier lifestyle.   Continue treatment for GERD       Diabetes mellitus without complication ([place])    she has lowered her A1c to 6.1 with  Weight loss and metformin.  Encouraged to continue to follow a low glycemic index diet and metformin .  Lab Results  Component Value Date   HGBA1C 6.1 05/05/2018   Lab Results  Component Value Date   MICROALBUR 1.4 05/08/2012         Acute eczema of hand    Betamethasone cream prescribed.          I am having Jayliah S. Vittorio start on betamethasone valerate. I am also having her maintain her acetaminophen, acyclovir ointment, spironolactone, metFORMIN, SYRINGE  3CC/25GX1", gabapentin, ALPRAZolam, OXcarbazepine, pantoprazole, and cyanocobalamin.  Meds ordered this encounter  Medications  . betamethasone valerate (VALISONE) 0.1 % cream    Sig: Apply topically 2 (two) times daily.    Dispense:  30 g    Refill:  0    There are no discontinued medications.  Follow-up: Return in about 6 months (around 02/26/2019).   Crecencio Mc, MD

## 2018-08-28 NOTE — Patient Instructions (Addendum)
I am treating you for Eczema of the hand with a high potency steroid cream  Return for labs and a repeat chest x  Ray in 6 weeks   I'll see you in 6 months   Eczema Eczema is a broad term for a group of skin conditions that cause skin to become rough and inflamed. Each type of eczema has different triggers, symptoms, and treatments. Eczema of any type is usually itchy and symptoms range from mild to severe. Eczema and its symptoms are not spread from person to person (are not contagious). It can appear on different parts of the body at different times. Your eczema may not look the same as someone else's eczema. What are the types of eczema? Atopic dermatitis This is a long-term (chronic) skin disease that keeps coming back (recurring). Usual symptoms are dry skin and small, solid pimples that may swell and leak fluid (weep). Contact dermatitis  This happens when something irritates the skin and causes a rash. The irritation can come from substances that you are allergic to (allergens), such as poison ivy, chemicals, or medicines that were applied to your skin. Dyshidrotic eczema This is a form of eczema on the hands and feet. It shows up as very itchy, fluid-filled blisters. It can affect people of any age, but is more common before age 68. Hand eczema  This causes very itchy areas of skin on the palms and sides of the hands and fingers. This type of eczema is common in industrial jobs where you may be exposed to many different types of irritants. Lichen simplex chronicus This type of eczema occurs when a person constantly scratches one area of the body. Repeated scratching of the area leads to thickened skin (lichenification). Lichen simplex chronicus can occur along with other types of eczema. It is more common in adults, but may be seen in children as well. Nummular eczema This is a common type of eczema. It has no known cause. It typically causes a red, circular, crusty lesion (plaque)  that may be itchy. Scratching may become a habit and can cause bleeding. Nummular eczema occurs most often in people of middle-age or older. It most often affects the hands. Seborrheic dermatitis This is a common skin disease that mainly affects the scalp. It may also affect any oily areas of the body, such as the face, sides of nose, eyebrows, ears, eyelids, and chest. It is marked by small scaling and redness of the skin (erythema). This can affect people of all ages. In infants, this condition is known as Chartered certified accountant." Stasis dermatitis This is a common skin disease that usually appears on the legs and feet. It most often occurs in people who have a condition that prevents blood from being pumped through the veins in the legs (chronic venous insufficiency). Stasis dermatitis is a chronic condition that needs long-term management. How is eczema diagnosed? Your health care provider will examine your skin and review your medical history. He or she may also give you skin patch tests. These tests involve taking patches that contain possible allergens and placing them on your back. He or she will then check in a few days to see if an allergic reaction occurred. What are the common treatments? Treatment for eczema is based on the type of eczema you have. Hydrocortisone steroid medicine can relieve itching quickly and help reduce inflammation. This medicine may be prescribed or obtained over-the-counter, depending on the strength of the medicine that is needed. Follow these instructions at  home:  Take over-the-counter and prescription medicines only as told by your health care provider.  Use creams or ointments to moisturize your skin. Do not use lotions.  Learn what triggers or irritates your symptoms. Avoid these things.  Treat symptom flare-ups quickly.  Do not itch your skin. This can make your rash worse.  Keep all follow-up visits as told by your health care provider. This is important. Where  to find more information  The American Academy of Dermatology: http://jones-macias.info/  The National Eczema Association: www.nationaleczema.org Contact a health care provider if:  You have serious itching, even with treatment.  You regularly scratch your skin until it bleeds.  Your rash looks different than usual.  Your skin is painful, swollen, or more red than usual.  You have a fever. Summary  There are eight general types of eczema. Each type has different triggers.  Eczema of any type causes itching that may range from mild to severe.  Treatment varies based on the type of eczema you have. Hydrocortisone steroid medicine can help with itching and inflammation.  Protecting your skin is the best way to prevent eczema. Use moisturizers and lotions. Avoid triggers and irritants, and treat flare-ups quickly. This information is not intended to replace advice given to you by your health care provider. Make sure you discuss any questions you have with your health care provider. Document Released: 11/18/2016 Document Revised: 11/18/2016 Document Reviewed: 11/18/2016 Elsevier Interactive Patient Education  2019 [employer].

## 2018-08-29 DIAGNOSIS — J189 Pneumonia, unspecified organism: Secondary | ICD-10-CM | POA: Insufficient documentation

## 2018-08-29 DIAGNOSIS — L309 Dermatitis, unspecified: Secondary | ICD-10-CM | POA: Insufficient documentation

## 2018-08-29 NOTE — Assessment & Plan Note (Signed)
Betamethasone cream prescribed 

## 2018-08-29 NOTE — Assessment & Plan Note (Signed)
Symptoms resolved.  Diagnosed and treated with chest x ray/abx by an Urgent Care,  Will need repeat cxr in 6 weeks to ensure clearing of infiltrate

## 2018-08-29 NOTE — Assessment & Plan Note (Signed)
I have congratulated her in reduction of   BMI and encouraged  Continued weight loss with goal of 10% of body weigh over the next 6 months using a low glycemic index diet and regular exercise a minimum of 5 days per week.    

## 2018-08-29 NOTE — Assessment & Plan Note (Signed)
she has lowered her A1c to 6.1 with  Weight loss and metformin.  Encouraged to continue to follow a low glycemic index diet and metformin .  Lab Results  Component Value Date   HGBA1C 6.1 05/05/2018   Lab Results  Component Value Date   MICROALBUR 1.4 05/08/2012

## 2018-08-29 NOTE — Assessment & Plan Note (Signed)
All symptoms have improved with healthier lifestyle.   Continue treatment for GERD

## 2018-08-29 NOTE — Assessment & Plan Note (Signed)
She has no symptoms currently and has been off of all neuroleptics  since Jan 1

## 2018-10-04 ENCOUNTER — Other Ambulatory Visit: Payer: Self-pay

## 2018-10-04 ENCOUNTER — Other Ambulatory Visit (INDEPENDENT_AMBULATORY_CARE_PROVIDER_SITE_OTHER): Payer: Managed Care, Other (non HMO)

## 2018-10-04 DIAGNOSIS — E611 Iron deficiency: Secondary | ICD-10-CM

## 2018-10-04 DIAGNOSIS — E119 Type 2 diabetes mellitus without complications: Secondary | ICD-10-CM | POA: Diagnosis not present

## 2018-10-04 LAB — CBC WITH DIFFERENTIAL/PLATELET
BASOS ABS: 0.1 10*3/uL (ref 0.0–0.1)
BASOS PCT: 1.3 % (ref 0.0–3.0)
EOS PCT: 2.5 % (ref 0.0–5.0)
Eosinophils Absolute: 0.2 10*3/uL (ref 0.0–0.7)
HEMATOCRIT: 38.3 % (ref 36.0–46.0)
Hemoglobin: 12.5 g/dL (ref 12.0–15.0)
LYMPHS ABS: 1.8 10*3/uL (ref 0.7–4.0)
LYMPHS PCT: 27.7 % (ref 12.0–46.0)
MCHC: 32.6 g/dL (ref 30.0–36.0)
MCV: 82.2 fl (ref 78.0–100.0)
MONOS PCT: 8.6 % (ref 3.0–12.0)
Monocytes Absolute: 0.6 10*3/uL (ref 0.1–1.0)
NEUTROS ABS: 3.9 10*3/uL (ref 1.4–7.7)
NEUTROS PCT: 59.9 % (ref 43.0–77.0)
PLATELETS: 357 10*3/uL (ref 150.0–400.0)
RBC: 4.66 Mil/uL (ref 3.87–5.11)
RDW: 16.2 % — ABNORMAL HIGH (ref 11.5–15.5)
WBC: 6.6 10*3/uL (ref 4.0–10.5)

## 2018-10-04 LAB — MICROALBUMIN / CREATININE URINE RATIO
Creatinine,U: 256.7 mg/dL
MICROALB UR: 1.4 mg/dL (ref 0.0–1.9)
Microalb Creat Ratio: 0.5 mg/g (ref 0.0–30.0)

## 2018-10-04 LAB — HEMOGLOBIN A1C: Hgb A1c MFr Bld: 6.2 % (ref 4.6–6.5)

## 2018-10-04 LAB — COMPREHENSIVE METABOLIC PANEL
ALT: 18 U/L (ref 0–35)
AST: 20 U/L (ref 0–37)
Albumin: 4.3 g/dL (ref 3.5–5.2)
Alkaline Phosphatase: 75 U/L (ref 39–117)
BUN: 10 mg/dL (ref 6–23)
CO2: 27 meq/L (ref 19–32)
Calcium: 9.7 mg/dL (ref 8.4–10.5)
Chloride: 103 mEq/L (ref 96–112)
Creatinine, Ser: 0.94 mg/dL (ref 0.40–1.20)
GFR: 62.72 mL/min (ref >60.00)
GLUCOSE: 109 mg/dL — AB (ref 70–99)
POTASSIUM: 3.8 meq/L (ref 3.5–5.1)
SODIUM: 138 meq/L (ref 135–145)
Total Bilirubin: 0.5 mg/dL (ref 0.2–1.2)
Total Protein: 7.6 g/dL (ref 6.0–8.3)

## 2018-10-04 LAB — LIPID PANEL
CHOL/HDL RATIO: 3
Cholesterol: 178 mg/dL (ref 0–200)
HDL: 60 mg/dL (ref >39.00)
LDL Cholesterol: 104 mg/dL — ABNORMAL HIGH (ref 0–99)
NONHDL: 118.29
Triglycerides: 73 mg/dL (ref 0.0–149.0)
VLDL: 14.6 mg/dL (ref 0.0–40.0)

## 2018-10-05 LAB — IRON,TIBC AND FERRITIN PANEL
%SAT: 15 % (calc) — ABNORMAL LOW (ref 16–45)
Ferritin: 14 ng/mL — ABNORMAL LOW (ref 16–232)
Iron: 58 ug/dL (ref 45–160)
TIBC: 397 mcg/dL (calc) (ref 250–450)

## 2018-12-01 NOTE — Telephone Encounter (Signed)
error 

## 2018-12-13 ENCOUNTER — Ambulatory Visit: Payer: Self-pay | Admitting: *Deleted

## 2018-12-13 NOTE — Telephone Encounter (Signed)
LMTCB. PEC may speak with pt.  

## 2018-12-13 NOTE — Telephone Encounter (Signed)
She does NOT need to stay home

## 2018-12-13 NOTE — Telephone Encounter (Signed)
Pt called stating that she thinks her grand daughter has chicken pox; the pt would like to know if she should go to work; the pt says that she had chicken pox as a child; recommendations made nurse triage protocol; pt also advised to get contact her grand daughter's pediatrician; she verbalizes understanding, and will stay home from work today; will route to office for notification; she normally sees Dr Derrel Nip, [facility].  Reason for Disposition . Had chickenpox before  Answer Assessment - Initial Assessment Questions 1. TYPE of CONTACT: "How much contact was there?" (e.g., live in same house, work in same office)     Same house 2. DATE of CONTACT: "When did you have contact with the chickenpox patient?" (e.g., days)     12/12/2018 3. SYMPTOMS: "Do you have any symptoms?" "Any rash?" "Any fever?"     no 4. VARICELLA VACCINE: "Have you ever received the chickenpox vaccine?"     no 5. PRIOR CHICKENPOX HISTORY: "Have you ever had chickenpox before?"   yes 6. PREGNANCY: "Is there any chance you are pregnant?" "When was your last menstrual period?"     No, menopause 7. RISK FACTORS: "Do you have a weakened immune system?" (e.g., HIV positive, cancer chemotherapy, chronic steroid treatment, splenectomy)     no  Protocols used: CHICKENPOX EXPOSURE-A-AH

## 2019-01-06 ENCOUNTER — Other Ambulatory Visit: Payer: Self-pay | Admitting: Internal Medicine

## 2019-02-06 ENCOUNTER — Other Ambulatory Visit: Payer: Self-pay

## 2019-02-06 DIAGNOSIS — Z20822 Contact with and (suspected) exposure to covid-19: Secondary | ICD-10-CM

## 2019-02-07 ENCOUNTER — Other Ambulatory Visit: Payer: Self-pay | Admitting: Internal Medicine

## 2019-02-07 DIAGNOSIS — Z20828 Contact with and (suspected) exposure to other viral communicable diseases: Secondary | ICD-10-CM

## 2019-02-07 DIAGNOSIS — Z20822 Contact with and (suspected) exposure to covid-19: Secondary | ICD-10-CM

## 2019-02-07 NOTE — Progress Notes (Signed)
ro

## 2019-02-08 LAB — NOVEL CORONAVIRUS, NAA: SARS-CoV-2, NAA: NOT DETECTED

## 2019-02-20 ENCOUNTER — Other Ambulatory Visit: Payer: Self-pay | Admitting: Internal Medicine

## 2019-02-20 MED ORDER — ACYCLOVIR 400 MG PO TABS: 400.0000 mg | ORAL_TABLET | ORAL | 0 refills | Status: AC

## 2019-02-26 ENCOUNTER — Encounter: Payer: Self-pay | Admitting: Internal Medicine

## 2019-02-26 ENCOUNTER — Ambulatory Visit (INDEPENDENT_AMBULATORY_CARE_PROVIDER_SITE_OTHER): Payer: Managed Care, Other (non HMO) | Admitting: Internal Medicine

## 2019-02-26 ENCOUNTER — Other Ambulatory Visit: Payer: Self-pay

## 2019-02-26 VITALS — BP 122/88 | HR 87 | Temp 97.8°F | Resp 14 | Ht 68.0 in | Wt 191.8 lb

## 2019-02-26 DIAGNOSIS — R5382 Chronic fatigue, unspecified: Secondary | ICD-10-CM

## 2019-02-26 DIAGNOSIS — E663 Overweight: Secondary | ICD-10-CM

## 2019-02-26 DIAGNOSIS — R7303 Prediabetes: Secondary | ICD-10-CM

## 2019-02-26 DIAGNOSIS — Z1239 Encounter for other screening for malignant neoplasm of breast: Secondary | ICD-10-CM | POA: Diagnosis not present

## 2019-02-26 DIAGNOSIS — R634 Abnormal weight loss: Secondary | ICD-10-CM | POA: Diagnosis not present

## 2019-02-26 DIAGNOSIS — Z0000 Encounter for general adult medical examination without abnormal findings: Secondary | ICD-10-CM | POA: Diagnosis not present

## 2019-02-26 DIAGNOSIS — E119 Type 2 diabetes mellitus without complications: Secondary | ICD-10-CM

## 2019-02-26 DIAGNOSIS — E611 Iron deficiency: Secondary | ICD-10-CM

## 2019-02-26 DIAGNOSIS — F411 Generalized anxiety disorder: Secondary | ICD-10-CM

## 2019-02-26 NOTE — Patient Instructions (Addendum)
You look FABULOUS !     I am VERY IMPRESSED!  Do not let your daughter's issues DERAIL YOUR EFFORTS.  KEEP YOUR  PRIORITIES (AND YOUR  BOUNDARIES) AND STICK TO THEM    IRON DEFICIENCY IN A NON MENSTRUATING FEMALE NEEDS FURTHER WORKUP TO FIGURE OUT THE CAUSE.    IF YOUR IRON STORES ARE STILL LOW TODAY,  WE'LL GET ADDITIONAL TESTS DONE    Iron-Rich Diet  Iron is a mineral that helps your body to produce hemoglobin. Hemoglobin is a protein in red blood cells that carries oxygen to your body's tissues. Eating too little iron may cause you to feel weak and tired, and it can increase your risk of infection. Iron is naturally found in many foods, and many foods have iron added to them (iron-fortified foods). You may need to follow an iron-rich diet if you do not have enough iron in your body due to certain medical conditions. The amount of iron that you need each day depends on your age, your sex, and any medical conditions you have. Follow instructions from your health care provider or a diet and nutrition specialist (dietitian) about how much iron you should eat each day. What are tips for following this plan? Reading food labels  Check food labels to see how many milligrams (mg) of iron are in each serving. Cooking  Cook foods in pots and pans that are made from iron.  Take these steps to make it easier for your body to absorb iron from certain foods: ? Soak beans overnight before cooking. ? Soak whole grains overnight and drain them before using. ? Ferment flours before baking, such as by using yeast in bread dough. Meal planning  When you eat foods that contain iron, you should eat them with foods that are high in vitamin C. These include oranges, peppers, tomatoes, potatoes, and mango. Vitamin C helps your body to absorb iron. General information  Take iron supplements only as told by your health care provider. An overdose of iron can be life-threatening. If you were prescribed iron  supplements, take them with orange juice or a vitamin C supplement.  When you eat iron-fortified foods or take an iron supplement, you should also eat foods that naturally contain iron, such as meat, poultry, and fish. Eating naturally iron-rich foods helps your body to absorb the iron that is added to other foods or contained in a supplement.  Certain foods and drinks prevent your body from absorbing iron properly. Avoid eating these foods in the same meal as iron-rich foods or with iron supplements. These foods include: ? Coffee, black tea, and red wine. ? Milk, dairy products, and foods that are high in calcium. ? Beans and soybeans. ? Whole grains. What foods should I eat? Fruits Prunes. Raisins. Eat fruits high in vitamin C, such as oranges, grapefruits, and strawberries, alongside iron-rich foods. Vegetables Spinach (cooked). Green peas. Broccoli. Fermented vegetables. Eat vegetables high in vitamin C, such as leafy greens, potatoes, bell peppers, and tomatoes, alongside iron-rich foods. Grains Iron-fortified breakfast cereal. Iron-fortified whole-wheat bread. Enriched rice. Sprouted grains. Meats and other proteins Beef liver. Oysters. Beef. Shrimp. [place]. Chicken. [place]. Sardines. Chickpeas. Nuts. Tofu. Pumpkin seeds. Beverages Tomato juice. Fresh orange juice. Prune juice. Hibiscus tea. Fortified instant breakfast shakes. Sweets and desserts Blackstrap molasses. Seasonings and condiments Tahini. Fermented soy sauce. Other foods Wheat germ. The items listed above may not be a complete list of recommended foods and beverages. Contact a dietitian for more information. What  foods should I avoid? Grains Whole grains. Bran cereal. Bran flour. Oats. Meats and other proteins Soybeans. Products made from soy protein. Black beans. Lentils. Mung beans. Split peas. Dairy Milk. Cream. Cheese. Yogurt. Cottage cheese. Beverages Coffee. Black tea. Red wine. Sweets and desserts Cocoa.  Chocolate. Ice cream. Other foods Basil. Oregano. Large amounts of parsley. The items listed above may not be a complete list of foods and beverages to avoid. Contact a dietitian for more information. Summary  Iron is a mineral that helps your body to produce hemoglobin. Hemoglobin is a protein in red blood cells that carries oxygen to your body's tissues.  Iron is naturally found in many foods, and many foods have iron added to them (iron-fortified foods).  When you eat foods that contain iron, you should eat them with foods that are high in vitamin C. Vitamin C helps your body to absorb iron.  Certain foods and drinks prevent your body from absorbing iron properly, such as whole grains and dairy products. You should avoid eating these foods in the same meal as iron-rich foods or with iron supplements. This information is not intended to replace advice given to you by your health care provider. Make sure you discuss any questions you have with your health care provider. Document Released: 02/16/2005 Document Revised: 06/17/2017 Document Reviewed: 05/31/2017 Elsevier Patient Education  2020 [employer].

## 2019-02-26 NOTE — Progress Notes (Signed)
Subjective:  Patient ID: Mary Bryant, female    DOB: 13-Mar-1966  Age: 53 y.o. MRN: [id number]  CC: The primary encounter diagnosis was Iron deficiency. Diagnoses of Prediabetes, Breast cancer screening, Weight loss, Overweight (BMI 25.0-29.9), Encounter for preventive health examination, Generalized anxiety disorder, Diabetes mellitus without complication ([place]), and Chronic fatigue were also pertinent to this visit.  HPI TIMBER MARSHMAN presents for annual comprehensive exam and  6 MONTH FOLLOW UP on Type 2 DM  And obesity   She has lost 27 lbs since June 2019. She feels great .  Using b12 injections and metformin to curb his appetite  Having dental surgery in September left upper molar removed,  Implant needed  Iron deficiency:  Noted in March 2020 without anemia. Not tolerating otc supplements due to nausea .    Social history:  Daughter has lost her home with very llittle warning and has moved back in with patient along with her husband and children.    Outpatient Medications Prior to Visit  Medication Sig Dispense Refill  . acetaminophen (TYLENOL) 500 MG tablet Take 1,000 mg by mouth every 6 (six) hours as needed for mild pain.    Marland Kitchen acyclovir (ZOVIRAX) 400 MG tablet Take 1 tablet (400 mg total) by mouth every 4 (four) hours while awake for 10 days. 25 tablet 0  . betamethasone valerate (VALISONE) 0.1 % cream Apply topically 2 (two) times daily. 30 g 0  . cyanocobalamin (,VITAMIN B-12,) 1000 MCG/ML injection INJECT 1000 MCG IM WEEKLY X 3, THEN MONTHLY THEREAFTER 10 mL 1  . metFORMIN (GLUCOPHAGE) 500 MG tablet TAKE 1 TABLET (500 MG TOTAL) BY MOUTH 2 (TWO) TIMES DAILY WITH A MEAL. 180 tablet 3  . pantoprazole (PROTONIX) 40 MG tablet TAKE 1 TABLET (40 MG TOTAL) BY MOUTH DAILY 90 tablet 3  . Syringe/Needle, Disp, (SYRINGE 3CC/25GX1") 25G X 1" 3 ML MISC Use for b12 injections 50 each 0  . ALPRAZolam (XANAX) 0.5 MG tablet Take 0.5 mg by mouth See admin instructions.  2  . gabapentin  (NEURONTIN) 100 MG capsule TAKE 1 CAPSULE BY MOUTH THREE TIMES A DAY (Patient not taking: Reported on 08/28/2018) 90 capsule 1  . OXcarbazepine (TRILEPTAL) 150 MG tablet Take 1 tablet (150 mg total) by mouth 2 (two) times daily. Please call [phone] to schedule appt for continued refills. (Patient not taking: Reported on 08/28/2018) 180 tablet 0  . spironolactone (ALDACTONE) 50 MG tablet TAKE 1 TABLET (50 MG TOTAL) BY MOUTH DAILY. AS NEEDED FOR FLUID RETENTION (Patient not taking: Reported on 08/28/2018) 30 tablet 5   Facility-Administered Medications Prior to Visit  Medication Dose Route Frequency Provider Last Rate Last Dose  . gadopentetate dimeglumine (MAGNEVIST) injection 20 mL  20 mL Intravenous Once PRN Marcial Pacas, MD        Review of Systems;  Patient denies headache, fevers, malaise, unintentional weight loss, skin rash, eye pain, sinus congestion and sinus pain, sore throat, dysphagia,  hemoptysis , cough, dyspnea, wheezing, chest pain, palpitations, orthopnea, edema, abdominal pain, nausea, melena, diarrhea, constipation, flank pain, dysuria, hematuria, urinary  Frequency, nocturia, numbness, tingling, seizures,  Focal weakness, Loss of consciousness,  Tremor, insomnia, depression, anxiety, and suicidal ideation.      Objective:  BP 122/88 (BP Location: Left Arm, Patient Position: Sitting, Cuff Size: Large)   Pulse 87   Temp 97.8 F (36.6 C) (Oral)   Resp 14   Ht 5\' 8"  (1.727 m)   Wt 191 lb 12.8 oz (87 kg)  SpO2 95%   BMI 29.16 kg/m   BP Readings from Last 3 Encounters:  02/26/19 122/88  08/28/18 140/90  05/05/18 132/90    Wt Readings from Last 3 Encounters:  02/26/19 191 lb 12.8 oz (87 kg)  08/28/18 195 lb 6.4 oz (88.6 kg)  05/05/18 203 lb (92.1 kg)    General appearance: alert, cooperative and appears stated age Ears: normal TM's and external ear canals both ears Throat: lips, mucosa, and tongue normal; teeth and gums normal Neck: no adenopathy, no carotid bruit,  supple, symmetrical, trachea midline and thyroid not enlarged, symmetric, no tenderness/mass/nodules Back: symmetric, no curvature. ROM normal. No CVA tenderness. Lungs: clear to auscultation bilaterally Heart: regular rate and rhythm, S1, S2 normal, no murmur, click, rub or gallop Abdomen: soft, non-tender; bowel sounds normal; no masses,  no organomegaly Pulses: 2+ and symmetric Skin: Skin color, texture, turgor normal. No rashes or lesions Lymph nodes: Cervical, supraclavicular, and axillary nodes normal.  Lab Results  Component Value Date   HGBA1C 6.2 02/26/2019   HGBA1C 6.2 10/04/2018   HGBA1C 6.1 05/05/2018    Lab Results  Component Value Date   CREATININE 0.95 02/26/2019   CREATININE 0.94 10/04/2018   CREATININE 0.99 05/05/2018    Lab Results  Component Value Date   WBC 9.5 02/26/2019   HGB 12.7 02/26/2019   HCT 39.4 02/26/2019   PLT 358.0 02/26/2019   GLUCOSE 93 02/26/2019   CHOL 178 10/04/2018   TRIG 73.0 10/04/2018   HDL 60.00 10/04/2018   LDLCALC 104 (H) 10/04/2018   ALT 14 02/26/2019   AST 16 02/26/2019   NA 137 02/26/2019   K 3.9 02/26/2019   CL 103 02/26/2019   CREATININE 0.95 02/26/2019   BUN 12 02/26/2019   CO2 26 02/26/2019   TSH 1.72 02/26/2019   HGBA1C 6.2 02/26/2019   MICROALBUR 1.4 10/04/2018    Ct Head Wo Contrast  Result Date: 05/12/2017 CLINICAL DATA:  Sudden onset of weakness and presyncope. Headache. Hypertension. Cardiac arrhythmia. EXAM: CT HEAD WITHOUT CONTRAST TECHNIQUE: Contiguous axial images were obtained from the base of the skull through the vertex without intravenous contrast. COMPARISON:  MR brain 02/23/2017.    CT head 12/13/2014. FINDINGS: Brain: No evidence for acute infarction, hemorrhage, mass lesion, hydrocephalus, or extra-axial fluid. Normal cerebral volume. Minor white matter disease. Vascular: No signs of proximal large vessel occlusion. Skull: Calvarium intact.  Falx ossification. Sinuses/Orbits: No layering fluid.   Negative appearing orbits. Other: None. IMPRESSION: Unremarkable CT head without contrast. No change from prior CT and MR imaging studies. Electronically Signed   By: Staci Righter M.D.   On: 05/12/2017 13:33    Assessment & Plan:   Problem List Items Addressed This Visit      Unprioritized   Overweight (BMI 25.0-29.9)    I have congratulated her in reduction of   BMI and encouraged  Continued weight loss with goal of 10% of body weigh over the next 6 months using a low glycemic index diet and regular exercise a minimum of 5 days per week.        Generalized anxiety disorder    Her anxiety has not recurred  despite stopping lexapro and alprazolam       Encounter for preventive health examination    age appropriate education and counseling updated, referrals for preventative services and immunizations addressed, dietary and smoking counseling addressed, most recent labs reviewed.  I have personally reviewed and have noted:  1) the patient's medical and social  history 2) The pt's use of alcohol, tobacco, and illicit drugs 3) The patient's current medications and supplements 4) Functional ability including ADL's, fall risk, home safety risk, hearing and visual impairment 5) Diet and physical activities 6) Evidence for depression or mood disorder 7) The patient's height, weight, and BMI have been recorded in the chart  I have made referrals, and provided counseling and education based on review of the above      Diabetes mellitus without complication ([place])    she has lowered her A1c to 6.2 with  Weight loss and metformin.  Encouraged to continue to follow a low glycemic index diet and metformin .  Lab Results  Component Value Date   HGBA1C 6.2 02/26/2019   Lab Results  Component Value Date   MICROALBUR 1.4 10/04/2018         Chronic fatigue    Markedly improved with low GI diet, weight loss and regular exercise.      Iron deficiency - Primary    She remains iron  deficient but not anemic.  She has not tolerated iron supplementation.  Will recommend iron rich diet and repeat CBC in 3 months       Relevant Orders   Iron, TIBC and Ferritin Panel (Completed)   CBC with Differential/Platelet (Completed)   Erythropoietin    Other Visit Diagnoses    Prediabetes       Relevant Orders   Comprehensive metabolic panel (Completed)   Hemoglobin A1c (Completed)   Breast cancer screening       Relevant Orders   MM 3D SCREEN BREAST BILATERAL   Weight loss       Relevant Orders   TSH (Completed)      I have discontinued Lorriane S. Danford's spironolactone, gabapentin, and OXcarbazepine. I am also having her maintain her acetaminophen, SYRINGE 3CC/25GX1", ALPRAZolam, pantoprazole, cyanocobalamin, betamethasone valerate, metFORMIN, and acyclovir.  No orders of the defined types were placed in this encounter.   Medications Discontinued During This Encounter  Medication Reason  . gabapentin (NEURONTIN) 100 MG capsule Error  . OXcarbazepine (TRILEPTAL) 150 MG tablet Error  . spironolactone (ALDACTONE) 50 MG tablet Error    Follow-up: No follow-ups on file.   Crecencio Mc, MD

## 2019-02-27 DIAGNOSIS — E611 Iron deficiency: Secondary | ICD-10-CM | POA: Insufficient documentation

## 2019-02-27 LAB — CBC WITH DIFFERENTIAL/PLATELET
Basophils Absolute: 0.1 10*3/uL (ref 0.0–0.1)
Basophils Relative: 1.3 % (ref 0.0–3.0)
Eosinophils Absolute: 0.1 10*3/uL (ref 0.0–0.7)
Eosinophils Relative: 1.6 % (ref 0.0–5.0)
HCT: 39.4 % (ref 36.0–46.0)
Hemoglobin: 12.7 g/dL (ref 12.0–15.0)
Lymphocytes Relative: 27.3 % (ref 12.0–46.0)
Lymphs Abs: 2.6 10*3/uL (ref 0.7–4.0)
MCHC: 32.2 g/dL (ref 30.0–36.0)
MCV: 82.8 fl (ref 78.0–100.0)
Monocytes Absolute: 0.7 10*3/uL (ref 0.1–1.0)
Monocytes Relative: 7.5 % (ref 3.0–12.0)
Neutro Abs: 5.9 10*3/uL (ref 1.4–7.7)
Neutrophils Relative %: 62.3 % (ref 43.0–77.0)
Platelets: 358 10*3/uL (ref 150.0–400.0)
RBC: 4.76 Mil/uL (ref 3.87–5.11)
RDW: 15 % (ref 11.5–15.5)
WBC: 9.5 10*3/uL (ref 4.0–10.5)

## 2019-02-27 LAB — COMPREHENSIVE METABOLIC PANEL
ALT: 14 U/L (ref 0–35)
AST: 16 U/L (ref 0–37)
Albumin: 4.4 g/dL (ref 3.5–5.2)
Alkaline Phosphatase: 85 U/L (ref 39–117)
BUN: 12 mg/dL (ref 6–23)
CO2: 26 mEq/L (ref 19–32)
Calcium: 9.4 mg/dL (ref 8.4–10.5)
Chloride: 103 mEq/L (ref 96–112)
Creatinine, Ser: 0.95 mg/dL (ref 0.40–1.20)
GFR: 61.87 mL/min (ref >60.00)
Glucose, Bld: 93 mg/dL (ref 70–99)
Potassium: 3.9 mEq/L (ref 3.5–5.1)
Sodium: 137 mEq/L (ref 135–145)
Total Bilirubin: 0.3 mg/dL (ref 0.2–1.2)
Total Protein: 7.7 g/dL (ref 6.0–8.3)

## 2019-02-27 LAB — TSH: TSH: 1.72 u[IU]/mL (ref 0.35–4.50)

## 2019-02-27 LAB — HEMOGLOBIN A1C: Hgb A1c MFr Bld: 6.2 % (ref 4.6–6.5)

## 2019-02-27 NOTE — Assessment & Plan Note (Signed)

## 2019-02-27 NOTE — Assessment & Plan Note (Signed)
I have congratulated her in reduction of   BMI and encouraged  Continued weight loss with goal of 10% of body weigh over the next 6 months using a low glycemic index diet and regular exercise a minimum of 5 days per week.    

## 2019-02-27 NOTE — Assessment & Plan Note (Signed)
Markedly improved with low GI diet, weight loss and regular exercise.

## 2019-02-27 NOTE — Assessment & Plan Note (Signed)
Her anxiety has not recurred  despite stopping lexapro and alprazolam

## 2019-02-27 NOTE — Assessment & Plan Note (Signed)
She remains iron deficient but not anemic.  She has not tolerated iron supplementation.  Will recommend iron rich diet and repeat CBC in 3 months

## 2019-02-27 NOTE — Assessment & Plan Note (Signed)
she has lowered her A1c to 6.2 with  Weight loss and metformin.  Encouraged to continue to follow a low glycemic index diet and metformin .  Lab Results  Component Value Date   HGBA1C 6.2 02/26/2019   Lab Results  Component Value Date   MICROALBUR 1.4 10/04/2018

## 2019-02-28 LAB — IRON,TIBC AND FERRITIN PANEL
%SAT: 9 % (calc) — ABNORMAL LOW (ref 16–45)
Ferritin: 12 ng/mL — ABNORMAL LOW (ref 16–232)
Iron: 37 ug/dL — ABNORMAL LOW (ref 45–160)
TIBC: 409 mcg/dL (calc) (ref 250–450)

## 2019-02-28 LAB — ERYTHROPOIETIN: Erythropoietin: 15.5 m[IU]/mL (ref 2.6–18.5)

## 2019-03-02 ENCOUNTER — Other Ambulatory Visit: Payer: Self-pay | Admitting: Internal Medicine

## 2019-03-02 ENCOUNTER — Encounter: Payer: Self-pay | Admitting: Internal Medicine

## 2019-03-02 DIAGNOSIS — E611 Iron deficiency: Secondary | ICD-10-CM

## 2019-03-02 MED ORDER — BETAMETHASONE VALERATE 0.1 % EX CREA: TOPICAL_CREAM | Freq: Two times a day (BID) | CUTANEOUS | 0 refills | Status: DC

## 2019-03-12 ENCOUNTER — Ambulatory Visit (INDEPENDENT_AMBULATORY_CARE_PROVIDER_SITE_OTHER): Payer: Managed Care, Other (non HMO) | Admitting: Gastroenterology

## 2019-03-12 ENCOUNTER — Other Ambulatory Visit: Payer: Self-pay

## 2019-03-12 ENCOUNTER — Encounter: Payer: Self-pay | Admitting: *Deleted

## 2019-03-12 ENCOUNTER — Encounter: Payer: Self-pay | Admitting: Gastroenterology

## 2019-03-12 ENCOUNTER — Encounter: Payer: Self-pay | Admitting: Internal Medicine

## 2019-03-12 VITALS — BP 138/92 | HR 101 | Temp 97.7°F | Ht 67.0 in | Wt 193.8 lb

## 2019-03-12 DIAGNOSIS — D500 Iron deficiency anemia secondary to blood loss (chronic): Secondary | ICD-10-CM

## 2019-03-12 NOTE — Progress Notes (Signed)
Gastroenterology Consultation  Referring Provider:     Crecencio Mc, MD Primary Care Physician:  Crecencio Mc, MD Primary Gastroenterologist:  Dr. Allen Norris     Reason for Consultation:     Iron deficiency        HPI:   Mary Bryant is a 53 y.o. y/o female referred for consultation & management of iron deficiency by Dr. Derrel Nip, Aris Everts, MD.  This patient comes today with a history of iron deficiency.  The iron studies that the patient showed:   Iron 45 - 160 mcg/dL 37Low    TIBC 250 - 450 mcg/dL (calc) 409   %SAT 16 - 45 % (calc) 9Low    Ferritin 16 - 232 ng/mL 12Low     The patient hemoglobin at the time of this labs was 12.7.  The patient did have a upper endoscopy by Dr. Vicente Males back in 2017 for some epigastric pain and a 5 cm hiatal hernia was found but no other cause for the patient's abdominal pain or symptoms were seen.  The patient's main symptom was fatigue and she was put on an iron rich diet and had reported that the fatigue had improved.  Despite being on a iron rich diet the patient's iron studies are still low.  The patient that she had a partial hysterectomy and does not have any uterus at the present time.  The patient also states that she has not seen any bleeding from anywhere else such as her urine.  The patient denies any unexplained weight loss fevers chills nausea or vomiting.  Past Medical History:  Diagnosis Date  . Esophageal stricture    dilated 2009,  [place]  . GERD (gastroesophageal reflux disease)   . History of hiatal hernia   . Hypertension    no prior treatment  . Insomnia    chronic, no prior sleep study  . Left bundle branch block   . PONV (postoperative nausea and vomiting)   . rhinitis allergic   . Trigeminal neuralgia   . Wears contact lenses   . Wears hearing aid    bilateral    Past Surgical History:  Procedure Laterality Date  . ABDOMINAL HYSTERECTOMY  2000   no history of ca   . CHOLECYSTECTOMY  2008  .  ESOPHAGOGASTRODUODENOSCOPY (EGD) WITH PROPOFOL N/A 05/12/2016   Procedure: ESOPHAGOGASTRODUODENOSCOPY (EGD) WITH PROPOFOL;  Surgeon: Jonathon Bellows, MD;  Location: [facility];  Service: Endoscopy;  Laterality: N/A;    Prior to Admission medications   Medication Sig Start Date End Date Taking? Authorizing Provider  acetaminophen (TYLENOL) 500 MG tablet Take 1,000 mg by mouth every 6 (six) hours as needed for mild pain.    [provider]  ALPRAZolam Duanne Moron) 0.5 MG tablet Take 0.5 mg by mouth See admin instructions. 04/10/18   [provider]  betamethasone valerate (VALISONE) 0.1 % cream Apply topically 2 (two) times daily. 03/02/19   Crecencio Mc, MD  cyanocobalamin (,VITAMIN B-12,) 1000 MCG/ML injection INJECT 1000 MCG IM WEEKLY X 3, THEN MONTHLY THEREAFTER 07/03/18   Crecencio Mc, MD  metFORMIN (GLUCOPHAGE) 500 MG tablet TAKE 1 TABLET (500 MG TOTAL) BY MOUTH 2 (TWO) TIMES DAILY WITH A MEAL. 01/08/19   Crecencio Mc, MD  pantoprazole (PROTONIX) 40 MG tablet TAKE 1 TABLET (40 MG TOTAL) BY MOUTH DAILY 05/29/18   Crecencio Mc, MD  Syringe/Needle, Disp, (SYRINGE 3CC/25GX1") 25G X 1" 3 ML MISC Use for b12 injections 01/04/18  Crecencio Mc, MD    Family History  Problem Relation Age of Onset  . Cancer Father        Bladder,  in Hospice  . Diabetes type II Father   . COPD Father   . Coronary artery disease Father   . Heart disease Father 89  . Diabetes Mother   . Hyperlipidemia Mother   . Hypertension Mother   . Early death Paternal Grandmother   . Heart disease Paternal Grandmother        CAD  . Prostate cancer Paternal Grandfather      Social History   Tobacco Use  . Smoking status: Never Smoker  . Smokeless tobacco: Never Used  Substance Use Topics  . Alcohol use: No  . Drug use: No    Allergies as of 03/12/2019 - Review Complete 02/26/2019  Allergen Reaction Noted  . Promethazine Anaphylaxis 04/04/2015  . Benadryl [diphenhydramine hcl]  Hives 05/17/2011  . Phenothiazines Other (See Comments) 10/25/2013    Review of Systems:    All systems reviewed and negative except where noted in HPI.   Physical Exam:  There were no vitals taken for this visit. No LMP recorded. Patient has had a hysterectomy. General:   Alert,  Well-developed, well-nourished, pleasant and cooperative in NAD Head:  Normocephalic and atraumatic. Eyes:  Sclera clear, no icterus.   Conjunctiva pink. Ears:  Normal auditory acuity. Nose:  No deformity, discharge, or lesions. Mouth:  No deformity or lesions,oropharynx pink & moist. Neck:  Supple; no masses or thyromegaly. Lungs:  Respirations even and unlabored.  Clear throughout to auscultation.   No wheezes, crackles, or rhonchi. No acute distress. Heart:  Regular rate and rhythm; no murmurs, clicks, rubs, or gallops. Abdomen:  Normal bowel sounds.  No bruits.  Soft, non-tender and non-distended without masses, hepatosplenomegaly or hernias noted.  No guarding or rebound tenderness.  Negative Carnett sign.   Rectal:  Deferred.  Msk:  Symmetrical without gross deformities.  Good, equal movement & strength bilaterally. Pulses:  Normal pulses noted. Extremities:  No clubbing or edema.  No cyanosis. Neurologic:  Alert and oriented x3;  grossly normal neurologically. Skin:  Intact without significant lesions or rashes.  No jaundice. Lymph Nodes:  No significant cervical adenopathy. Psych:  Alert and cooperative. Normal mood and affect.  Imaging Studies: No results found.  Assessment and Plan:   Mary Bryant is a 53 y.o. y/o female who comes in today with a history of iron deficiency and no sign of any GI bleeding.  The patient will be set up for an EGD and colonoscopy to look for source of her iron deficiency.  If the patient's EGD and colonoscopy are negative she may need a capsule endoscopy.  She has also been told that if nothing is found she may need to go on oral iron supplementation. I have  discussed risks & benefits which include, but are not limited to, bleeding, infection, perforation & drug reaction.  The patient agrees with this plan & written consent will be obtained.     Lucilla Lame, MD. Marval Regal    Note: This dictation was prepared with Dragon dictation along with smaller phrase technology. Any transcriptional errors that result from this process are unintentional.

## 2019-03-13 ENCOUNTER — Other Ambulatory Visit: Payer: Self-pay

## 2019-03-13 DIAGNOSIS — D500 Iron deficiency anemia secondary to blood loss (chronic): Secondary | ICD-10-CM

## 2019-03-16 IMAGING — DX DG FOOT COMPLETE 3+V*R*
3 series · 3 of 3 positions shown · non-contrast
Comparison: None.

CLINICAL DATA: Pain following inversion injury several days prior

EXAM:
RIGHT FOOT COMPLETE - 3+ VIEW

[foot ap]
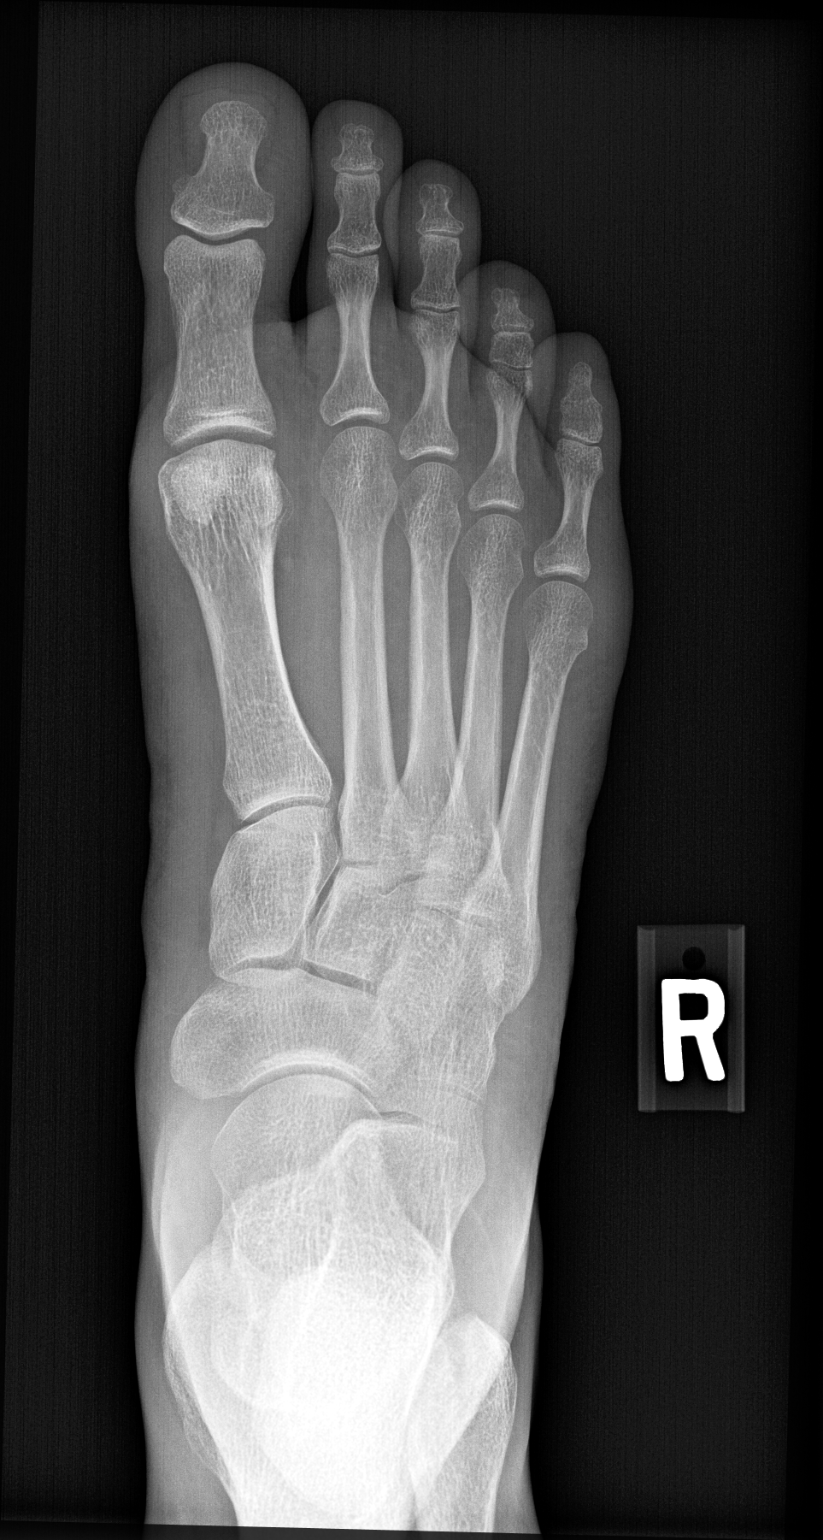

[foot obl (oblique)]
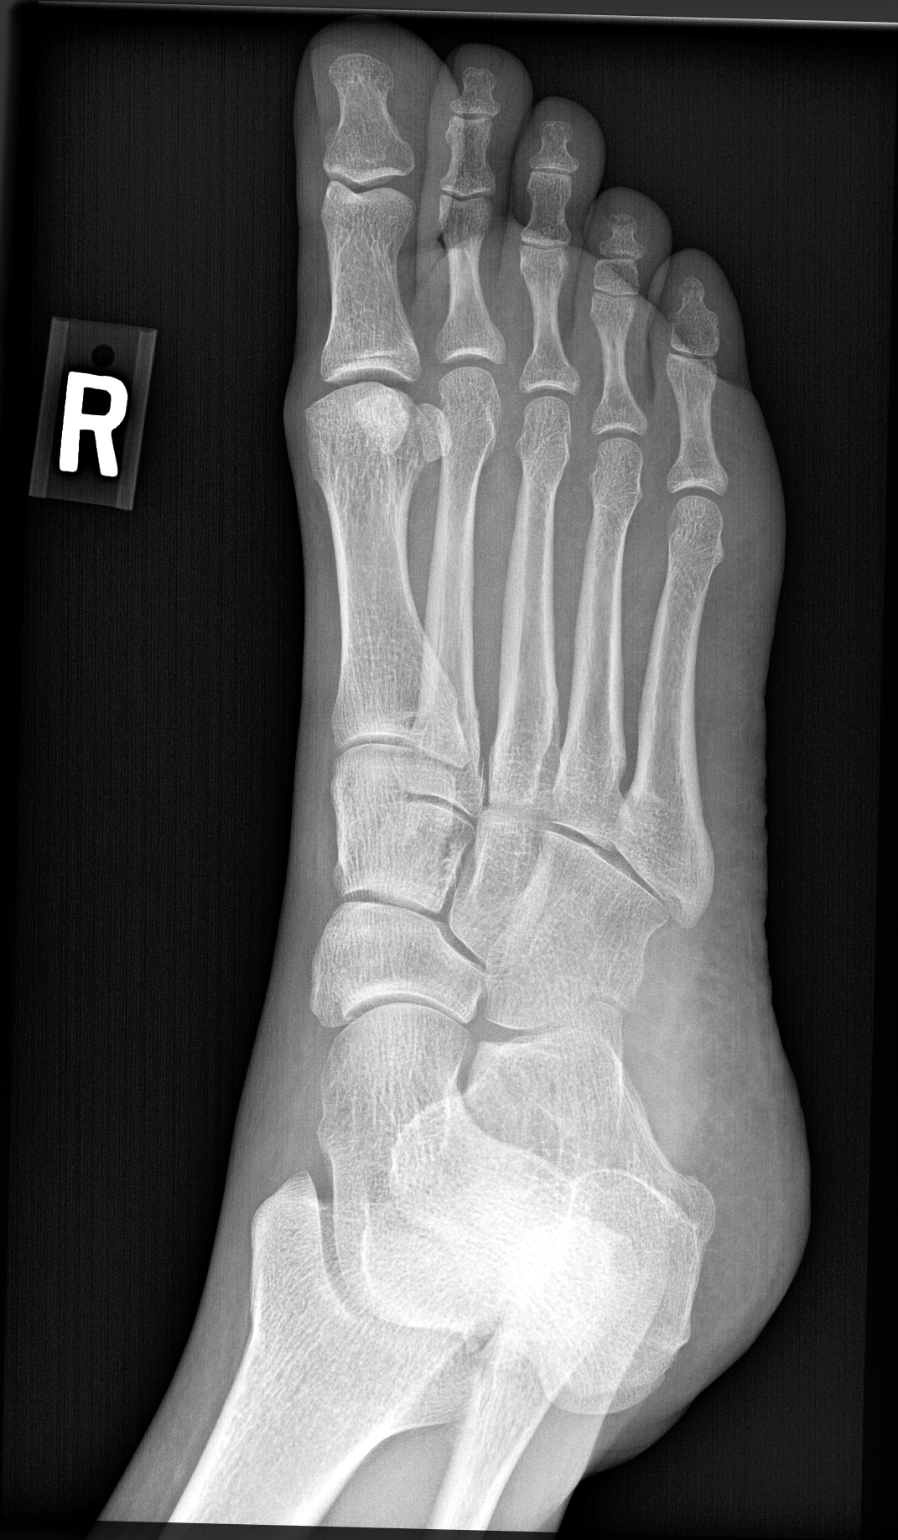

[foot lat]
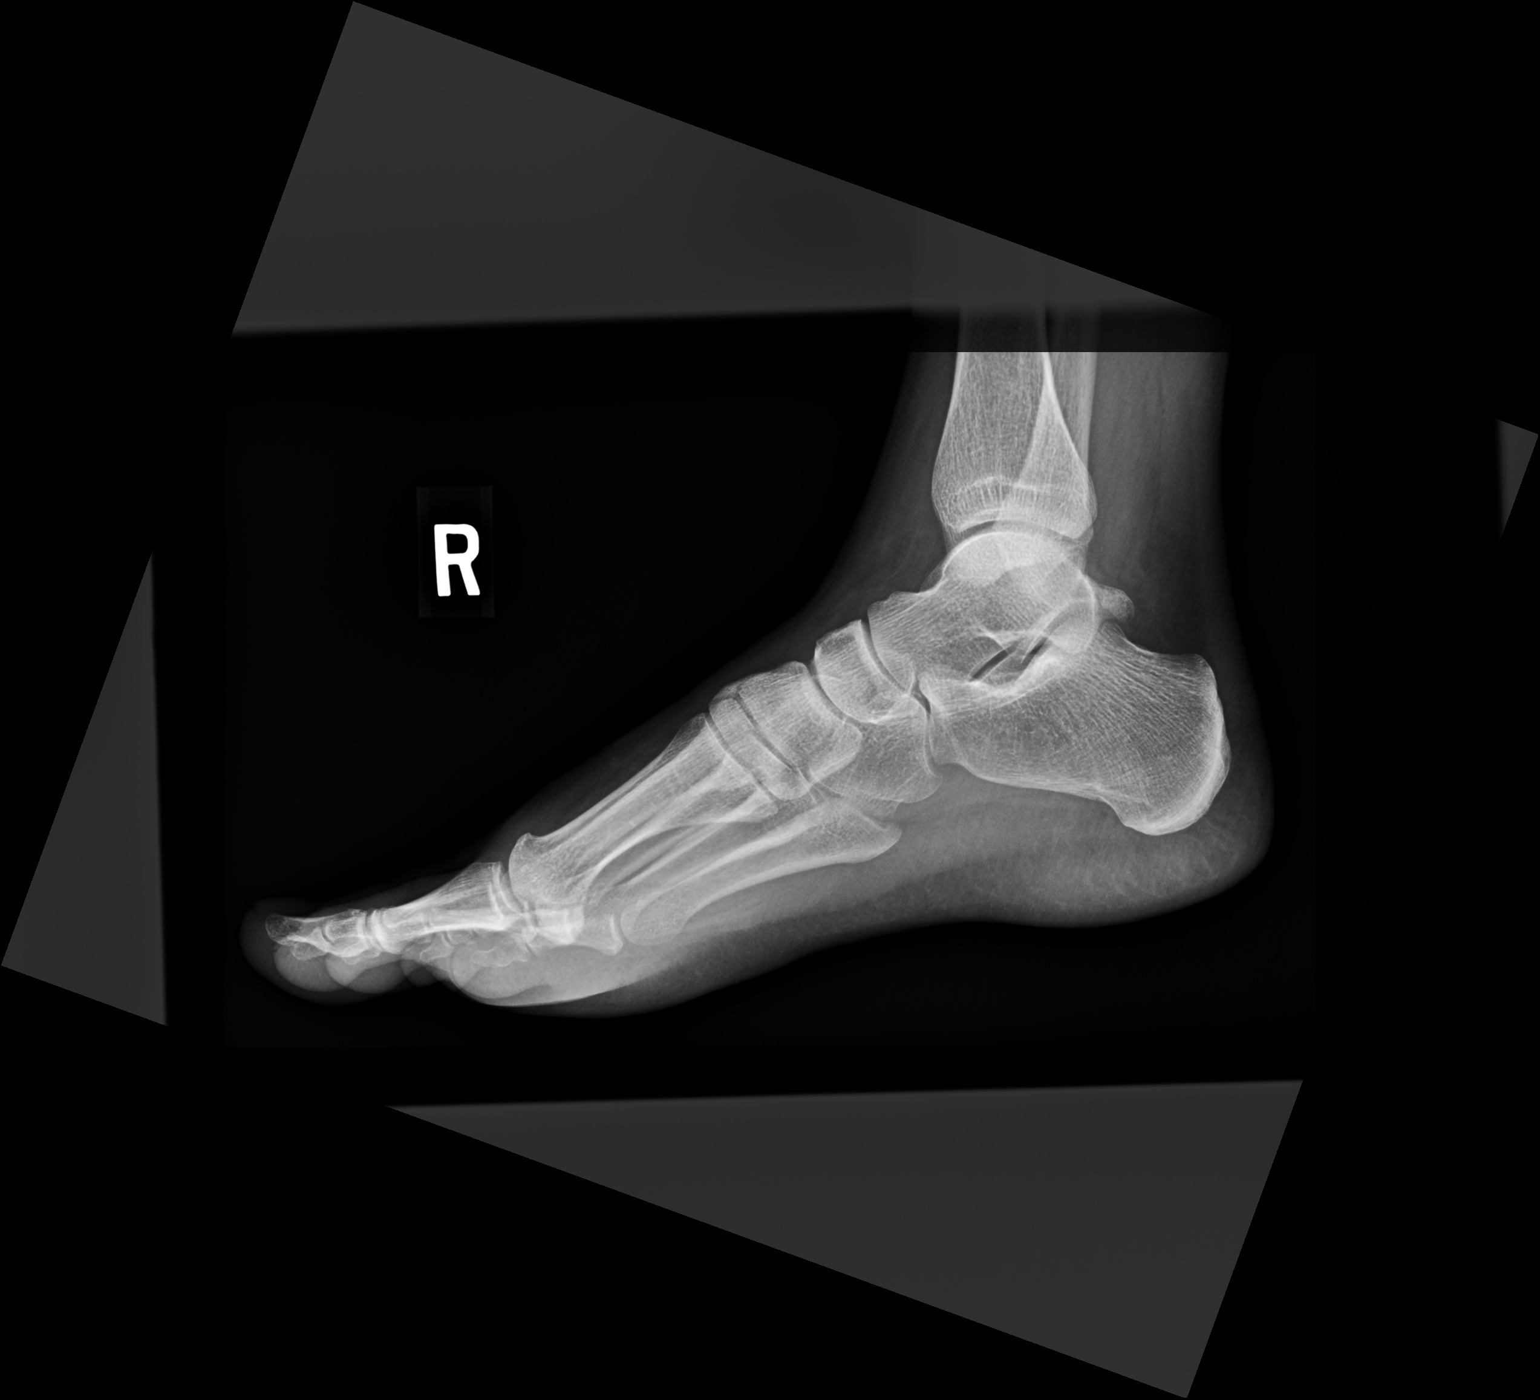

[3 of 3 positions shown; findings below may reference images not displayed]

FINDINGS: Frontal, oblique, and lateral views were obtained. There is no
fracture or dislocation. The joint spaces appear normal. No erosive
change.
IMPRESSION: No fracture or dislocation.  No evident arthropathy.

## 2019-04-15 ENCOUNTER — Other Ambulatory Visit: Payer: Self-pay | Admitting: Internal Medicine

## 2019-05-03 ENCOUNTER — Encounter: Payer: Self-pay | Admitting: *Deleted

## 2019-05-03 ENCOUNTER — Other Ambulatory Visit: Payer: Self-pay

## 2019-05-08 ENCOUNTER — Other Ambulatory Visit
Admission: RE | Admit: 2019-05-08 | Discharge: 2019-05-08 | Disposition: A | Discharge status: Home / Self Care | Payer: Managed Care, Other (non HMO) | Source: Ambulatory Visit | Attending: Gastroenterology | Admitting: Gastroenterology

## 2019-05-08 ENCOUNTER — Other Ambulatory Visit: Payer: Self-pay

## 2019-05-08 DIAGNOSIS — Z01812 Encounter for preprocedural laboratory examination: Secondary | ICD-10-CM | POA: Diagnosis not present

## 2019-05-08 DIAGNOSIS — Z20828 Contact with and (suspected) exposure to other viral communicable diseases: Secondary | ICD-10-CM | POA: Diagnosis not present

## 2019-05-08 LAB — SARS CORONAVIRUS 2 (TAT 6-24 HRS): SARS Coronavirus 2: NEGATIVE

## 2019-05-10 NOTE — Discharge Instructions (Signed)

## 2019-05-10 NOTE — Anesthesia Preprocedure Evaluation (Addendum)
Anesthesia Evaluation  Patient identified by MRN, date of birth, ID band Patient awake    Reviewed: Allergy & Precautions, NPO status , Patient's Chart, lab work & pertinent test results  History of Anesthesia Complications (+) PONV and history of anesthetic complications  Airway Mallampati: I   Neck ROM: Full    Dental  (+)    Pulmonary neg pulmonary ROS,    Pulmonary exam normal breath sounds clear to auscultation       Cardiovascular hypertension, Normal cardiovascular exam Rhythm:Regular Rate:Normal  LBBB   Neuro/Psych PSYCHIATRIC DISORDERS Anxiety HOH    GI/Hepatic hiatal hernia, GERD  ,  Endo/Other  diabetes, Type 2  Renal/GU negative Renal ROS     Musculoskeletal   Abdominal   Peds  Hematology  (+) Blood dyscrasia, anemia ,   Anesthesia Other Findings   Reproductive/Obstetrics                            Anesthesia Physical Anesthesia Plan  ASA: II  Anesthesia Plan: General   Post-op Pain Management:    Induction: Intravenous  PONV Risk Score and Plan: 4 or greater and TIVA, Propofol infusion and Treatment may vary due to age or medical condition  Airway Management Planned: Natural Airway  Additional Equipment:   Intra-op Plan:   Post-operative Plan:   Informed Consent: I have reviewed the patients History and Physical, chart, labs and discussed the procedure including the risks, benefits and alternatives for the proposed anesthesia with the patient or authorized representative who has indicated his/her understanding and acceptance.       Plan Discussed with: CRNA  Anesthesia Plan Comments:        Anesthesia Quick Evaluation

## 2019-05-11 ENCOUNTER — Ambulatory Visit
Admission: RE | Admit: 2019-05-11 | Discharge: 2019-05-11 | Disposition: A | Discharge status: Home / Self Care | Payer: Managed Care, Other (non HMO) | Attending: Gastroenterology | Admitting: Gastroenterology

## 2019-05-11 ENCOUNTER — Other Ambulatory Visit: Payer: Self-pay

## 2019-05-11 ENCOUNTER — Ambulatory Visit: Payer: Managed Care, Other (non HMO) | Admitting: Anesthesiology

## 2019-05-11 ENCOUNTER — Encounter
Admission: RE | Disposition: A | Discharge status: Home / Self Care | Payer: Self-pay | Source: Home / Self Care | Attending: Gastroenterology

## 2019-05-11 DIAGNOSIS — D509 Iron deficiency anemia, unspecified: Secondary | ICD-10-CM | POA: Insufficient documentation

## 2019-05-11 DIAGNOSIS — I10 Essential (primary) hypertension: Secondary | ICD-10-CM | POA: Insufficient documentation

## 2019-05-11 DIAGNOSIS — D125 Benign neoplasm of sigmoid colon: Secondary | ICD-10-CM | POA: Insufficient documentation

## 2019-05-11 DIAGNOSIS — Z79899 Other long term (current) drug therapy: Secondary | ICD-10-CM | POA: Insufficient documentation

## 2019-05-11 DIAGNOSIS — D500 Iron deficiency anemia secondary to blood loss (chronic): Secondary | ICD-10-CM | POA: Diagnosis not present

## 2019-05-11 DIAGNOSIS — K641 Second degree hemorrhoids: Secondary | ICD-10-CM | POA: Insufficient documentation

## 2019-05-11 DIAGNOSIS — Z7984 Long term (current) use of oral hypoglycemic drugs: Secondary | ICD-10-CM | POA: Insufficient documentation

## 2019-05-11 DIAGNOSIS — E119 Type 2 diabetes mellitus without complications: Secondary | ICD-10-CM | POA: Diagnosis not present

## 2019-05-11 DIAGNOSIS — K222 Esophageal obstruction: Secondary | ICD-10-CM | POA: Diagnosis not present

## 2019-05-11 DIAGNOSIS — Z888 Allergy status to other drugs, medicaments and biological substances status: Secondary | ICD-10-CM | POA: Diagnosis not present

## 2019-05-11 DIAGNOSIS — K219 Gastro-esophageal reflux disease without esophagitis: Secondary | ICD-10-CM | POA: Diagnosis not present

## 2019-05-11 DIAGNOSIS — K635 Polyp of colon: Secondary | ICD-10-CM | POA: Diagnosis not present

## 2019-05-11 DIAGNOSIS — K449 Diaphragmatic hernia without obstruction or gangrene: Secondary | ICD-10-CM | POA: Insufficient documentation

## 2019-05-11 HISTORY — PX: ESOPHAGOGASTRODUODENOSCOPY (EGD) WITH PROPOFOL: SHX5813

## 2019-05-11 HISTORY — PX: COLONOSCOPY WITH PROPOFOL: SHX5780

## 2019-05-11 HISTORY — PX: POLYPECTOMY: SHX5525

## 2019-05-11 HISTORY — DX: Presence of dental prosthetic device (complete) (partial): Z97.2

## 2019-05-11 HISTORY — PX: ESOPHAGEAL DILATION: SHX303

## 2019-05-11 LAB — GLUCOSE, CAPILLARY
Glucose-Capillary: 89 mg/dL (ref 70–99)
Glucose-Capillary: 93 mg/dL (ref 70–99)

## 2019-05-11 SURGERY — COLONOSCOPY WITH PROPOFOL
Anesthesia: General | Site: Throat

## 2019-05-11 MED ORDER — GLYCOPYRROLATE 0.2 MG/ML IJ SOLN
INTRAMUSCULAR | Status: DC | PRN
  Administered 2019-05-11: 0.1 mg via INTRAVENOUS

## 2019-05-11 MED ORDER — STERILE WATER FOR IRRIGATION IR SOLN
Status: DC | PRN
  Administered 2019-05-11: .05 mL

## 2019-05-11 MED ORDER — PROPOFOL 10 MG/ML IV BOLUS
INTRAVENOUS | Status: DC | PRN
  Administered 2019-05-11 (×11): 20 mg via INTRAVENOUS
  Administered 2019-05-11: 80 mg via INTRAVENOUS
  Administered 2019-05-11 (×3): 20 mg via INTRAVENOUS

## 2019-05-11 MED ORDER — ACETAMINOPHEN 160 MG/5ML PO SOLN: 325.0000 mg | ORAL | Status: DC | PRN

## 2019-05-11 MED ORDER — ACETAMINOPHEN 325 MG PO TABS: 650.0000 mg | ORAL_TABLET | Freq: Once | ORAL | Status: DC | PRN

## 2019-05-11 MED ORDER — LACTATED RINGERS IV SOLN
10.0000 mL/h | INTRAVENOUS | Status: DC
  Administered 2019-05-11: 09:00:00 via INTRAVENOUS
  Administered 2019-05-11: 08:00:00 10 mL/h via INTRAVENOUS

## 2019-05-11 MED ORDER — LIDOCAINE HCL (CARDIAC) PF 100 MG/5ML IV SOSY
PREFILLED_SYRINGE | INTRAVENOUS | Status: DC | PRN
  Administered 2019-05-11: 50 mg via INTRAVENOUS

## 2019-05-11 MED ORDER — ONDANSETRON HCL 4 MG/2ML IJ SOLN: 4.0000 mg | Freq: Once | INTRAMUSCULAR | Status: DC | PRN

## 2019-05-11 SURGICAL SUPPLY — 11 items
BALLN DILATOR 15-18 8 (BALLOONS) ×6
BALLOON DILATOR 15-18 8 (BALLOONS) IMPLANT
BLOCK BITE 60FR ADLT L/F GRN (MISCELLANEOUS) ×6 IMPLANT
CANISTER SUCT 1200ML W/VALVE (MISCELLANEOUS) ×6 IMPLANT
GOWN CVR UNV OPN BCK APRN NK (MISCELLANEOUS) ×8 IMPLANT
GOWN ISOL THUMB LOOP REG UNIV (MISCELLANEOUS) ×12
KIT ENDO PROCEDURE OLY (KITS) ×6 IMPLANT
SNARE SHORT THROW 13M SML OVAL (MISCELLANEOUS) ×2 IMPLANT
SYR INFLATION 60ML (SYRINGE) ×2 IMPLANT
TRAP ETRAP POLY (MISCELLANEOUS) ×2 IMPLANT
WATER STERILE IRR 250ML POUR (IV SOLUTION) ×6 IMPLANT

## 2019-05-11 NOTE — Transfer of Care (Signed)
Immediate Anesthesia Transfer of Care Note  Patient: Mary Bryant  Procedure(s) Performed: COLONOSCOPY WITH PROPOFOL (N/A Rectum) ESOPHAGOGASTRODUODENOSCOPY (EGD) WITH PROPOFOL (N/A Throat) ESOPHAGEAL DILATION (Throat) POLYPECTOMY (Rectum)  Patient Location: PACU  Anesthesia Type: General  Level of Consciousness: awake, alert  and patient cooperative  Airway and Oxygen Therapy: Patient Spontanous Breathing and Patient connected to supplemental oxygen  Post-op Assessment: Post-op Vital signs reviewed, Patient's Cardiovascular Status Stable, Respiratory Function Stable, Patent Airway and No signs of Nausea or vomiting  Post-op Vital Signs: Reviewed and stable  Complications: No apparent anesthesia complications

## 2019-05-11 NOTE — Op Note (Signed)
[facility] Gastroenterology Patient Name: Mary Bryant Procedure Date: 05/11/2019 8:39 AM MRN: [id number] Account #: [id number] Date of Birth: [birth year] Admit Type: Outpatient Age: 53 Room: [facility] OR ROOM 01 Gender: Female Note Status: Finalized Procedure:            Colonoscopy Indications:          Iron deficiency anemia Providers:            Lucilla Lame MD, MD Medicines:            Propofol per Anesthesia Complications:        No immediate complications. Procedure:            Pre-Anesthesia Assessment:                       - Prior to the procedure, a History and Physical was                        performed, and patient medications and allergies were                        reviewed. The patient's tolerance of previous                        anesthesia was also reviewed. The risks and benefits of                        the procedure and the sedation options and risks were                        discussed with the patient. All questions were                        answered, and informed consent was obtained. Prior                        Anticoagulants: The patient has taken no previous                        anticoagulant or antiplatelet agents. ASA Grade                        Assessment: II - A patient with mild systemic disease.                        After reviewing the risks and benefits, the patient was                        deemed in satisfactory condition to undergo the                        procedure.                       After obtaining informed consent, the colonoscope was                        passed under direct vision. Throughout the procedure,                        the patient's blood pressure, pulse, and  oxygen                        saturations were monitored continuously. The was                        introduced through the anus and advanced to the the                        cecum, identified by appendiceal orifice and ileocecal                         valve. The colonoscopy was performed without                        difficulty. The patient tolerated the procedure well.                        The quality of the bowel preparation was excellent. Findings:      The perianal and digital rectal examinations were normal.      A 5 mm polyp was found in the sigmoid colon. The polyp was pedunculated.       The polyp was removed with a cold snare. Resection and retrieval were       complete.      Non-bleeding internal hemorrhoids were found during retroflexion. The       hemorrhoids were Grade II (internal hemorrhoids that prolapse but reduce       spontaneously). Impression:           - One 5 mm polyp in the sigmoid colon, removed with a                        cold snare. Resected and retrieved.                       - Non-bleeding internal hemorrhoids. Recommendation:       - Discharge patient to home.                       - Resume previous diet.                       - Continue present medications.                       - Await pathology results.                       - Repeat colonoscopy in 5 years if polyp adenoma and 10                        years if hyperplastic                       - To visualize the small bowel, perform video capsule                        endoscopy. Procedure Code(s):    --- Professional ---                       [phone], Colonoscopy, flexible; with removal of tumor(s),  polyp(s), or other lesion(s) by snare technique Diagnosis Code(s):    --- Professional ---                       D50.9, Iron deficiency anemia, unspecified                       K63.5, Polyp of colon CPT copyright 2019 [employer]. All rights reserved. The codes documented in this report are preliminary and upon coder review may  be revised to meet current compliance requirements. Lucilla Lame MD, MD 05/11/2019 9:15:11 AM This report has been signed electronically. Number of Addenda:  0 Note Initiated On: 05/11/2019 8:39 AM Scope Withdrawal Time: 0 hours 9 minutes 34 seconds  Total Procedure Duration: 0 hours 12 minutes 42 seconds  Estimated Blood Loss: Estimated blood loss: none.      [facility]

## 2019-05-11 NOTE — Anesthesia Procedure Notes (Deleted)
Performed by: Canary Fister, CRNA Pre-anesthesia Checklist: Patient identified, Emergency Drugs available, Suction available, Timeout performed and Patient being monitored Patient Re-evaluated:Patient Re-evaluated prior to induction Oxygen Delivery Method: Nasal cannula Placement Confirmation: positive ETCO2       

## 2019-05-11 NOTE — Op Note (Signed)
[facility] Gastroenterology Patient Name: Mary Bryant Procedure Date: 05/11/2019 8:40 AM MRN: [id number] Account #: [id number] Date of Birth: 02/02/1966 Admit Type: Outpatient Age: 53 Room: [facility] OR ROOM 01 Gender: Female Note Status: Finalized Procedure:            Upper GI endoscopy Indications:          Iron deficiency anemia Providers:            Lucilla Lame MD, MD Referring MD:         Deborra Medina, MD (Referring MD) Medicines:            Propofol per Anesthesia Complications:        No immediate complications. Procedure:            Pre-Anesthesia Assessment:                       - Prior to the procedure, a History and Physical was                        performed, and patient medications and allergies were                        reviewed. The patient's tolerance of previous                        anesthesia was also reviewed. The risks and benefits of                        the procedure and the sedation options and risks were                        discussed with the patient. All questions were                        answered, and informed consent was obtained. Prior                        Anticoagulants: The patient has taken no previous                        anticoagulant or antiplatelet agents. ASA Grade                        Assessment: II - A patient with mild systemic disease.                        After reviewing the risks and benefits, the patient was                        deemed in satisfactory condition to undergo the                        procedure.                       After obtaining informed consent, the endoscope was                        passed under direct vision. Throughout the procedure,  the patient's blood pressure, pulse, and oxygen                        saturations were monitored continuously. The was                        introduced through the mouth, and advanced to the   second part of duodenum. The upper GI endoscopy was                        accomplished without difficulty. The patient tolerated                        the procedure well. Findings:      A medium-sized hiatal hernia was present.      One benign-appearing, intrinsic mild stenosis was found at the       gastroesophageal junction. The stenosis was traversed. A TTS dilator was       passed through the scope. Dilation with a 15-16.5-18 mm balloon dilator       was performed to 18 mm. The dilation site was examined following       endoscope reinsertion and showed complete resolution of luminal       narrowing.      The stomach was normal.      The examined duodenum was normal. Impression:           - Medium-sized hiatal hernia.                       - Benign-appearing esophageal stenosis. Dilated.                       - Normal stomach.                       - Normal examined duodenum.                       - No specimens collected. Recommendation:       - Discharge patient to home.                       - Resume previous diet.                       - Continue present medications.                       - Perform a colonoscopy today. Procedure Code(s):    --- Professional ---                       [phone], Esophagogastroduodenoscopy, flexible, transoral;                        with transendoscopic balloon dilation of esophagus                        (less than 30 mm diameter) Diagnosis Code(s):    --- Professional ---                       D50.9, Iron deficiency anemia, unspecified  K22.2, Esophageal obstruction CPT copyright 2019 American Medical Association. All rights reserved. The codes documented in this report are preliminary and upon coder review may  be revised to meet current compliance requirements. Lucilla Lame MD, MD 05/11/2019 8:58:43 AM This report has been signed electronically. Number of Addenda: 0 Note Initiated On: 05/11/2019 8:40 AM Total Procedure  Duration: 0 hours 4 minutes 39 seconds  Estimated Blood Loss: Estimated blood loss: none.      [facility]

## 2019-05-11 NOTE — Anesthesia Procedure Notes (Signed)
Performed by: Deral Schellenberg, CRNA Pre-anesthesia Checklist: Patient identified, Emergency Drugs available, Suction available, Timeout performed and Patient being monitored Patient Re-evaluated:Patient Re-evaluated prior to induction Oxygen Delivery Method: Nasal cannula Placement Confirmation: positive ETCO2       

## 2019-05-11 NOTE — H&P (Signed)
Mary Lame, MD [facility] [address] [place], [place] [address] Phone:[phone] Fax : [phone]  Primary Care Physician:  Mary Mc, MD Primary Gastroenterologist:  Dr. Allen Norris  Pre-Procedure History & Physical: HPI:  Mary Bryant is a 53 y.o. female is here for an endoscopy and colonoscopy.   Past Medical History:  Diagnosis Date  . Dental bridge present    No bridge.  Implant - top left  . Esophageal stricture    dilated 2009,  [place]  . GERD (gastroesophageal reflux disease)   . History of hiatal hernia   . Hypertension    no prior treatment  . Insomnia    chronic, no prior sleep study  . Left bundle branch block   . PONV (postoperative nausea and vomiting)   . rhinitis allergic   . Trigeminal neuralgia   . Wears contact lenses   . Wears hearing aid    bilateral    Past Surgical History:  Procedure Laterality Date  . ABDOMINAL HYSTERECTOMY  2000   no history of ca   . CHOLECYSTECTOMY  2008  . ESOPHAGOGASTRODUODENOSCOPY (EGD) WITH PROPOFOL N/A 05/12/2016   Procedure: ESOPHAGOGASTRODUODENOSCOPY (EGD) WITH PROPOFOL;  Surgeon: Mary Bellows, MD;  Location: [facility];  Service: Endoscopy;  Laterality: N/A;    Prior to Admission medications   Medication Sig Start Date End Date Taking? Authorizing Provider  acetaminophen (TYLENOL) 500 MG tablet Take 1,000 mg by mouth every 6 (six) hours as needed for mild pain.   Yes [provider]  ALPRAZolam Duanne Moron) 0.5 MG tablet Take 0.5 mg by mouth See admin instructions. 04/10/18  Yes [provider]  betamethasone valerate (VALISONE) 0.1 % cream Apply topically 2 (two) times daily. 03/02/19  Yes Mary Mc, MD  cyanocobalamin (,VITAMIN B-12,) 1000 MCG/ML injection INJECT 1000 MCG IM WEEKLY X 3, THEN MONTHLY THEREAFTER 07/03/18  Yes Mary Mc, MD  metFORMIN (GLUCOPHAGE) 500 MG tablet TAKE 1 TABLET (500 MG TOTAL) BY MOUTH 2 (TWO) TIMES DAILY WITH A MEAL. 01/08/19  Yes Mary Mc, MD  pantoprazole (PROTONIX) 40 MG tablet TAKE 1 TABLET (40 MG TOTAL) BY MOUTH DAILY 04/16/19  Yes Mary Mc, MD  Syringe/Needle, Disp, (SYRINGE 3CC/25GX1") 25G X 1" 3 ML MISC Use for b12 injections 01/04/18   Mary Mc, MD    Allergies as of 03/13/2019 - Review Complete 02/26/2019  Allergen Reaction Noted  . Promethazine Anaphylaxis 04/04/2015  . Benadryl [diphenhydramine hcl] Hives 05/17/2011  . Phenothiazines Other (See Comments) 10/25/2013    Family History  Problem Relation Age of Onset  . Cancer Father        Bladder,  in Hospice  . Diabetes type II Father   . COPD Father   . Coronary artery disease Father   . Heart disease Father 80  . Diabetes Mother   . Hyperlipidemia Mother   . Hypertension Mother   . Early death Paternal Grandmother   . Heart disease Paternal Grandmother        CAD  . Prostate cancer Paternal Grandfather     Social History   Socioeconomic History  . Marital status: Single    Spouse name: Keili Sferra  . Number of children: 3  . Years of education: 83  . Highest education level: Not on file  Occupational History  . Occupation: Investment banker, corporate  . Financial resource strain: Not on file  . Food insecurity    Worry: Not on file  Inability: Not on file  . Transportation needs    Medical: Not on file    Non-medical: Not on file  Tobacco Use  . Smoking status: Never Smoker  . Smokeless tobacco: Never Used  Substance and Sexual Activity  . Alcohol use: No  . Drug use: No  . Sexual activity: Not Currently  Lifestyle  . Physical activity    Days per week: Not on file    Minutes per session: Not on file  . Stress: Not on file  Relationships  . Social Herbalist on phone: Not on file    Gets together: Not on file    Attends religious service: Not on file    Active member of club or organization: Not on file    Attends meetings of clubs or organizations: Not on file    Relationship status: Not on  file  . Intimate partner violence    Fear of current or ex partner: Not on file    Emotionally abused: Not on file    Physically abused: Not on file    Forced sexual activity: Not on file  Other Topics Concern  . Not on file  Social History Narrative   Lives at home alone.   Right-handed.   4 cups caffeine per day.    Review of Systems: See HPI, otherwise negative ROS  Physical Exam: BP 137/87   Pulse 72   Temp 97.8 F (36.6 C) (Temporal)   Resp 16   Ht 5\' 7"  (1.702 m)   Wt 84.8 kg   SpO2 98%   BMI 29.29 kg/m  General:   Alert,  pleasant and cooperative in NAD Head:  Normocephalic and atraumatic. Neck:  Supple; no masses or thyromegaly. Lungs:  Clear throughout to auscultation.    Heart:  Regular rate and rhythm. Abdomen:  Soft, nontender and nondistended. Normal bowel sounds, without guarding, and without rebound.   Neurologic:  Alert and  oriented x4;  grossly normal neurologically.  Impression/Plan: [name] is here for an endoscopy and colonoscopy to be performed for IDA  Risks, benefits, limitations, and alternatives regarding  endoscopy and colonoscopy have been reviewed with the patient.  Questions have been answered.  All parties agreeable.   Mary Lame, MD  05/11/2019, 7:49 AM

## 2019-05-11 NOTE — Anesthesia Postprocedure Evaluation (Signed)
Anesthesia Post Note  Patient: Mary Bryant  Procedure(s) Performed: COLONOSCOPY WITH PROPOFOL (N/A Rectum) ESOPHAGOGASTRODUODENOSCOPY (EGD) WITH PROPOFOL (N/A Throat) ESOPHAGEAL DILATION (Throat) POLYPECTOMY (Rectum)  Patient location during evaluation: PACU Anesthesia Type: General Level of consciousness: awake and alert, oriented and patient cooperative Pain management: pain level controlled Vital Signs Assessment: post-procedure vital signs reviewed and stable Respiratory status: spontaneous breathing, nonlabored ventilation and respiratory function stable Cardiovascular status: blood pressure returned to baseline and stable Postop Assessment: adequate PO intake Anesthetic complications: no    Darrin Nipper

## 2019-05-14 ENCOUNTER — Encounter: Payer: Self-pay | Admitting: Gastroenterology

## 2019-05-14 ENCOUNTER — Telehealth: Payer: Self-pay

## 2019-05-14 NOTE — Telephone Encounter (Signed)
Pt called today stating she had a colonoscopy and EGD done on Friday. She stated she started feeling really bad by Sunday and having abdominal discomfort. She stated its almost like she has the flu. No fever, vomiting, nausea or rectal bleeding. Almost like there is pressure in her lower abdomen. Advised pt this could be residual gas from the colonoscopy. She can try using a heating pad and take Tylenol or Ibuprofen as directed on the bottle for a couple of days. If no improvement, she is to call the office. If worsening pain, then she need to go to the ER to be evaluated.

## 2019-05-16 ENCOUNTER — Encounter: Payer: Self-pay | Admitting: Gastroenterology

## 2019-05-17 ENCOUNTER — Encounter: Payer: Self-pay | Admitting: Gastroenterology

## 2019-10-01 NOTE — Telephone Encounter (Signed)
Pt is calling about MyChart message and would like a call back today. Pt states that she is stressed and wants something called in asap. Pt states that she has not slept much in three weeks.

## 2019-10-02 NOTE — Telephone Encounter (Signed)
Spoke with pt and she has been scheduled with Dr. Aundra Dubin tomorrow at 11:30am.

## 2019-10-03 ENCOUNTER — Ambulatory Visit: Payer: Managed Care, Other (non HMO) | Admitting: Internal Medicine

## 2019-10-09 ENCOUNTER — Other Ambulatory Visit: Payer: Self-pay

## 2019-10-09 ENCOUNTER — Encounter: Payer: Self-pay | Admitting: Internal Medicine

## 2019-10-09 ENCOUNTER — Ambulatory Visit: Payer: Managed Care, Other (non HMO) | Admitting: Internal Medicine

## 2019-10-09 VITALS — BP 150/90 | HR 85 | Temp 97.2°F | Ht 67.0 in | Wt 192.6 lb

## 2019-10-09 DIAGNOSIS — F329 Major depressive disorder, single episode, unspecified: Secondary | ICD-10-CM

## 2019-10-09 DIAGNOSIS — G4700 Insomnia, unspecified: Secondary | ICD-10-CM | POA: Diagnosis not present

## 2019-10-09 DIAGNOSIS — R030 Elevated blood-pressure reading, without diagnosis of hypertension: Secondary | ICD-10-CM

## 2019-10-09 DIAGNOSIS — F419 Anxiety disorder, unspecified: Secondary | ICD-10-CM | POA: Diagnosis not present

## 2019-10-09 DIAGNOSIS — F32A Depression, unspecified: Secondary | ICD-10-CM

## 2019-10-09 MED ORDER — TRAZODONE HCL 50 MG PO TABS: 25.0000 mg | ORAL_TABLET | Freq: Every evening | ORAL | 2 refills | Status: DC | PRN

## 2019-10-09 MED ORDER — HYDROXYZINE HCL 25 MG PO TABS: 25.0000 mg | ORAL_TABLET | Freq: Every evening | ORAL | 2 refills | Status: DC | PRN

## 2019-10-09 MED ORDER — ALPRAZOLAM 0.25 MG PO TABS: 0.2500 mg | ORAL_TABLET | Freq: Every day | ORAL | 0 refills | Status: DC | PRN

## 2019-10-09 NOTE — Progress Notes (Signed)
When attempting to go to sleep the patient is in a completly dark room. No screens are on and states her bed and pillows are comfortable. She stops eating at 6 pm, does not exercise at night, does not consume caffeine throughout the day. States she feels wired all day and night as if she has a lot of energy and adrenaline. Patient is very jittery and speaking quickly, restless.   States GAD-7 (17) and PHQ-9 (12) scores are up because she is tired/restless and not getting any sleep. States that although she is tired she can not fall or stay asleep. Patient states she has not slept longer than an hour at a time in the last 3 weeks.

## 2019-10-09 NOTE — Patient Instructions (Addendum)
apps for sleep Replika, Bloom, Calm, Insight Timer, Headspace  Nature Made L theanine 100-200 mg at night  Follow up therapy    MY CHART IN 1-2 WEEKS HOW YOU ARE DOING TO DR. Derrel Nip IF NOT BETTER CONSIDER PSYCHIATRY   Insomnia Insomnia is a sleep disorder that makes it difficult to fall asleep or stay asleep. Insomnia can cause fatigue, low energy, difficulty concentrating, mood swings, and poor performance at work or school. There are three different ways to classify insomnia:  Difficulty falling asleep.  Difficulty staying asleep.  Waking up too early in the morning. Any type of insomnia can be long-term (chronic) or short-term (acute). Both are common. Short-term insomnia usually lasts for three months or less. Chronic insomnia occurs at least three times a week for longer than three months. What are the causes? Insomnia may be caused by another condition, situation, or substance, such as:  Anxiety.  Certain medicines.  Gastroesophageal reflux disease (GERD) or other gastrointestinal conditions.  Asthma or other breathing conditions.  Restless legs syndrome, sleep apnea, or other sleep disorders.  Chronic pain.  Menopause.  Stroke.  Abuse of alcohol, tobacco, or illegal drugs.  Mental health conditions, such as depression.  Caffeine.  Neurological disorders, such as Alzheimer's disease.  An overactive thyroid (hyperthyroidism). Sometimes, the cause of insomnia may not be known. What increases the risk? Risk factors for insomnia include:  Gender. Women are affected more often than men.  Age. Insomnia is more common as you get older.  Stress.  Lack of exercise.  Irregular work schedule or working night shifts.  Traveling between different time zones.  Certain medical and mental health conditions. What are the signs or symptoms? If you have insomnia, the main symptom is having trouble falling asleep or having trouble staying asleep. This may lead to  other symptoms, such as:  Feeling fatigued or having low energy.  Feeling nervous about going to sleep.  Not feeling rested in the morning.  Having trouble concentrating.  Feeling irritable, anxious, or depressed. How is this diagnosed? This condition may be diagnosed based on:  Your symptoms and medical history. Your health care provider may ask about: ? Your sleep habits. ? Any medical conditions you have. ? Your mental health.  A physical exam. How is this treated? Treatment for insomnia depends on the cause. Treatment may focus on treating an underlying condition that is causing insomnia. Treatment may also include:  Medicines to help you sleep.  Counseling or therapy.  Lifestyle adjustments to help you sleep better. Follow these instructions at home: Eating and drinking   Limit or avoid alcohol, caffeinated beverages, and cigarettes, especially close to bedtime. These can disrupt your sleep.  Do not eat a large meal or eat spicy foods right before bedtime. This can lead to digestive discomfort that can make it hard for you to sleep. Sleep habits   Keep a sleep diary to help you and your health care provider figure out what could be causing your insomnia. Write down: ? When you sleep. ? When you wake up during the night. ? How well you sleep. ? How rested you feel the next day. ? Any side effects of medicines you are taking. ? What you eat and drink.  Make your bedroom a dark, comfortable place where it is easy to fall asleep. ? Put up shades or blackout curtains to block light from outside. ? Use a white noise machine to block noise. ? Keep the temperature cool.  Limit screen  use before bedtime. This includes: ? Watching TV. ? Using your smartphone, tablet, or computer.  Stick to a routine that includes going to bed and waking up at the same times every day and night. This can help you fall asleep faster. Consider making a quiet activity, such as reading,  part of your nighttime routine.  Try to avoid taking naps during the day so that you sleep better at night.  Get out of bed if you are still awake after 15 minutes of trying to sleep. Keep the lights down, but try reading or doing a quiet activity. When you feel sleepy, go back to bed. General instructions  Take over-the-counter and prescription medicines only as told by your health care provider.  Exercise regularly, as told by your health care provider. Avoid exercise starting several hours before bedtime.  Use relaxation techniques to manage stress. Ask your health care provider to suggest some techniques that may work well for you. These may include: ? Breathing exercises. ? Routines to release muscle tension. ? Visualizing peaceful scenes.  Make sure that you drive carefully. Avoid driving if you feel very sleepy.  Keep all follow-up visits as told by your health care provider. This is important. Contact a health care provider if:  You are tired throughout the day.  You have trouble in your daily routine due to sleepiness.  You continue to have sleep problems, or your sleep problems get worse. Get help right away if:  You have serious thoughts about hurting yourself or someone else. If you ever feel like you may hurt yourself or others, or have thoughts about taking your own life, get help right away. You can go to your nearest emergency department or call:  Your local emergency services (911 in the [place]  A suicide crisis helpline, such as the [place] at (808)006-3263. This is open 24 hours a day. Summary  Insomnia is a sleep disorder that makes it difficult to fall asleep or stay asleep.  Insomnia can be long-term (chronic) or short-term (acute).  Treatment for insomnia depends on the cause. Treatment may focus on treating an underlying condition that is causing insomnia.  Keep a sleep diary to help you and your health care provider  figure out what could be causing your insomnia. This information is not intended to replace advice given to you by your health care provider. Make sure you discuss any questions you have with your health care provider. Document Revised: 06/17/2017 Document Reviewed: 04/14/2017 Elsevier Patient Education  [place].  Hydroxyzine capsules or tablets What is this medicine? HYDROXYZINE (hye [place] i zeen) is an antihistamine. This medicine is used to treat allergy symptoms. It is also used to treat anxiety and tension. This medicine can be used with other medicines to induce sleep before surgery. This medicine may be used for other purposes; ask your health care provider or pharmacist if you have questions. COMMON BRAND NAME(S): ANX, Atarax, Rezine, Vistaril What should I tell my health care provider before I take this medicine? They need to know if you have any of these conditions:  glaucoma  heart disease  history of irregular heartbeat  kidney disease  liver disease  lung or breathing disease, like asthma  stomach or intestine problems  thyroid disease  trouble passing urine  an unusual or allergic reaction to hydroxyzine, cetirizine, other medicines, foods, dyes or preservatives  pregnant or trying to get pregnant  breast-feeding How should I use this medicine? Take this  medicine by mouth with a full glass of water. Follow the directions on the prescription label. You may take this medicine with food or on an empty stomach. Take your medicine at regular intervals. Do not take your medicine more often than directed. Talk to your pediatrician regarding the use of this medicine in children. Special care may be needed. While this drug may be prescribed for children as young as 28 years of age for selected conditions, precautions do apply. Patients over 3 years old may have a stronger reaction and need a smaller dose. Overdosage: If you think you have taken too much of this  medicine contact a poison control center or emergency room at once. NOTE: This medicine is only for you. Do not share this medicine with others. What if I miss a dose? If you miss a dose, take it as soon as you can. If it is almost time for your next dose, take only that dose. Do not take double or extra doses. What may interact with this medicine? Do not take this medicine with any of the following medications:  cisapride  dronedarone  pimozide  thioridazine This medicine may also interact with the following medications:  alcohol  antihistamines for allergy, cough, and cold  atropine  barbiturate medicines for sleep or seizures, like phenobarbital  certain antibiotics like erythromycin or clarithromycin  certain medicines for anxiety or sleep  certain medicines for bladder problems like oxybutynin, tolterodine  certain medicines for depression or psychotic disturbances  certain medicines for irregular heart beat  certain medicines for Parkinson's disease like benztropine, trihexyphenidyl  certain medicines for seizures like phenobarbital, primidone  certain medicines for stomach problems like dicyclomine, hyoscyamine  certain medicines for travel sickness like scopolamine  ipratropium  narcotic medicines for pain  other medicines that prolong the QT interval (an abnormal heart rhythm) like dofetilide This list may not describe all possible interactions. Give your health care provider a list of all the medicines, herbs, non-prescription drugs, or dietary supplements you use. Also tell them if you smoke, drink alcohol, or use illegal drugs. Some items may interact with your medicine. What should I watch for while using this medicine? Tell your doctor or health care professional if your symptoms do not improve. You may get drowsy or dizzy. Do not drive, use machinery, or do anything that needs mental alertness until you know how this medicine affects you. Do not stand  or sit up quickly, especially if you are an older patient. This reduces the risk of dizzy or fainting spells. Alcohol may interfere with the effect of this medicine. Avoid alcoholic drinks. Your mouth may get dry. Chewing sugarless gum or sucking hard candy, and drinking plenty of water may help. Contact your doctor if the problem does not go away or is severe. This medicine may cause dry eyes and blurred vision. If you wear contact lenses you may feel some discomfort. Lubricating drops may help. See your eye doctor if the problem does not go away or is severe. If you are receiving skin tests for allergies, tell your doctor you are using this medicine. What side effects may I notice from receiving this medicine? Side effects that you should report to your doctor or health care professional as soon as possible:  allergic reactions like skin rash, itching or hives, swelling of the face, lips, or tongue  changes in vision  confusion  fast, irregular heartbeat  seizures  tremor  trouble passing urine or change in the amount of  urine Side effects that usually do not require medical attention (report to your doctor or health care professional if they continue or are bothersome):  constipation  drowsiness  dry mouth  headache  tiredness This list may not describe all possible side effects. Call your doctor for medical advice about side effects. You may report side effects to FDA at 1-800-FDA-1088. Where should I keep my medicine? Keep out of the reach of children. Store at room temperature between 15 and 30 degrees C (59 and 86 degrees F). Keep container tightly closed. Throw away any unused medicine after the expiration date. NOTE: This sheet is a summary. It may not cover all possible information. If you have questions about this medicine, talk to your doctor, pharmacist, or health care provider.  2020 Elsevier/Gold Standard (2018-06-26 13:19:55)  Trazodone tablets What is this  medicine? TRAZODONE (TRAZ oh done) is used to treat depression. This medicine may be used for other purposes; ask your health care provider or pharmacist if you have questions. COMMON BRAND NAME(S): Desyrel What should I tell my health care provider before I take this medicine? They need to know if you have any of these conditions:  attempted suicide or thinking about it  bipolar disorder  bleeding problems  glaucoma  heart disease, or previous heart attack  irregular heart beat  kidney or liver disease  low levels of sodium in the blood  an unusual or allergic reaction to trazodone, other medicines, foods, dyes or preservatives  pregnant or trying to get pregnant  breast-feeding How should I use this medicine? Take this medicine by mouth with a glass of water. Follow the directions on the prescription label. Take this medicine shortly after a meal or a light snack. Take your medicine at regular intervals. Do not take your medicine more often than directed. Do not stop taking this medicine suddenly except upon the advice of your doctor. Stopping this medicine too quickly may cause serious side effects or your condition may worsen. A special MedGuide will be given to you by the pharmacist with each prescription and refill. Be sure to read this information carefully each time. Talk to your pediatrician regarding the use of this medicine in children. Special care may be needed. Overdosage: If you think you have taken too much of this medicine contact a poison control center or emergency room at once. NOTE: This medicine is only for you. Do not share this medicine with others. What if I miss a dose? If you miss a dose, take it as soon as you can. If it is almost time for your next dose, take only that dose. Do not take double or extra doses. What may interact with this medicine? Do not take this medicine with any of the following medications:  certain medicines for fungal  infections like fluconazole, itraconazole, ketoconazole, posaconazole, voriconazole  cisapride  dronedarone  linezolid  MAOIs like Carbex, Eldepryl, Marplan, Nardil, and Parnate  mesoridazine  methylene blue (injected into a vein)  pimozide  saquinavir  thioridazine This medicine may also interact with the following medications:  alcohol  antiviral medicines for HIV or AIDS  aspirin and aspirin-like medicines  barbiturates like phenobarbital  certain medicines for blood pressure, heart disease, irregular heart beat  certain medicines for depression, anxiety, or psychotic disturbances  certain medicines for migraine headache like almotriptan, eletriptan, frovatriptan, naratriptan, rizatriptan, sumatriptan, zolmitriptan  certain medicines for seizures like carbamazepine and phenytoin  certain medicines for sleep  certain medicines that treat or prevent  blood clots like dalteparin, enoxaparin, warfarin  digoxin  fentanyl  lithium  NSAIDS, medicines for pain and inflammation, like ibuprofen or naproxen  other medicines that prolong the QT interval (cause an abnormal heart rhythm) like dofetilide  rasagiline  supplements like St. John's wort, kava kava, valerian  tramadol  tryptophan This list may not describe all possible interactions. Give your health care provider a list of all the medicines, herbs, non-prescription drugs, or dietary supplements you use. Also tell them if you smoke, drink alcohol, or use illegal drugs. Some items may interact with your medicine. What should I watch for while using this medicine? Tell your doctor if your symptoms do not get better or if they get worse. Visit your doctor or health care professional for regular checks on your progress. Because it may take several weeks to see the full effects of this medicine, it is important to continue your treatment as prescribed by your doctor. Patients and their families should watch out  for new or worsening thoughts of suicide or depression. Also watch out for sudden changes in feelings such as feeling anxious, agitated, panicky, irritable, hostile, aggressive, impulsive, severely restless, overly excited and hyperactive, or not being able to sleep. If this happens, especially at the beginning of treatment or after a change in dose, call your health care professional. Dennis Bast may get drowsy or dizzy. Do not drive, use machinery, or do anything that needs mental alertness until you know how this medicine affects you. Do not stand or sit up quickly, especially if you are an older patient. This reduces the risk of dizzy or fainting spells. Alcohol may interfere with the effect of this medicine. Avoid alcoholic drinks. This medicine may cause dry eyes and blurred vision. If you wear contact lenses you may feel some discomfort. Lubricating drops may help. See your eye doctor if the problem does not go away or is severe. Your mouth may get dry. Chewing sugarless gum, sucking hard candy and drinking plenty of water may help. Contact your doctor if the problem does not go away or is severe. What side effects may I notice from receiving this medicine? Side effects that you should report to your doctor or health care professional as soon as possible:  allergic reactions like skin rash, itching or hives, swelling of the face, lips, or tongue  elevated mood, decreased need for sleep, racing thoughts, impulsive behavior  confusion  fast, irregular heartbeat  feeling faint or lightheaded, falls  feeling agitated, angry, or irritable  loss of balance or coordination  painful or prolonged erections  restlessness, pacing, inability to keep still  suicidal thoughts or other mood changes  tremors  trouble sleeping  seizures  unusual bleeding or bruising Side effects that usually do not require medical attention (report to your doctor or health care professional if they continue or are  bothersome):  change in sex drive or performance  change in appetite or weight  constipation  headache  muscle aches or pains  nausea This list may not describe all possible side effects. Call your doctor for medical advice about side effects. You may report side effects to FDA at 1-800-FDA-1088. Where should I keep my medicine? Keep out of the reach of children. Store at room temperature between 15 and 30 degrees C (59 to 86 degrees F). Protect from light. Keep container tightly closed. Throw away any unused medicine after the expiration date. NOTE: This sheet is a summary. It may not cover all possible information. If  you have questions about this medicine, talk to your doctor, pharmacist, or health care provider.  2020 Elsevier/Gold Standard (2018-06-27 11:46:46)

## 2019-10-09 NOTE — Progress Notes (Signed)
Chief Complaint  Patient presents with  . Insomnia    Ongoing, patient would sleep on and off for 4-5 hours a night. Worsenned in Januaray 2021. pt daughter had covid and pt had to quarantine for 24 days. All covid tests were negative. Has not sleep for longer than 1 hour at a time in the last 3 weeks. Pt was taking xanax for sleep and menopause a while ago but uis no longer taking this.    F/u  1. Insomnia worse x 3 weeks sleeping only 1 hr and up grouting her bathroom and doing tasks. She wakes up at 5 am ready to go and gets in bed at 10 pm and adrenaline is running. She was on [place] in 2012 due to husband and father died. She does not wish to try addictive med for sleep. Had been Rx xanax 0.5 mg qd prn but not taking this currently   She does not think she is under stress. She does have 1 daughter living with her with 2 y.o and 7.yo grandkids and another daughter graduating from [employer] but does not feel stress out  She has the lights out and TV off   She tried melatonin at night but gave her more energy and felt like was about to hallucinate   She has been seeing EAP 1x per month for these issues no FH bipolar/schizophrenia   PHQ 9 score 12 and GAD 7 score 17 today though denies anxiety/depression.  She is exercising 3x per week x 1 hour and lost 30 lbs w/in the last year tryin g  2. BP elevated today w/o sx's f/u with PCP  Review of Systems  Respiratory: Negative for shortness of breath.   Cardiovascular: Negative for chest pain.  Neurological: Negative for headaches.  Psychiatric/Behavioral: Negative for depression. The patient has insomnia. The patient is not nervous/anxious.    Past Medical History:  Diagnosis Date  . Dental bridge present    No bridge.  Implant - top left  . Esophageal stricture    dilated 2009,  [facility]  . GERD (gastroesophageal reflux disease)   . History of hiatal hernia   . Hypertension    no prior treatment  . Insomnia    chronic, no prior sleep  study  . Left bundle branch block   . PONV (postoperative nausea and vomiting)   . rhinitis allergic   . Trigeminal neuralgia   . Wears contact lenses   . Wears hearing aid    bilateral   Past Surgical History:  Procedure Laterality Date  . ABDOMINAL HYSTERECTOMY  2000   no history of ca   . CHOLECYSTECTOMY  2008  . COLONOSCOPY WITH PROPOFOL N/A 05/11/2019   Procedure: COLONOSCOPY WITH PROPOFOL;  Surgeon: Lucilla Lame, MD;  Location: [facility];  Service: Endoscopy;  Laterality: N/A;  . ESOPHAGEAL DILATION  05/11/2019   Procedure: ESOPHAGEAL DILATION;  Surgeon: Lucilla Lame, MD;  Location: [facility];  Service: Endoscopy;;  . ESOPHAGOGASTRODUODENOSCOPY (EGD) WITH PROPOFOL N/A 05/12/2016   Procedure: ESOPHAGOGASTRODUODENOSCOPY (EGD) WITH PROPOFOL;  Surgeon: Jonathon Bellows, MD;  Location: [facility];  Service: Endoscopy;  Laterality: N/A;  . ESOPHAGOGASTRODUODENOSCOPY (EGD) WITH PROPOFOL N/A 05/11/2019   Procedure: ESOPHAGOGASTRODUODENOSCOPY (EGD) WITH PROPOFOL;  Surgeon: Lucilla Lame, MD;  Location: [facility];  Service: Endoscopy;  Laterality: N/A;  Diabetic - oral meds  . POLYPECTOMY  05/11/2019   Procedure: POLYPECTOMY;  Surgeon: Lucilla Lame, MD;  Location: [facility];  Service: Endoscopy;;   Family  History  Problem Relation Age of Onset  . Cancer Father        Bladder,  in Hospice  . Diabetes type II Father   . COPD Father   . Coronary artery disease Father   . Heart disease Father 38  . Diabetes Mother   . Hyperlipidemia Mother   . Hypertension Mother   . Early death Paternal Grandmother   . Heart disease Paternal Grandmother        CAD  . Prostate cancer Paternal Grandfather    Social History   Socioeconomic History  . Marital status: Single    Spouse name: Ajanique Beckstrand  . Number of children: 3  . Years of education: 38  . Highest education level: Not on file  Occupational History  . Occupation: Collections  Tobacco Use   . Smoking status: Never Smoker  . Smokeless tobacco: Never Used  Substance and Sexual Activity  . Alcohol use: No  . Drug use: No  . Sexual activity: Not Currently  Other Topics Concern  . Not on file  Social History Narrative   Lives at home with 1 daughter and 2 grandkids    2 daughters    Widowed    Works [employer] homes    Right-handed.   4 cups caffeine per day.   Social Determinants of Health   Financial Resource Strain:   . Difficulty of Paying Living Expenses:   Food Insecurity:   . Worried About Charity fundraiser in the Last Year:   . Arboriculturist in the Last Year:   Transportation Needs:   . Film/video editor (Medical):   Marland Kitchen Lack of Transportation (Non-Medical):   Physical Activity:   . Days of Exercise per Week:   . Minutes of Exercise per Session:   Stress:   . Feeling of Stress :   Social Connections:   . Frequency of Communication with Friends and Family:   . Frequency of Social Gatherings with Friends and Family:   . Attends Religious Services:   . Active Member of Clubs or Organizations:   . Attends Archivist Meetings:   Marland Kitchen Marital Status:   Intimate Partner Violence:   . Fear of Current or Ex-Partner:   . Emotionally Abused:   Marland Kitchen Physically Abused:   . Sexually Abused:    Current Meds  Medication Sig  . acetaminophen (TYLENOL) 500 MG tablet Take 1,000 mg by mouth every 6 (six) hours as needed for mild pain.  Marland Kitchen betamethasone valerate (VALISONE) 0.1 % cream Apply topically 2 (two) times daily.  . cyanocobalamin (,VITAMIN B-12,) 1000 MCG/ML injection INJECT 1000 MCG IM WEEKLY X 3, THEN MONTHLY THEREAFTER  . metFORMIN (GLUCOPHAGE) 500 MG tablet TAKE 1 TABLET (500 MG TOTAL) BY MOUTH 2 (TWO) TIMES DAILY WITH A MEAL.  . pantoprazole (PROTONIX) 40 MG tablet TAKE 1 TABLET (40 MG TOTAL) BY MOUTH DAILY  . Syringe/Needle, Disp, (SYRINGE 3CC/25GX1") 25G X 1" 3 ML MISC Use for b12 injections   Allergies  Allergen Reactions  . Promethazine  Anaphylaxis  . Benadryl [Diphenhydramine Hcl] Hives  . Phenothiazines Other (See Comments)   No results found for this or any previous visit (from the past 2160 hour(s)). Objective  Body mass index is 30.17 kg/m. Wt Readings from Last 3 Encounters:  10/09/19 192 lb 9.6 oz (87.4 kg)  05/11/19 187 lb (84.8 kg)  03/12/19 193 lb 12.8 oz (87.9 kg)   Temp Readings from Last 3 Encounters:  10/09/19 Marland Kitchen)  97.2 F (36.2 C) (Temporal)  05/11/19 (!) 97.2 F (36.2 C)  03/12/19 97.7 F (36.5 C) (Temporal)   BP Readings from Last 3 Encounters:  10/09/19 (!) 150/90  05/11/19 (!) 122/91  03/12/19 (!) 138/92   Pulse Readings from Last 3 Encounters:  10/09/19 85  05/11/19 72  03/12/19 (!) 101    Physical Exam Vitals and nursing note reviewed.  Constitutional:      Appearance: Normal appearance. She is well-developed and well-groomed.  HENT:     Head: Normocephalic and atraumatic.  Eyes:     Conjunctiva/sclera: Conjunctivae normal.     Pupils: Pupils are equal, round, and reactive to light.  Cardiovascular:     Rate and Rhythm: Normal rate and regular rhythm.     Heart sounds: Normal heart sounds. No murmur.  Pulmonary:     Effort: Pulmonary effort is normal.     Breath sounds: Normal breath sounds.  Skin:    General: Skin is warm and dry.  Neurological:     General: No focal deficit present.     Mental Status: She is alert and oriented to person, place, and time. Mental status is at baseline.     Gait: Gait normal.  Psychiatric:        Attention and Perception: Attention and perception normal.        Mood and Affect: Mood and affect normal.        Speech: Speech normal.        Behavior: Behavior normal. Behavior is cooperative.        Thought Content: Thought content normal.        Cognition and Memory: Cognition and memory normal.        Judgment: Judgment normal.     Assessment  Plan  Anxiety - Plan: ALPRAZolam (XANAX) 0.25 MG tablet, hydrOXYzine (ATARAX/VISTARIL)  25-50 MG tablet  Anxiety and depression - Plan: ALPRAZolam (XANAX) 0.25 MG tablet qd prn, traZODone (DESYREL) 50 MG tablet  Insomnia, unspecified type - Plan: hydrOXYzine (ATARAX/VISTARIL) 25 MG tablet, traZODone (DESYREL) 50 MG tablet  Consider psych disc with pt today in future to f/u other causes of insomnia in future I.e bipolar, etc pt agreeable will my chart in 1-2 weeks how she is doing  F/u EAP therapy 1x per month   Elevated blood pressure reading  F/u with PCP may need medication to tx BP elevated >2 visits   Provider: Dr. Olivia Mackie McLean-Scocuzza-[facility]

## 2019-10-11 ENCOUNTER — Other Ambulatory Visit: Payer: Managed Care, Other (non HMO)

## 2019-10-12 ENCOUNTER — Other Ambulatory Visit: Payer: Self-pay

## 2019-10-12 ENCOUNTER — Ambulatory Visit: Payer: Managed Care, Other (non HMO) | Attending: Internal Medicine

## 2019-10-12 DIAGNOSIS — Z23 Encounter for immunization: Secondary | ICD-10-CM

## 2019-10-12 NOTE — Progress Notes (Signed)
   Covid-19 Vaccination Clinic  Name:  Mary Bryant    MRN: [id number] DOB: 1965-12-29  10/12/2019  Mary Bryant was observed post Covid-19 immunization for 15 minutes without incident. She was provided with Vaccine Information Sheet and instruction to access the V-Safe system.   Mary Bryant was instructed to call 911 with any severe reactions post vaccine: Marland Kitchen Difficulty breathing  . Swelling of face and throat  . A fast heartbeat  . A bad rash all over body  . Dizziness and weakness   Immunizations Administered    Name Date Dose VIS Date Route   Pfizer COVID-19 Vaccine 10/12/2019  8:12 AM 0.3 mL 06/29/2019 Intramuscular   Manufacturer: [place]   Lot: [id number]   [place]: [id number]

## 2019-10-31 ENCOUNTER — Other Ambulatory Visit: Payer: Self-pay | Admitting: Internal Medicine

## 2019-10-31 DIAGNOSIS — F419 Anxiety disorder, unspecified: Secondary | ICD-10-CM

## 2019-10-31 DIAGNOSIS — G4700 Insomnia, unspecified: Secondary | ICD-10-CM

## 2019-10-31 DIAGNOSIS — F329 Major depressive disorder, single episode, unspecified: Secondary | ICD-10-CM

## 2019-11-07 ENCOUNTER — Ambulatory Visit: Payer: Managed Care, Other (non HMO) | Attending: Internal Medicine

## 2019-11-07 DIAGNOSIS — Z23 Encounter for immunization: Secondary | ICD-10-CM

## 2019-11-07 NOTE — Progress Notes (Signed)
   Covid-19 Vaccination Clinic  Name:  Mary Bryant    MRN: [id number] DOB: Jan 01, 1966  11/07/2019  [name] was observed post Covid-19 immunization for 15 minutes without incident. She was provided with Vaccine Information Sheet and instruction to access the V-Safe system.   [name] was instructed to call 911 with any severe reactions post vaccine: Marland Kitchen Difficulty breathing  . Swelling of face and throat  . A fast heartbeat  . A bad rash all over body  . Dizziness and weakness   Immunizations Administered    Name Date Dose VIS Date Route   Pfizer COVID-19 Vaccine 11/07/2019  8:09 AM 0.3 mL 09/12/2018 Intramuscular   Manufacturer: [place]   Lot: [id number]   [place]: [id number]

## 2019-11-20 ENCOUNTER — Ambulatory Visit: Payer: Managed Care, Other (non HMO) | Admitting: Internal Medicine

## 2019-11-30 ENCOUNTER — Other Ambulatory Visit: Payer: Self-pay

## 2019-12-04 ENCOUNTER — Encounter: Payer: Self-pay | Admitting: Internal Medicine

## 2019-12-04 ENCOUNTER — Other Ambulatory Visit: Payer: Self-pay

## 2019-12-04 ENCOUNTER — Ambulatory Visit: Payer: Managed Care, Other (non HMO) | Admitting: Internal Medicine

## 2019-12-04 DIAGNOSIS — R519 Headache, unspecified: Secondary | ICD-10-CM | POA: Diagnosis not present

## 2019-12-04 DIAGNOSIS — G8929 Other chronic pain: Secondary | ICD-10-CM | POA: Diagnosis not present

## 2019-12-04 DIAGNOSIS — F419 Anxiety disorder, unspecified: Secondary | ICD-10-CM

## 2019-12-04 DIAGNOSIS — I10 Essential (primary) hypertension: Secondary | ICD-10-CM | POA: Diagnosis not present

## 2019-12-04 MED ORDER — METOPROLOL SUCCINATE ER 50 MG PO TB24: 50.0000 mg | ORAL_TABLET | Freq: Every day | ORAL | 3 refills | Status: DC

## 2019-12-04 NOTE — Patient Instructions (Addendum)
starting you on metoprolol XL 50 mg at bedtime  Use the hydroxyzine during the day for mild anxiety  Use the trazodone for insomnia , add hydroxyzine after 30 minutes if needed  Save the xanax for 3 a m wakeup  Or panic attacks/chest pain   YOU NEED 30 MINUTES  OF TIME OUT DAILY!  YOU ALSO NEED 30 MINUTES OF EXERCISE DAILY !!

## 2019-12-04 NOTE — Assessment & Plan Note (Signed)
Metoprolol xl prescribed for nighttime use.

## 2019-12-04 NOTE — Assessment & Plan Note (Addendum)
Reviewed recent medication changes.  Advised her to start trazodone qhs for insomnia,  Followed by hydroxyzine for persistent insomnia and anxiety . Advised to use alprazolam as a last resort , for 3 am wakeups.  counselled about drawing boundaries with family and requiring family to respect her needs

## 2019-12-04 NOTE — Progress Notes (Signed)
Subjective:  Patient ID: Mary Bryant, female    DOB: [birth year]  Age: 54 y.o. MRN: [id number]  CC: Diagnoses of Elevated blood pressure reading with diagnosis of hypertension, Anxiety, and Chronic nonintractable headache, unspecified headache type were pertinent to this visit.  HPI Mary Bryant presents for follow up on anxiety AND new onset hypertension.  This visit occurred during the SARS-CoV-2 public health emergency.  Safety protocols were in place, including screening questions prior to the visit, additional usage of staff PPE, and extensive cleaning of exam room while observing appropriate contact time as indicated for disinfecting solutions.    Patient has received both doses of the available COVID 19 vaccine without complications.  Patient continues to mask when outside of the home except when walking in yard or at safe distances from others .  Patient denies any change in mood or development of unhealthy behaviors resuting from the pandemic's restriction of activities and socialization.    Patient was treated in [facility] ER recently for chest pain and ruled out for AMI .  She was referred to cardiology and advised to address her anxiety with her PCP   For the last week or two she has been having pressure behind the eyes and elevated blood pressure readings.  She has been under an enormous amount of stress balancing a full time job and supporting her daughter and grandchildren.  She has not had a vacation in 5 years, but is planning a week off in June for a beach trip.   Has been taking ibuprofen prn headaches.  Last dose last evening 400  mg   Outpatient Medications Prior to Visit  Medication Sig Dispense Refill  . acetaminophen (TYLENOL) 500 MG tablet Take 1,000 mg by mouth every 6 (six) hours as needed for mild pain.    Marland Kitchen ALPRAZolam (XANAX) 0.25 MG tablet Take 1 tablet (0.25 mg total) by mouth daily as needed for anxiety. 30 tablet 0  . betamethasone valerate  (VALISONE) 0.1 % cream Apply topically 2 (two) times daily. 30 g 0  . cyanocobalamin (,VITAMIN B-12,) 1000 MCG/ML injection INJECT 1000 MCG IM WEEKLY X 3, THEN MONTHLY THEREAFTER 10 mL 1  . metFORMIN (GLUCOPHAGE) 500 MG tablet TAKE 1 TABLET (500 MG TOTAL) BY MOUTH 2 (TWO) TIMES DAILY WITH A MEAL. 180 tablet 3  . pantoprazole (PROTONIX) 40 MG tablet TAKE 1 TABLET (40 MG TOTAL) BY MOUTH DAILY 90 tablet 3  . SODIUM FLUORIDE 5000 SENSITIVE 1.1-5 % PSTE BRUSH TWICE A DAY IN PLACE OF REGULAR TOOTHPASTE    . Syringe/Needle, Disp, (SYRINGE 3CC/25GX1") 25G X 1" 3 ML MISC Use for b12 injections 50 each 0  . hydrOXYzine (ATARAX/VISTARIL) 25 MG tablet TAKE 1-2 TABLETS (25-50 MG TOTAL) BY MOUTH AT BEDTIME AS NEEDED. (Patient not taking: Reported on 12/04/2019) 180 tablet 1  . traZODone (DESYREL) 50 MG tablet TAKE 0.5-1 TABLETS (25-50 MG TOTAL) BY MOUTH AT BEDTIME AS NEEDED FOR SLEEP. CAN INCREASE TO 100 MG STARTING WEEK 2 (Patient not taking: Reported on 12/04/2019) 180 tablet 1   Facility-Administered Medications Prior to Visit  Medication Dose Route Frequency Provider Last Rate Last Admin  . gadopentetate dimeglumine (MAGNEVIST) injection 20 mL  20 mL Intravenous Once PRN Marcial Pacas, MD        Review of Systems;  Patient denies headache, fevers, malaise, unintentional weight loss, skin rash, eye pain, sinus congestion and sinus pain, sore throat, dysphagia,  hemoptysis , cough, dyspnea, wheezing, chest pain, palpitations, orthopnea, edema,  abdominal pain, nausea, melena, diarrhea, constipation, flank pain, dysuria, hematuria, urinary  Frequency, nocturia, numbness, tingling, seizures,  Focal weakness, Loss of consciousness,  Tremor, insomnia, depression, anxiety, and suicidal ideation.      Objective:  BP (!) 150/104 (BP Location: Left Arm, Patient Position: Sitting, Cuff Size: Normal)   Pulse (!) 103   Temp (!) 97.3 F (36.3 C) (Temporal)   Resp 15   Ht 5\' 7"  (1.702 m)   Wt 194 lb (88 kg)   SpO2 98%    BMI 30.38 kg/m   BP Readings from Last 3 Encounters:  12/04/19 (!) 150/104  10/09/19 (!) 150/90  05/11/19 (!) 122/91    Wt Readings from Last 3 Encounters:  12/04/19 194 lb (88 kg)  10/09/19 192 lb 9.6 oz (87.4 kg)  05/11/19 187 lb (84.8 kg)    General appearance: alert, cooperative and appears stated age Ears: normal TM's and external ear canals both ears Throat: lips, mucosa, and tongue normal; teeth and gums normal Neck: no adenopathy, no carotid bruit, supple, symmetrical, trachea midline and thyroid not enlarged, symmetric, no tenderness/mass/nodules Back: symmetric, no curvature. ROM normal. No CVA tenderness. Lungs: clear to auscultation bilaterally Heart: regular rate and rhythm, S1, S2 normal, no murmur, click, rub or gallop Abdomen: soft, non-tender; bowel sounds normal; no masses,  no organomegaly Pulses: 2+ and symmetric Skin: Skin color, texture, turgor normal. No rashes or lesions Lymph nodes: Cervical, supraclavicular, and axillary nodes normal.  Lab Results  Component Value Date   HGBA1C 6.2 02/26/2019   HGBA1C 6.2 10/04/2018   HGBA1C 6.1 05/05/2018    Lab Results  Component Value Date   CREATININE 0.95 02/26/2019   CREATININE 0.94 10/04/2018   CREATININE 0.99 05/05/2018    Lab Results  Component Value Date   WBC 9.5 02/26/2019   HGB 12.7 02/26/2019   HCT 39.4 02/26/2019   PLT 358.0 02/26/2019   GLUCOSE 93 02/26/2019   CHOL 178 10/04/2018   TRIG 73.0 10/04/2018   HDL 60.00 10/04/2018   LDLCALC 104 (H) 10/04/2018   ALT 14 02/26/2019   AST 16 02/26/2019   NA 137 02/26/2019   K 3.9 02/26/2019   CL 103 02/26/2019   CREATININE 0.95 02/26/2019   BUN 12 02/26/2019   CO2 26 02/26/2019   TSH 1.72 02/26/2019   HGBA1C 6.2 02/26/2019   MICROALBUR 1.4 10/04/2018    No results found.  Assessment & Plan:   Problem List Items Addressed This Visit      Unprioritized   Anxiety    Reviewed recent medication changes.  Advised her to start  trazodone qhs for insomnia,  Followed by hydroxyzine for persistent insomnia and anxiety . Advised to use alprazolam as a last resort , for 3 am wakeups.  counselled about drawing boundaries with family and requiring family to respect her needs       Chronic headaches    Metoprolol xl prescribed for nighttime use.       Relevant Medications   metoprolol succinate (TOPROL-XL) 50 MG 24 hr tablet   Elevated blood pressure reading with diagnosis of hypertension    Starting Toprol XL 50 mg daily.  Asked to monitor BP at home and if not at goal after one week will make adjustments to medications       Relevant Medications   metoprolol succinate (TOPROL-XL) 50 MG 24 hr tablet      I am having Lorell S. Giovannetti start on metoprolol succinate. I am also having her maintain  her acetaminophen, SYRINGE 3CC/25GX1", cyanocobalamin, metFORMIN, betamethasone valerate, pantoprazole, ALPRAZolam, hydrOXYzine, traZODone, and Sodium Fluoride 5000 Sensitive.  Meds ordered this encounter  Medications  . metoprolol succinate (TOPROL-XL) 50 MG 24 hr tablet    Sig: Take 1 tablet (50 mg total) by mouth daily. Take with or immediately following a meal.    Dispense:  90 tablet    Refill:  3    There are no discontinued medications.  Follow-up: Return in about 4 weeks (around 01/01/2020).   Crecencio Mc, MD

## 2019-12-04 NOTE — Assessment & Plan Note (Signed)
Starting Toprol XL 50 mg daily.  Asked to monitor BP at home and if not at goal after one week will make adjustments to medications

## 2020-01-01 ENCOUNTER — Ambulatory Visit: Payer: Managed Care, Other (non HMO) | Admitting: Internal Medicine

## 2020-01-16 ENCOUNTER — Encounter: Payer: Self-pay | Admitting: Internal Medicine

## 2020-01-16 ENCOUNTER — Ambulatory Visit: Payer: Managed Care, Other (non HMO) | Admitting: Internal Medicine

## 2020-01-16 ENCOUNTER — Other Ambulatory Visit: Payer: Self-pay

## 2020-01-16 VITALS — BP 128/86 | HR 90 | Temp 98.5°F | Resp 15 | Ht 67.0 in | Wt 191.2 lb

## 2020-01-16 DIAGNOSIS — E663 Overweight: Secondary | ICD-10-CM

## 2020-01-16 DIAGNOSIS — R519 Headache, unspecified: Secondary | ICD-10-CM

## 2020-01-16 DIAGNOSIS — E119 Type 2 diabetes mellitus without complications: Secondary | ICD-10-CM

## 2020-01-16 DIAGNOSIS — G8929 Other chronic pain: Secondary | ICD-10-CM

## 2020-01-16 DIAGNOSIS — I10 Essential (primary) hypertension: Secondary | ICD-10-CM

## 2020-01-16 NOTE — Progress Notes (Signed)
Subjective:  Patient ID: Mary Bryant, female    DOB: 1965/10/19  Age: 54 y.o. MRN: [id number]  CC: The primary encounter diagnosis was Diabetes mellitus without complication ([place]). Diagnoses of Overweight (BMI 25.0-29.9), Essential hypertension, and Chronic nonintractable headache, unspecified headache type were also pertinent to this visit.  HPI Mary Bryant presents for follow up on hypertension and headaches  .This visit occurred during the SARS-CoV-2 public health emergency.  Safety protocols were in place, including screening questions prior to the visit, additional usage of staff PPE, and extensive cleaning of exam room while observing appropriate contact time as indicated for disinfecting solutions.   Seen 6 weeks ago and metoprolol started.   She feels much better.  Headaches gone.  BP < 130/80  Obesity: exercising and following a low glycemic index diet .   she has set a weight loss goal  Of 30 lbs by December,  Goal 160 lbs.   All women in family are participating in weight loss program ,  All very motivated.    Cooking weekly meals for mother Mary Bryant who was hospitalized and received PCI/stent x 2  Outpatient Medications Prior to Visit  Medication Sig Dispense Refill  . acetaminophen (TYLENOL) 500 MG tablet Take 1,000 mg by mouth every 6 (six) hours as needed for mild pain.    Marland Kitchen ALPRAZolam (XANAX) 0.25 MG tablet Take 1 tablet (0.25 mg total) by mouth daily as needed for anxiety. 30 tablet 0  . betamethasone valerate (VALISONE) 0.1 % cream Apply topically 2 (two) times daily. 30 g 0  . cyanocobalamin (,VITAMIN B-12,) 1000 MCG/ML injection INJECT 1000 MCG IM WEEKLY X 3, THEN MONTHLY THEREAFTER 10 mL 1  . hydrOXYzine (ATARAX/VISTARIL) 25 MG tablet TAKE 1-2 TABLETS (25-50 MG TOTAL) BY MOUTH AT BEDTIME AS NEEDED. 180 tablet 1  . metFORMIN (GLUCOPHAGE) 500 MG tablet TAKE 1 TABLET (500 MG TOTAL) BY MOUTH 2 (TWO) TIMES DAILY WITH A MEAL. 180 tablet 3  . metoprolol  succinate (TOPROL-XL) 50 MG 24 hr tablet Take 1 tablet (50 mg total) by mouth daily. Take with or immediately following a meal. 90 tablet 3  . pantoprazole (PROTONIX) 40 MG tablet TAKE 1 TABLET (40 MG TOTAL) BY MOUTH DAILY 90 tablet 3  . SODIUM FLUORIDE 5000 SENSITIVE 1.1-5 % PSTE BRUSH TWICE A DAY IN PLACE OF REGULAR TOOTHPASTE    . Syringe/Needle, Disp, (SYRINGE 3CC/25GX1") 25G X 1" 3 ML MISC Use for b12 injections 50 each 0  . traZODone (DESYREL) 50 MG tablet TAKE 0.5-1 TABLETS (25-50 MG TOTAL) BY MOUTH AT BEDTIME AS NEEDED FOR SLEEP. CAN INCREASE TO 100 MG STARTING WEEK 2 180 tablet 1   Facility-Administered Medications Prior to Visit  Medication Dose Route Frequency Provider Last Rate Last Admin  . gadopentetate dimeglumine (MAGNEVIST) injection 20 mL  20 mL Intravenous Once PRN Marcial Pacas, MD        Review of Systems;  Patient denies headache, fevers, malaise, unintentional weight loss, skin rash, eye pain, sinus congestion and sinus pain, sore throat, dysphagia,  hemoptysis , cough, dyspnea, wheezing, chest pain, palpitations, orthopnea, edema, abdominal pain, nausea, melena, diarrhea, constipation, flank pain, dysuria, hematuria, urinary  Frequency, nocturia, numbness, tingling, seizures,  Focal weakness, Loss of consciousness,  Tremor, insomnia, depression, anxiety, and suicidal ideation.      Objective:  BP 128/86 (BP Location: Left Arm, Patient Position: Sitting, Cuff Size: Normal)   Pulse 90   Temp 98.5 F (36.9 C) (Temporal)   Resp 15  Ht 5\' 7"  (1.702 m)   Wt 191 lb 3.2 oz (86.7 kg)   SpO2 97%   BMI 29.95 kg/m   BP Readings from Last 3 Encounters:  01/16/20 128/86  12/04/19 (!) 150/104  10/09/19 (!) 150/90    Wt Readings from Last 3 Encounters:  01/16/20 191 lb 3.2 oz (86.7 kg)  12/04/19 194 lb (88 kg)  10/09/19 192 lb 9.6 oz (87.4 kg)    General appearance: alert, cooperative and appears stated age Ears: normal TM's and external ear canals both ears Throat:  lips, mucosa, and tongue normal; teeth and gums normal Neck: no adenopathy, no carotid bruit, supple, symmetrical, trachea midline and thyroid not enlarged, symmetric, no tenderness/mass/nodules Back: symmetric, no curvature. ROM normal. No CVA tenderness. Lungs: clear to auscultation bilaterally Heart: regular rate and rhythm, S1, S2 normal, no murmur, click, rub or gallop Abdomen: soft, non-tender; bowel sounds normal; no masses,  no organomegaly Pulses: 2+ and symmetric Skin: Skin color, texture, turgor normal. No rashes or lesions Lymph nodes: Cervical, supraclavicular, and axillary nodes normal.  Lab Results  Component Value Date   HGBA1C 6.2 02/26/2019   HGBA1C 6.2 10/04/2018   HGBA1C 6.1 05/05/2018    Lab Results  Component Value Date   CREATININE 0.95 02/26/2019   CREATININE 0.94 10/04/2018   CREATININE 0.99 05/05/2018    Lab Results  Component Value Date   WBC 9.5 02/26/2019   HGB 12.7 02/26/2019   HCT 39.4 02/26/2019   PLT 358.0 02/26/2019   GLUCOSE 93 02/26/2019   CHOL 178 10/04/2018   TRIG 73.0 10/04/2018   HDL 60.00 10/04/2018   LDLCALC 104 (H) 10/04/2018   ALT 14 02/26/2019   AST 16 02/26/2019   NA 137 02/26/2019   K 3.9 02/26/2019   CL 103 02/26/2019   CREATININE 0.95 02/26/2019   BUN 12 02/26/2019   CO2 26 02/26/2019   TSH 1.72 02/26/2019   HGBA1C 6.2 02/26/2019   MICROALBUR 1.4 10/04/2018    No results found.  Assessment & Plan:   Problem List Items Addressed This Visit      Unprioritized   Essential hypertension    Well controlled on current regimen of metoprolol.  . Renal function stable, no changes today.      Overweight (BMI 25.0-29.9)    I have  encouraged  Continued weight loss with goal of 10% of body weigh over the next 6 months using a low glycemic index diet and regular exercise a minimum of 5 days per week.        Chronic headaches    improved with daily use of metoprolol .  No changes tonight      Diabetes mellitus  without complication ([place]) - Primary    she  lowered her A1c to 6.2 with Weight loss and metformin.  Encouraged to continue to follow a low glycemic index diet and metformin .  Lab Results  Component Value Date   HGBA1C 6.2 02/26/2019   Lab Results  Component Value Date   MICROALBUR 1.4 10/04/2018         Relevant Orders   Hemoglobin A1c   Comprehensive metabolic panel   Microalbumin / creatinine urine ratio   Lipid panel     A total of 22 minutes of face to face time was spent with patient more than half of which was spent in counselling about the above mentioned conditions  and coordination of care   I am having Pierce S. Jiles maintain her acetaminophen, SYRINGE  3CC/25GX1", cyanocobalamin, metFORMIN, betamethasone valerate, pantoprazole, ALPRAZolam, hydrOXYzine, traZODone, Sodium Fluoride 5000 Sensitive, and metoprolol succinate.  No orders of the defined types were placed in this encounter.   There are no discontinued medications.  Follow-up: No follow-ups on file.   Crecencio Mc, MD

## 2020-01-19 NOTE — Assessment & Plan Note (Signed)
I have encouraged  Continued weight loss with goal of 10% of body weigh over the next 6 months using a low glycemic index diet and regular exercise a minimum of 5 days per week.  ? ?

## 2020-01-19 NOTE — Assessment & Plan Note (Signed)
she  lowered her A1c to 6.2 with Weight loss and metformin.  Encouraged to continue to follow a low glycemic index diet and metformin .  Lab Results  Component Value Date   HGBA1C 6.2 02/26/2019   Lab Results  Component Value Date   MICROALBUR 1.4 10/04/2018

## 2020-01-19 NOTE — Assessment & Plan Note (Signed)
improved with daily use of metoprolol .  No changes tonight

## 2020-01-19 NOTE — Assessment & Plan Note (Signed)
Well controlled on current regimen of metoprolol.  . Renal function stable, no changes today. 

## 2020-01-20 ENCOUNTER — Other Ambulatory Visit: Payer: Self-pay | Admitting: Internal Medicine

## 2020-02-12 ENCOUNTER — Other Ambulatory Visit: Payer: Self-pay | Admitting: Internal Medicine

## 2020-02-12 DIAGNOSIS — R5383 Other fatigue: Secondary | ICD-10-CM

## 2020-02-12 DIAGNOSIS — F411 Generalized anxiety disorder: Secondary | ICD-10-CM

## 2020-02-18 LAB — HM DIABETES EYE EXAM

## 2020-04-01 ENCOUNTER — Ambulatory Visit: Payer: 59 | Admitting: Psychology

## 2020-04-15 ENCOUNTER — Other Ambulatory Visit: Payer: Self-pay

## 2020-04-15 ENCOUNTER — Other Ambulatory Visit: Payer: Self-pay | Admitting: Internal Medicine

## 2020-04-15 DIAGNOSIS — F419 Anxiety disorder, unspecified: Secondary | ICD-10-CM

## 2020-04-15 DIAGNOSIS — F32A Depression, unspecified: Secondary | ICD-10-CM

## 2020-04-16 ENCOUNTER — Other Ambulatory Visit: Payer: Self-pay

## 2020-04-16 DIAGNOSIS — F419 Anxiety disorder, unspecified: Secondary | ICD-10-CM

## 2020-04-16 DIAGNOSIS — G4700 Insomnia, unspecified: Secondary | ICD-10-CM

## 2020-04-16 MED ORDER — HYDROXYZINE HCL 25 MG PO TABS: 25.0000 mg | ORAL_TABLET | Freq: Every evening | ORAL | 1 refills | Status: DC | PRN

## 2020-04-16 MED ORDER — PANTOPRAZOLE SODIUM 40 MG PO TBEC: 40.0000 mg | DELAYED_RELEASE_TABLET | Freq: Every day | ORAL | 1 refills | Status: DC

## 2020-04-16 MED ORDER — METFORMIN HCL 500 MG PO TABS: 500.0000 mg | ORAL_TABLET | Freq: Two times a day (BID) | ORAL | 1 refills | Status: DC

## 2020-04-16 MED ORDER — ACYCLOVIR 400 MG PO TABS: 400.0000 mg | ORAL_TABLET | Freq: Every day | ORAL | 1 refills | Status: DC

## 2020-04-16 MED ORDER — METOPROLOL SUCCINATE ER 50 MG PO TB24: 50.0000 mg | ORAL_TABLET | Freq: Every day | ORAL | 1 refills | Status: DC

## 2020-04-16 MED ORDER — BETAMETHASONE VALERATE 0.1 % EX CREA: TOPICAL_CREAM | Freq: Two times a day (BID) | CUTANEOUS | 0 refills | Status: DC

## 2020-04-16 MED ORDER — ALPRAZOLAM 0.25 MG PO TABS: 0.2500 mg | ORAL_TABLET | Freq: Every day | ORAL | 0 refills | Status: DC | PRN

## 2020-07-17 ENCOUNTER — Ambulatory Visit: Payer: Managed Care, Other (non HMO) | Admitting: Internal Medicine

## 2020-08-26 ENCOUNTER — Telehealth: Payer: Self-pay

## 2020-08-26 NOTE — Telephone Encounter (Signed)
Spoken with Patient about her mychart she sent last night. Instructed her on her pcp policy on covid and also given her the number for the Resp clinic to be evaluated if she feels she needs to be seen. Also she stated if sx worsen she will go to ED.

## 2020-08-26 NOTE — Telephone Encounter (Signed)
Pt called to follow up on Mychart

## 2020-08-27 ENCOUNTER — Telehealth (INDEPENDENT_AMBULATORY_CARE_PROVIDER_SITE_OTHER): Payer: Self-pay | Admitting: Internal Medicine

## 2020-08-27 ENCOUNTER — Encounter: Payer: Self-pay | Admitting: Internal Medicine

## 2020-08-27 DIAGNOSIS — F419 Anxiety disorder, unspecified: Secondary | ICD-10-CM

## 2020-08-27 DIAGNOSIS — Z20822 Contact with and (suspected) exposure to covid-19: Secondary | ICD-10-CM

## 2020-08-27 DIAGNOSIS — F32A Depression, unspecified: Secondary | ICD-10-CM

## 2020-08-27 DIAGNOSIS — E119 Type 2 diabetes mellitus without complications: Secondary | ICD-10-CM

## 2020-08-27 MED ORDER — HYDROCOD POLST-CPM POLST ER 10-8 MG/5ML PO SUER: 5.0000 mL | Freq: Every evening | ORAL | 0 refills | Status: DC | PRN

## 2020-08-27 MED ORDER — ALPRAZOLAM 0.25 MG PO TABS
0.2500 mg | ORAL_TABLET | Freq: Every day | ORAL | 0 refills | Status: DC | PRN
Start: 2020-08-27 — End: 2021-04-24

## 2020-08-27 MED ORDER — PREDNISONE 10 MG PO TABS: ORAL_TABLET | ORAL | 0 refills | Status: DC

## 2020-08-27 MED ORDER — ONDANSETRON 4 MG PO TBDP: 4.0000 mg | ORAL_TABLET | Freq: Three times a day (TID) | ORAL | 0 refills | Status: DC | PRN

## 2020-08-27 NOTE — Telephone Encounter (Signed)
Pt called and states that she feels worse than she did. She states that she needs a call back ASAP. She thinks she took covid test too soon. Symptoms started on Sunday and she took test on Monday. There are no avail appts today at the time of call.

## 2020-08-27 NOTE — Patient Instructions (Signed)
I have prescribed  the zofran for the nausea and vomiting and sent it to pharmacy  Along with Tussionex for the cough and prednisone for the inflammation.  DO NOT COMBINE TUSSIONEX WITH ALPRAZOLAM  USE TYLENOL COLD AND SINUS TO PREVENT SINUSITIS FROM UNTREATED SINUS CONGESTION  HERE'S HOW YOU MANAGE COVID INFECTION :  VITAMIN C 1000 MG DAILY VITAMIN D 4000 IU DAILY  ZINC 50 MG DAILY   (stop these megadoses once you are feeling better)  Family member  needs to pick up gatorade, ginger ale,   and Chicken broth.  No sugar free drinks .   And once you  can keep those down , you  can add noodles and saltines and white meat chicken .   IF YOU DON'T USE THE TYLENOL COLD AND SINUS:   You can TAKE  up to 2000 mg of acetominophen (tylenol) every day safely  In divided doses (500 mg every 6 hours  Or 1000 mg every 12 hours.)  For your back pain   Use sudafed,  Sudafed PE or Afrin nasal spray for the congestion.   If your sinus congestion lasts more than 5 days , or if you develop facial pain or ear pain,   CALL FOR AN ANTIBIOTIC  DO NOT COMBINE PREDNISONE WITH ADVIL   OK TO TAKE TYLENOL WITH PREDNISONE:   YOU SHOULD BE FEVER FREE FOR 24 HOURS WITHOUT THE USE OF ADVIL OR MOTRIN ,  AND ALL SYMPTOMS NEED TO BE IMPROVING BEFORE YOU CAN BREAK QUARANTINE (MINIMUM OF 7 DAYS  FROM DAY 1 OF SYMPTOMS )

## 2020-08-27 NOTE — Progress Notes (Signed)
Virtual Visit via [place]  This visit type was conducted due to national recommendations for restrictions regarding the COVID-19 pandemic (e.g. social distancing).  This format is felt to be most appropriate for this patient at this time.  All issues noted in this document were discussed and addressed.  No physical exam was performed (except for noted visual exam findings with Video Visits).   I connected with@ on 08/27/20 at  4:00 PM EST by a video enabled telemedicine application and verified that I am speaking with the correct person using two identifiers. Location patient: home Location provider: work or home office Persons participating in the virtual visit: patient, provider  I discussed the limitations, risks, security and privacy concerns of performing an evaluation and management service by telephone and the availability of in person appointments. I also discussed with the patient that there may be a patient responsible charge related to this service. The patient expressed understanding and agreed to proceed.  Reason for visit: feeling ill   HPIw  55 yr old female previously vaccinated against COVID x 3 presents with symptoms starting on Saturday Feb 5 of sore throat, headache and nausea.  She developed a cough on Sunday, body aches,  and lost her sense of taste and smell .  She has developed shortness of breath with cough and has noted transient pulse oximetry desaturations to 88% with swift return to 95% or above after coughing has ended  She has had 2 positive COVID exposures and has had a negative PCR but is getting retested today.  She works with disabled adults.     ROS: See pertinent positives and negatives per HPI.  Past Medical History:  Diagnosis Date  . Dental bridge present    No bridge.  Implant - top left  . Esophageal stricture    dilated 2009,  [facility]  . GERD (gastroesophageal reflux disease)   . History of hiatal hernia   . Hypertension    no prior treatment   . Insomnia    chronic, no prior sleep study  . Left bundle branch block   . PONV (postoperative nausea and vomiting)   . rhinitis allergic   . Trigeminal neuralgia   . Wears contact lenses   . Wears hearing aid    bilateral    Past Surgical History:  Procedure Laterality Date  . ABDOMINAL HYSTERECTOMY  2000   no history of ca   . CHOLECYSTECTOMY  2008  . COLONOSCOPY WITH PROPOFOL N/A 05/11/2019   Procedure: COLONOSCOPY WITH PROPOFOL;  Surgeon: Lucilla Lame, MD;  Location: [facility];  Service: Endoscopy;  Laterality: N/A;  . ESOPHAGEAL DILATION  05/11/2019   Procedure: ESOPHAGEAL DILATION;  Surgeon: Lucilla Lame, MD;  Location: [facility];  Service: Endoscopy;;  . ESOPHAGOGASTRODUODENOSCOPY (EGD) WITH PROPOFOL N/A 05/12/2016   Procedure: ESOPHAGOGASTRODUODENOSCOPY (EGD) WITH PROPOFOL;  Surgeon: Jonathon Bellows, MD;  Location: [facility];  Service: Endoscopy;  Laterality: N/A;  . ESOPHAGOGASTRODUODENOSCOPY (EGD) WITH PROPOFOL N/A 05/11/2019   Procedure: ESOPHAGOGASTRODUODENOSCOPY (EGD) WITH PROPOFOL;  Surgeon: Lucilla Lame, MD;  Location: [facility];  Service: Endoscopy;  Laterality: N/A;  Diabetic - oral meds  . POLYPECTOMY  05/11/2019   Procedure: POLYPECTOMY;  Surgeon: Lucilla Lame, MD;  Location: [facility];  Service: Endoscopy;;    Family History  Problem Relation Age of Onset  . Cancer Father        Bladder,  in Hospice  . Diabetes type II Father   . COPD Father   .  Coronary artery disease Father   . Heart disease Father 22  . Diabetes Mother   . Hyperlipidemia Mother   . Hypertension Mother   . Early death Paternal Grandmother   . Heart disease Paternal Grandmother        CAD  . Prostate cancer Paternal Grandfather   . Graves' disease Daughter     SOCIAL HX:  reports that she has never smoked. She has never used smokeless tobacco. She reports that she does not drink alcohol and does not use drugs.  Current Outpatient  Medications:  .  acetaminophen (TYLENOL) 500 MG tablet, Take 1,000 mg by mouth every 6 (six) hours as needed for mild pain., Disp: , Rfl:  .  acyclovir (ZOVIRAX) 400 MG tablet, Take 1 tablet (400 mg total) by mouth 5 (five) times daily. For one week  As needed for blisters, Disp: 35 tablet, Rfl: 1 .  betamethasone valerate (VALISONE) 0.1 % cream, Apply topically 2 (two) times daily., Disp: 30 g, Rfl: 0 .  chlorpheniramine-HYDROcodone (TUSSIONEX PENNKINETIC ER) 10-8 MG/5ML SUER, Take 5 mLs by mouth at bedtime as needed., Disp: 140 mL, Rfl: 0 .  cyanocobalamin (,VITAMIN B-12,) 1000 MCG/ML injection, INJECT 1000 MCG IM WEEKLY X 3, THEN MONTHLY THEREAFTER, Disp: 10 mL, Rfl: 1 .  hydrOXYzine (ATARAX/VISTARIL) 25 MG tablet, Take 1-2 tablets (25-50 mg total) by mouth at bedtime as needed., Disp: 180 tablet, Rfl: 1 .  metFORMIN (GLUCOPHAGE) 500 MG tablet, Take 1 tablet (500 mg total) by mouth 2 (two) times daily with a meal., Disp: 180 tablet, Rfl: 1 .  metoprolol succinate (TOPROL-XL) 50 MG 24 hr tablet, Take 1 tablet (50 mg total) by mouth daily. Take with or immediately following a meal., Disp: 90 tablet, Rfl: 1 .  ondansetron (ZOFRAN ODT) 4 MG disintegrating tablet, Take 1 tablet (4 mg total) by mouth every 8 (eight) hours as needed for nausea or vomiting., Disp: 20 tablet, Rfl: 0 .  pantoprazole (PROTONIX) 40 MG tablet, Take 1 tablet (40 mg total) by mouth daily., Disp: 90 tablet, Rfl: 1 .  predniSONE (DELTASONE) 10 MG tablet, 6 tablets daily for 3 days, then reduce by 1 tablet daily until gone, Disp: 33 tablet, Rfl: 0 .  SODIUM FLUORIDE 5000 SENSITIVE 1.1-5 % PSTE, BRUSH TWICE A DAY IN PLACE OF REGULAR TOOTHPASTE, Disp: , Rfl:  .  Syringe/Needle, Disp, (SYRINGE 3CC/25GX1") 25G X 1" 3 ML MISC, Use for b12 injections, Disp: 50 each, Rfl: 0 .  traZODone (DESYREL) 50 MG tablet, TAKE 0.5-1 TABLETS (25-50 MG TOTAL) BY MOUTH AT BEDTIME AS NEEDED FOR SLEEP. CAN INCREASE TO 100 MG STARTING WEEK 2, Disp: 180  tablet, Rfl: 1 .  ALPRAZolam (XANAX) 0.25 MG tablet, Take 1 tablet (0.25 mg total) by mouth daily as needed for anxiety., Disp: 30 tablet, Rfl: 0 No current facility-administered medications for this visit.  Facility-Administered Medications Ordered in Other Visits:  .  gadopentetate dimeglumine (MAGNEVIST) injection 20 mL, 20 mL, Intravenous, Once PRN, Marcial Pacas, MD  EXAM:  VITALS per patient if applicable:  GENERAL: alert, oriented, appears well and in no acute distress  HEENT: atraumatic, conjunttiva clear, no obvious abnormalities on inspection of external nose and ears  NECK: normal movements of the head and neck  LUNGS: on inspection no signs of respiratory distress, breathing rate appears normal, no obvious gross SOB, gasping or wheezing  CV: no obvious cyanosis  MS: moves all visible extremities without noticeable abnormality  PSYCH/NEURO: pleasant and cooperative, no obvious depression or  anxiety, speech and thought processing grossly intact  ASSESSMENT AND PLAN:  Discussed the following assessment and plan:  Anxiety - Plan: ALPRAZolam (XANAX) 0.25 MG tablet  Anxiety and depression - Plan: ALPRAZolam (XANAX) 0.25 MG tablet  Diabetes mellitus without complication ([facility])  Suspected COVID-19 virus infection  Diabetes mellitus without complication ([place]) Managed with metformin   She is overdue for follow up labs since 2020 due to the epidemic   Suspected COVID-19 virus infection Treating COVID  symptoms with prednisone taper and cough suppressant.  Advised to remain in isolation until repeat testing rules out infection    I discussed the assessment and treatment plan with the patient. The patient was provided an opportunity to ask questions and all were answered. The patient agreed with the plan and demonstrated an understanding of the instructions.   The patient was advised to call back or seek an in-person evaluation if the symptoms worsen or if the condition fails  to improve as anticipated.   I spent 30 minutes dedicated to the care of this patient on the date of this encounter to include pre-visit review of his medical history,  Face-to-face time with the patient , and post visit ordering of testing and therapeutics.    Crecencio Mc, MD

## 2020-08-29 ENCOUNTER — Ambulatory Visit: Payer: Managed Care, Other (non HMO) | Admitting: Internal Medicine

## 2020-08-30 DIAGNOSIS — Z20822 Contact with and (suspected) exposure to covid-19: Secondary | ICD-10-CM | POA: Insufficient documentation

## 2020-08-30 NOTE — Assessment & Plan Note (Signed)
Treating COVID  symptoms with prednisone taper and cough suppressant.  Advised to remain in isolation until repeat testing rules out infection

## 2020-08-30 NOTE — Assessment & Plan Note (Addendum)
Managed with metformin   She is overdue for follow up labs since 2020 due to the epidemic

## 2020-09-02 ENCOUNTER — Telehealth: Payer: Self-pay

## 2020-09-02 DIAGNOSIS — Z1231 Encounter for screening mammogram for malignant neoplasm of breast: Secondary | ICD-10-CM

## 2020-09-02 NOTE — Telephone Encounter (Signed)
Mammogram has been ordered per pt request. Pt sent a message stating that she is due for one. Pt is aware that order has been placed and to call [facility] to schedule.

## 2020-12-02 ENCOUNTER — Other Ambulatory Visit: Payer: Self-pay

## 2020-12-02 MED ORDER — PANTOPRAZOLE SODIUM 40 MG PO TBEC: 40.0000 mg | DELAYED_RELEASE_TABLET | Freq: Every day | ORAL | 1 refills | Status: DC

## 2021-01-06 MED ORDER — SILVER SULFADIAZINE 1 % EX CREA: 1.0000 "application " | TOPICAL_CREAM | Freq: Every day | CUTANEOUS | 0 refills | Status: DC

## 2021-01-23 ENCOUNTER — Other Ambulatory Visit: Payer: Self-pay | Admitting: Internal Medicine

## 2021-01-26 DIAGNOSIS — D1801 Hemangioma of skin and subcutaneous tissue: Secondary | ICD-10-CM | POA: Diagnosis not present

## 2021-01-26 DIAGNOSIS — L821 Other seborrheic keratosis: Secondary | ICD-10-CM | POA: Diagnosis not present

## 2021-03-05 MED ORDER — SILVER SULFADIAZINE 1 % EX CREA: 1.0000 "application " | TOPICAL_CREAM | Freq: Every day | CUTANEOUS | 1 refills | Status: DC

## 2021-04-10 DIAGNOSIS — Z1231 Encounter for screening mammogram for malignant neoplasm of breast: Secondary | ICD-10-CM | POA: Diagnosis not present

## 2021-04-10 LAB — HM MAMMOGRAPHY

## 2021-04-24 ENCOUNTER — Other Ambulatory Visit: Payer: Self-pay

## 2021-04-24 ENCOUNTER — Ambulatory Visit (INDEPENDENT_AMBULATORY_CARE_PROVIDER_SITE_OTHER): Payer: BC Managed Care – PPO | Admitting: Internal Medicine

## 2021-04-24 ENCOUNTER — Encounter: Payer: Self-pay | Admitting: Internal Medicine

## 2021-04-24 VITALS — BP 132/96 | HR 82 | Temp 97.0°F | Ht 67.0 in | Wt 212.0 lb

## 2021-04-24 DIAGNOSIS — Z23 Encounter for immunization: Secondary | ICD-10-CM

## 2021-04-24 DIAGNOSIS — Z90710 Acquired absence of both cervix and uterus: Secondary | ICD-10-CM

## 2021-04-24 DIAGNOSIS — E669 Obesity, unspecified: Secondary | ICD-10-CM

## 2021-04-24 DIAGNOSIS — E1169 Type 2 diabetes mellitus with other specified complication: Secondary | ICD-10-CM | POA: Diagnosis not present

## 2021-04-24 DIAGNOSIS — F411 Generalized anxiety disorder: Secondary | ICD-10-CM

## 2021-04-24 DIAGNOSIS — E663 Overweight: Secondary | ICD-10-CM | POA: Diagnosis not present

## 2021-04-24 DIAGNOSIS — E1159 Type 2 diabetes mellitus with other circulatory complications: Secondary | ICD-10-CM | POA: Diagnosis not present

## 2021-04-24 DIAGNOSIS — F32A Depression, unspecified: Secondary | ICD-10-CM

## 2021-04-24 DIAGNOSIS — I152 Hypertension secondary to endocrine disorders: Secondary | ICD-10-CM

## 2021-04-24 DIAGNOSIS — F419 Anxiety disorder, unspecified: Secondary | ICD-10-CM

## 2021-04-24 DIAGNOSIS — F410 Panic disorder [episodic paroxysmal anxiety] without agoraphobia: Secondary | ICD-10-CM

## 2021-04-24 DIAGNOSIS — E119 Type 2 diabetes mellitus without complications: Secondary | ICD-10-CM

## 2021-04-24 LAB — LIPID PANEL
Cholesterol: 186 mg/dL (ref 0–200)
HDL: 61.3 mg/dL (ref >39.00)
LDL Cholesterol: 104 mg/dL — ABNORMAL HIGH (ref 0–99)
NonHDL: 124.76
Total CHOL/HDL Ratio: 3
Triglycerides: 106 mg/dL (ref 0.0–149.0)
VLDL: 21.2 mg/dL (ref 0.0–40.0)

## 2021-04-24 LAB — COMPREHENSIVE METABOLIC PANEL
ALT: 17 U/L (ref 0–35)
AST: 17 U/L (ref 0–37)
Albumin: 4.4 g/dL (ref 3.5–5.2)
Alkaline Phosphatase: 77 U/L (ref 39–117)
BUN: 8 mg/dL (ref 6–23)
CO2: 27 mEq/L (ref 19–32)
Calcium: 9.9 mg/dL (ref 8.4–10.5)
Chloride: 105 mEq/L (ref 96–112)
Creatinine, Ser: 0.92 mg/dL (ref 0.40–1.20)
GFR: 70.96 mL/min (ref >60.00)
Glucose, Bld: 96 mg/dL (ref 70–99)
Potassium: 4.4 mEq/L (ref 3.5–5.1)
Sodium: 141 mEq/L (ref 135–145)
Total Bilirubin: 0.5 mg/dL (ref 0.2–1.2)
Total Protein: 7.6 g/dL (ref 6.0–8.3)

## 2021-04-24 LAB — HEMOGLOBIN A1C: Hgb A1c MFr Bld: 6.5 % (ref 4.6–6.5)

## 2021-04-24 LAB — MICROALBUMIN / CREATININE URINE RATIO
Creatinine,U: 140.2 mg/dL
Microalb Creat Ratio: 0.5 mg/g (ref 0.0–30.0)
Microalb, Ur: <0.7 mg/dL (ref 0.0–1.9)

## 2021-04-24 MED ORDER — SERTRALINE HCL 50 MG PO TABS: 50.0000 mg | ORAL_TABLET | Freq: Every day | ORAL | 3 refills | Status: DC

## 2021-04-24 MED ORDER — HYDROXYZINE PAMOATE 25 MG PO CAPS: 25.0000 mg | ORAL_CAPSULE | Freq: Three times a day (TID) | ORAL | 0 refills | Status: DC | PRN

## 2021-04-24 MED ORDER — ALPRAZOLAM 0.25 MG PO TABS: 0.2500 mg | ORAL_TABLET | Freq: Every day | ORAL | 5 refills | Status: DC | PRN
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
## 2021-04-24 NOTE — Assessment & Plan Note (Addendum)
She has begun therapeutic counselling with a psychologist in [place] (he also has an office in [place])  Adding sertraline and hydroxyzine

## 2021-04-24 NOTE — Patient Instructions (Signed)
I have refilled the alprazolam to use FOR PANIC ATTACKS  USE HYDROXYZINE FOR ITCHING/ANXIETY , EVERY 8 HOURS IF NEEDED   Please start the  Sertraline (generic for Zoloft) at 1/2 tablet daily in the morning with breakfast for the first week to avoid nausea.  You can increase to a full tablet after 1 week if you havenot developed side effects of nausea.    You should start to Feel a difference after two weeks on the full dose .   Good news!  We now have 2 drugs for diabetes that are  injectable  and work by curbing  your appetite.   they are  called Ozempic and Mounjaro .  They  slows down your stomach emptying so you stay full longer .   We will discuss at your follow up in one month

## 2021-04-26 ENCOUNTER — Encounter: Payer: Self-pay | Admitting: Internal Medicine

## 2021-04-26 NOTE — Assessment & Plan Note (Addendum)
Historically well-controlled on diet alone .  hemoglobin A1c has been consistently at or  less than 7.0 . Patient is up-to-date on eye exams and foot exam is normal today. Patient has no microalbuminuria. Patient is not on statin by choice  . Discussed role of GLP agonist in managing appetite   Lab Results  Component Value Date   HGBA1C 6.5 04/24/2021   Lab Results  Component Value Date   MICROALBUR <0.7 04/24/2021   MICROALBUR 1.4 10/04/2018
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
## 2021-04-28 ENCOUNTER — Telehealth: Payer: Self-pay

## 2021-04-28 NOTE — Telephone Encounter (Signed)
LMTCB in regards to lab results.  

## 2021-05-04 MED ORDER — PANTOPRAZOLE SODIUM 40 MG PO TBEC
40.0000 mg | DELAYED_RELEASE_TABLET | Freq: Every day | ORAL | 1 refills | Status: DC
Start: 2021-05-04 — End: 2021-10-26

## 2021-05-04 MED ORDER — METOPROLOL SUCCINATE ER 50 MG PO TB24: 50.0000 mg | ORAL_TABLET | Freq: Every day | ORAL | 1 refills | Status: DC

## 2021-05-16 ENCOUNTER — Other Ambulatory Visit: Payer: Self-pay | Admitting: Internal Medicine

## 2021-05-26 ENCOUNTER — Ambulatory Visit (INDEPENDENT_AMBULATORY_CARE_PROVIDER_SITE_OTHER): Payer: BC Managed Care – PPO | Admitting: Internal Medicine

## 2021-05-26 ENCOUNTER — Encounter: Payer: Self-pay | Admitting: Internal Medicine

## 2021-05-26 ENCOUNTER — Other Ambulatory Visit: Payer: Self-pay

## 2021-05-26 VITALS — BP 134/90 | HR 69 | Temp 95.8°F | Ht 67.0 in | Wt 209.8 lb

## 2021-05-26 DIAGNOSIS — Z23 Encounter for immunization: Secondary | ICD-10-CM

## 2021-05-26 DIAGNOSIS — E1169 Type 2 diabetes mellitus with other specified complication: Secondary | ICD-10-CM

## 2021-05-26 DIAGNOSIS — E538 Deficiency of other specified B group vitamins: Secondary | ICD-10-CM | POA: Diagnosis not present

## 2021-05-26 DIAGNOSIS — E669 Obesity, unspecified: Secondary | ICD-10-CM | POA: Diagnosis not present

## 2021-05-26 DIAGNOSIS — E1159 Type 2 diabetes mellitus with other circulatory complications: Secondary | ICD-10-CM

## 2021-05-26 DIAGNOSIS — F411 Generalized anxiety disorder: Secondary | ICD-10-CM | POA: Diagnosis not present

## 2021-05-26 DIAGNOSIS — I152 Hypertension secondary to endocrine disorders: Secondary | ICD-10-CM

## 2021-05-26 MED ORDER — TRAZODONE HCL 50 MG PO TABS: 25.0000 mg | ORAL_TABLET | Freq: Every evening | ORAL | 3 refills | Status: DC | PRN

## 2021-05-26 MED ORDER — METFORMIN HCL 500 MG PO TABS: 500.0000 mg | ORAL_TABLET | Freq: Every day | ORAL | 1 refills | Status: DC

## 2021-05-26 MED ORDER — OZEMPIC (0.25 OR 0.5 MG/DOSE) 2 MG/1.5ML ~~LOC~~ SOPN: 0.5000 mg | PEN_INJECTOR | SUBCUTANEOUS | 1 refills | Status: DC

## 2021-05-26 NOTE — Patient Instructions (Addendum)
I am  recommending adding Ozempic   to your once daily metformin to help you lose weight  .  The sample  contains 6  WEEKLY doses at the starting dose of 0.25 mg     ozempic is a medication that is taken as a weekly subcutaneous injection. It is not insulin.  It  causes your pancreas to increase its  own insulin secretion  And also slows down the emptying of your stomach,  So it decreases your appetite and helps you lose weight.  The dose for the first 4 weekly doses is 0.25 mg.  You may have mild nausea on the first or second day but this should resolve.  If not  ,  stop the medication.   As long as you are losing weight,  you can continue the dose you are on  (0.25 mg)   Only increase the dose to 0.5 mg after 4 weeks if your weight has plateaued.  Let me know when you need a refill and what dose you are taking.    I am prescribing trazodone for your insomnia.  Start with 1/2 tablet 30 to 60 minutes before bedtime .  Increase dose to 50 mg if no improvement   Return  in 3 months,  fasting labs prior to visit
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
## 2021-05-26 NOTE — Assessment & Plan Note (Addendum)
Historically well-controlled on diet alone .  hemoglobin A1c has been consistently at or  less than 7.0 . Patient is up-to-date on eye exams and foot exam is normal today. Patient has no microalbuminuria. Patient is not on statin by choice  . Discussed role of Ozempic  in managing appetite   Lab Results  Component Value Date   HGBA1C 6.5 04/24/2021   Lab Results  Component Value Date   MICROALBUR <0.7 04/24/2021   MICROALBUR 1.4 10/04/2018

## 2021-06-17 ENCOUNTER — Other Ambulatory Visit: Payer: Self-pay | Admitting: Internal Medicine

## 2021-06-25 ENCOUNTER — Encounter: Payer: Self-pay | Admitting: Emergency Medicine

## 2021-06-25 ENCOUNTER — Ambulatory Visit
Admission: EM | Admit: 2021-06-25 | Discharge: 2021-06-25 | Disposition: A | Discharge status: Home / Self Care | Payer: BC Managed Care – PPO | Attending: Emergency Medicine | Admitting: Emergency Medicine

## 2021-06-25 ENCOUNTER — Other Ambulatory Visit: Payer: Self-pay

## 2021-06-25 ENCOUNTER — Ambulatory Visit (INDEPENDENT_AMBULATORY_CARE_PROVIDER_SITE_OTHER): Payer: BC Managed Care – PPO

## 2021-06-25 DIAGNOSIS — M79671 Pain in right foot: Secondary | ICD-10-CM

## 2021-06-25 NOTE — ED Provider Notes (Signed)
Mary Bryant    CSN: [id number] Arrival date & time: 06/25/21  1128      History   Chief Complaint Chief Complaint  Patient presents with   Toe Pain    HPI Mary Bryant is a 55 y.o. female.  Patient presents with pain in her right foot x 2 days.  The pain started when she dropped a bookcase on her foot.  No open wounds, numbness, weakness, paresthesias, or other symptoms.  Treatment at home with rest and elevation.  No other injury.  Her medical history includes hypertension.  The history is provided by the patient and medical records.   Past Medical History:  Diagnosis Date   Dental bridge present    No bridge.  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   PONV (postoperative nausea and vomiting)    rhinitis allergic    Trigeminal neuralgia    Wears contact lenses    Wears hearing aid    bilateral    Patient Active Problem List   Diagnosis Date Noted   Suspected COVID-19 virus infection 08/30/2020   Iron deficiency anemia due to chronic blood loss    Stricture and stenosis of esophagus    Polyp of sigmoid colon    Iron deficiency 02/27/2019   Snoring 01/03/2018   Chronic fatigue 01/03/2018   B12 deficiency 01/03/2018   Obesity, diabetes, and hypertension syndrome ([place]) 10/01/2017   Chronic migraine 05/16/2017   Hospital discharge follow-up 05/30/2016   Encounter for preventive health examination 05/10/2015   Menopausal hot flushes 05/10/2015   S/P abdominal hysterectomy 05/08/2015   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/2015   Long-term use of high-risk medication 04/21/2014   Trigeminal neuralgia of left side of face 03/22/2014   Chronic headaches 11/21/2013    Generalized anxiety disorder with panic attacks 10/25/2013   Hiatal hernia with GERD and esophagitis 10/25/2013   H pylori ulcer 10/25/2013   Anxiety 10/25/2013   History of breast lump/mass excision 08/08/2013   Vitamin D deficiency 06/10/2013   Generalized anxiety disorder 06/08/2013   Screening for breast cancer 06/08/2013   CAD in native artery 06/08/2013   H/O left bundle branch block 06/08/2013   Chest pain 04/04/2013   Essential hypertension    Insomnia     Past Surgical History:  Procedure Laterality Date   ABDOMINAL HYSTERECTOMY  2000   no history of ca    CHOLECYSTECTOMY  2008   COLONOSCOPY WITH PROPOFOL N/A 05/11/2019   Procedure: COLONOSCOPY WITH PROPOFOL;  Surgeon: Lucilla Lame, MD;  Location: [facility];  Service: Endoscopy;  Laterality: N/A;   ESOPHAGEAL DILATION  05/11/2019   Procedure: ESOPHAGEAL DILATION;  Surgeon: Lucilla Lame, MD;  Location: [facility];  Service: Endoscopy;;   ESOPHAGOGASTRODUODENOSCOPY (EGD) WITH PROPOFOL N/A 05/12/2016   Procedure: ESOPHAGOGASTRODUODENOSCOPY (EGD) WITH PROPOFOL;  Surgeon: Jonathon Bellows, MD;  Location: [facility];  Service: Endoscopy;  Laterality: N/A;   ESOPHAGOGASTRODUODENOSCOPY (EGD) WITH PROPOFOL N/A 05/11/2019   Procedure: ESOPHAGOGASTRODUODENOSCOPY (EGD) WITH PROPOFOL;  Surgeon: Lucilla Lame, MD;  Location: [facility];  Service: Endoscopy;  Laterality: N/A;  Diabetic - oral meds   POLYPECTOMY  05/11/2019   Procedure: POLYPECTOMY;  Surgeon: Lucilla Lame, MD;  Location: [facility];  Service: Endoscopy;;    OB History     Gravida  3   Para  2   Term      Preterm      AB  1   Living  2      SAB  1   IAB      Ectopic      Multiple      Live Births           Obstetric Comments  1st Menstrual Cycle:  15 1st Pregnancy:  24          Home Medications    Prior to Admission medications   Medication Sig Start Date End Date Taking? Authorizing Provider   acetaminophen (TYLENOL) 500 MG tablet Take 1,000 mg by mouth every 6 (six) hours as needed for mild pain.    [provider]  acyclovir (ZOVIRAX) 400 MG tablet Take 1 tablet (400 mg total) by mouth 5 (five) times daily. For one week  As needed for blisters 04/16/20   Crecencio Mc, MD  ALPRAZolam Duanne Moron) 0.25 MG tablet Take 1 tablet (0.25 mg total) by mouth daily as needed for anxiety. 04/24/21   Crecencio Mc, MD  betamethasone valerate (VALISONE) 0.1 % cream Apply topically 2 (two) times daily. 04/16/20   Crecencio Mc, MD  cyanocobalamin (,VITAMIN B-12,) 1000 MCG/ML injection INJECT 1000 MCG IM WEEKLY X 3, THEN MONTHLY THEREAFTER 07/03/18   Crecencio Mc, MD  hydrOXYzine (VISTARIL) 25 MG capsule Take 1 capsule (25 mg total) by mouth every 8 (eight) hours as needed for itching or anxiety. 04/24/21   Crecencio Mc, MD  metFORMIN (GLUCOPHAGE) 500 MG tablet Take 1 tablet (500 mg total) by mouth daily with breakfast. 05/26/21   Crecencio Mc, MD  metoprolol succinate (TOPROL-XL) 50 MG 24 hr tablet Take 1 tablet (50 mg total) by mouth daily. Take with or immediately following a meal. 05/04/21   Crecencio Mc, MD  ondansetron (ZOFRAN-ODT) 4 MG disintegrating tablet TAKE 1 TABLET BY MOUTH EVERY 8 HOURS AS NEEDED FOR NAUSEA AND VOMITING 01/23/21   Crecencio Mc, MD  pantoprazole (PROTONIX) 40 MG tablet Take 1 tablet (40 mg total) by mouth daily. 05/04/21   Crecencio Mc, MD  Semaglutide,0.25 or 0.5MG /DOS, (OZEMPIC, 0.25 OR 0.5 MG/DOSE,) 2 MG/1.5ML SOPN Inject 0.5 mg into the skin once a week. 05/26/21   Crecencio Mc, MD  sertraline (ZOLOFT) 50 MG tablet TAKE 1 TABLET BY MOUTH EVERY DAY 05/18/21   Crecencio Mc, MD  silver sulfADIAZINE (SILVADENE) 1 % cream Apply 1 application topically daily. 03/05/21   Crecencio Mc, MD  SODIUM FLUORIDE 5000 SENSITIVE 1.1-5 % PSTE BRUSH TWICE A DAY IN PLACE OF REGULAR TOOTHPASTE Patient not taking: Reported on 05/26/2021 10/09/19   [provider]  Syringe/Needle, Disp, (SYRINGE 3CC/25GX1") 25G X 1" 3 ML MISC Use for b12 injections 01/04/18   Crecencio Mc, MD  traZODone (DESYREL) 50 MG tablet TAKE 0.5-1 TABLETS BY MOUTH AT BEDTIME AS NEEDED FOR SLEEP. 06/17/21   Crecencio Mc, MD    Family History Family History  Problem Relation Age of Onset   Cancer Father        Bladder,  in Hospice   Diabetes type II Father    COPD Father    Coronary artery disease Father    Heart disease Father  26   Diabetes Mother    Hyperlipidemia Mother    Hypertension Mother    Early death Paternal Grandmother    Heart disease Paternal Grandmother        CAD   Prostate cancer Paternal Alanda Slim' disease Daughter     Social History Social History   Tobacco Use   Smoking status: Never   Smokeless tobacco: Never  Vaping Use   Vaping Use: Never used  Substance Use Topics   Alcohol use: No   Drug use: No     Allergies   Promethazine, Benadryl [diphenhydramine hcl], and Phenothiazines   Review of Systems Review of Systems  Constitutional:  Negative for chills and fever.  Musculoskeletal:  Positive for arthralgias and gait problem. Negative for joint swelling.  Skin:  Negative for color change, rash and wound.  Neurological:  Negative for weakness and numbness.  All other systems reviewed and are negative.   Physical Exam Triage Vital Signs ED Triage Vitals  Enc Vitals Group     BP      Pulse      Resp      Temp      Temp src      SpO2      Weight      Height      Head Circumference      Peak Flow      Pain Score      Pain Loc      Pain Edu?      Excl. in [place]?    No data found.  Updated Vital Signs BP 137/90 (BP Location: Left Arm)   Pulse 76   Temp 98.8 F (37.1 C) (Oral)   Resp 18   SpO2 95%   Visual Acuity Right Eye Distance:   Left Eye Distance:   Bilateral Distance:    Right Eye Near:   Left Eye Near:    Bilateral Near:     Physical Exam Vitals and nursing note  reviewed.  Constitutional:      General: She is not in acute distress.    Appearance: She is well-developed. She is not ill-appearing.  HENT:     Mouth/Throat:     Mouth: Mucous membranes are moist.  Cardiovascular:     Rate and Rhythm: Normal rate and regular rhythm.     Heart sounds: Normal heart sounds.  Pulmonary:     Effort: Pulmonary effort is normal. No respiratory distress.     Breath sounds: Normal breath sounds.  Musculoskeletal:        General: Tenderness present. No swelling or deformity. Normal range of motion.     Cervical back: Neck supple.       Feet:  Skin:    General: Skin is warm and dry.     Capillary Refill: Capillary refill takes less than 2 seconds.     Findings: No bruising, erythema, lesion or rash.  Neurological:     General: No focal deficit present.     Mental Status: She is alert and oriented to person, place, and time.     Sensory: No sensory deficit.     Motor: No weakness.     Gait: Gait normal.  Psychiatric:        Mood and Affect: Mood normal.        Behavior: Behavior normal.     UC Treatments / Results  Labs (all labs ordered are listed, but only abnormal results are displayed) Labs Reviewed -  No data to display  EKG   Radiology DG Foot Complete Right  Result Date: 06/25/2021 CLINICAL DATA:  55 year old female with right foot pain after injury. EXAM: RIGHT FOOT COMPLETE - 3+ VIEW COMPARISON:  04/21/2017 FINDINGS: There is no evidence of fracture or dislocation. There is no evidence of arthropathy or other focal bone abnormality. Soft tissues are unremarkable. IMPRESSION: No acute fracture or malalignment. Electronically Signed   By: Ruthann Cancer M.D.   On: 06/25/2021 12:14    Procedures Procedures (including critical care time)  Medications Ordered in UC Medications - No data to display  Initial Impression / Assessment and Plan / UC Course  I have reviewed the triage vital signs and the nursing notes.  Pertinent labs &  imaging results that were available during my care of the patient were reviewed by me and considered in my medical decision making (see chart for details).    Right foot pain.  Xray negative.  Treating with ibuprofen, rest, elevation, ice packs.  Instructed patient to follow-up with orthopedics if her symptoms are not improving.  She agrees to plan of care.      Final Clinical Impressions(s) / UC Diagnoses   Final diagnoses:  Right foot pain     Discharge Instructions      Take ibuprofen as needed.  Rest and elevate your foot.  Apply ice packs 2-3 times a day for up to 20 minutes each.  Follow up with an orthopedist if your symptoms are not improving.        ED Prescriptions   None    PDMP not reviewed this encounter.   Sharion Balloon, NP 06/25/21 1224

## 2021-06-25 NOTE — Discharge Instructions (Addendum)
Take ibuprofen as needed.  Rest and elevate your foot.  Apply ice packs 2-3 times a day for up to 20 minutes each.  Follow up with an orthopedist if your symptoms are not improving.

## 2021-06-25 NOTE — ED Triage Notes (Signed)
Pt dropped a bookcase on her right foot. She has right great toe pain for a couple of days.

## 2021-06-26 ENCOUNTER — Encounter: Payer: Self-pay | Admitting: Internal Medicine

## 2021-07-06 ENCOUNTER — Encounter: Payer: Self-pay | Admitting: Internal Medicine

## 2021-07-06 ENCOUNTER — Telehealth: Payer: Self-pay

## 2021-07-06 NOTE — Telephone Encounter (Signed)
Dr Derrel Nip we chatted at my last visit about gout in my foot I have had for two weeks a lot of pain in my right big toe can I please come get tested by blood work to rule out any gout I'm offtodat the 19th through the 21st

## 2021-07-06 NOTE — Telephone Encounter (Signed)
Attempted to call. No answer. Mail box is full.

## 2021-07-15 ENCOUNTER — Encounter: Payer: Self-pay | Admitting: Internal Medicine

## 2021-07-15 MED ORDER — CYANOCOBALAMIN 1000 MCG/ML IJ SOLN: 1000.0000 ug | INTRAMUSCULAR | 1 refills | Status: DC

## 2021-07-15 MED ORDER — OZEMPIC (0.25 OR 0.5 MG/DOSE) 2 MG/1.5ML ~~LOC~~ SOPN: 0.5000 mg | PEN_INJECTOR | SUBCUTANEOUS | 1 refills | Status: DC

## 2021-07-17 ENCOUNTER — Other Ambulatory Visit: Payer: Self-pay

## 2021-07-17 MED ORDER — OZEMPIC (0.25 OR 0.5 MG/DOSE) 2 MG/1.5ML ~~LOC~~ SOPN: 0.5000 mg | PEN_INJECTOR | SUBCUTANEOUS | 1 refills | Status: DC

## 2021-07-17 NOTE — Telephone Encounter (Signed)
Spoke with pt to let her know that the medication has been received at [facility] that I was on the phone with them until they received and stated they would get the rx ready. Pt gave a verbal understanding.

## 2021-07-17 NOTE — Telephone Encounter (Signed)
Pt called in stating that she went to pharmacy three time. Pt stated that pharmacist advise her that medication wasn't sent over. Pt stated that she been without medication for a week now. Pt request callback

## 2021-07-28 ENCOUNTER — Other Ambulatory Visit: Payer: Self-pay

## 2021-07-28 ENCOUNTER — Other Ambulatory Visit (INDEPENDENT_AMBULATORY_CARE_PROVIDER_SITE_OTHER): Payer: BC Managed Care – PPO

## 2021-07-28 DIAGNOSIS — I152 Hypertension secondary to endocrine disorders: Secondary | ICD-10-CM

## 2021-07-28 DIAGNOSIS — E1169 Type 2 diabetes mellitus with other specified complication: Secondary | ICD-10-CM | POA: Diagnosis not present

## 2021-07-28 DIAGNOSIS — E1159 Type 2 diabetes mellitus with other circulatory complications: Secondary | ICD-10-CM | POA: Diagnosis not present

## 2021-07-28 DIAGNOSIS — E538 Deficiency of other specified B group vitamins: Secondary | ICD-10-CM

## 2021-07-28 DIAGNOSIS — E669 Obesity, unspecified: Secondary | ICD-10-CM

## 2021-07-28 LAB — LIPID PANEL
Cholesterol: 171 mg/dL (ref 0–200)
HDL: 47.3 mg/dL (ref >39.00)
LDL Cholesterol: 94 mg/dL (ref 0–99)
NonHDL: 124.08
Total CHOL/HDL Ratio: 4
Triglycerides: 152 mg/dL — ABNORMAL HIGH (ref 0.0–149.0)
VLDL: 30.4 mg/dL (ref 0.0–40.0)

## 2021-07-28 LAB — COMPREHENSIVE METABOLIC PANEL
ALT: 28 U/L (ref 0–35)
AST: 22 U/L (ref 0–37)
Albumin: 4.3 g/dL (ref 3.5–5.2)
Alkaline Phosphatase: 69 U/L (ref 39–117)
BUN: 11 mg/dL (ref 6–23)
CO2: 26 mEq/L (ref 19–32)
Calcium: 9.6 mg/dL (ref 8.4–10.5)
Chloride: 102 mEq/L (ref 96–112)
Creatinine, Ser: 0.95 mg/dL (ref 0.40–1.20)
GFR: 68.15 mL/min (ref >60.00)
Glucose, Bld: 100 mg/dL — ABNORMAL HIGH (ref 70–99)
Potassium: 3.9 mEq/L (ref 3.5–5.1)
Sodium: 137 mEq/L (ref 135–145)
Total Bilirubin: 0.4 mg/dL (ref 0.2–1.2)
Total Protein: 7.4 g/dL (ref 6.0–8.3)

## 2021-07-28 LAB — CBC WITH DIFFERENTIAL/PLATELET
Basophils Absolute: 0.1 10*3/uL (ref 0.0–0.1)
Basophils Relative: 1 % (ref 0.0–3.0)
Eosinophils Absolute: 0.2 10*3/uL (ref 0.0–0.7)
Eosinophils Relative: 2.5 % (ref 0.0–5.0)
HCT: 41 % (ref 36.0–46.0)
Hemoglobin: 13.2 g/dL (ref 12.0–15.0)
Lymphocytes Relative: 24.6 % (ref 12.0–46.0)
Lymphs Abs: 2 10*3/uL (ref 0.7–4.0)
MCHC: 32.3 g/dL (ref 30.0–36.0)
MCV: 85.6 fl (ref 78.0–100.0)
Monocytes Absolute: 0.7 10*3/uL (ref 0.1–1.0)
Monocytes Relative: 9.2 % (ref 3.0–12.0)
Neutro Abs: 5.1 10*3/uL (ref 1.4–7.7)
Neutrophils Relative %: 62.7 % (ref 43.0–77.0)
Platelets: 348 10*3/uL (ref 150.0–400.0)
RBC: 4.8 Mil/uL (ref 3.87–5.11)
RDW: 16 % — ABNORMAL HIGH (ref 11.5–15.5)
WBC: 8.1 10*3/uL (ref 4.0–10.5)

## 2021-07-28 LAB — HEMOGLOBIN A1C: Hgb A1c MFr Bld: 6 % (ref 4.6–6.5)

## 2021-07-30 ENCOUNTER — Ambulatory Visit: Payer: BC Managed Care – PPO | Admitting: Internal Medicine

## 2021-07-30 ENCOUNTER — Other Ambulatory Visit: Payer: Self-pay

## 2021-07-30 ENCOUNTER — Encounter: Payer: Self-pay | Admitting: Internal Medicine

## 2021-07-30 VITALS — BP 110/72 | HR 83 | Temp 98.2°F | Ht 67.0 in | Wt 197.0 lb

## 2021-07-30 DIAGNOSIS — I152 Hypertension secondary to endocrine disorders: Secondary | ICD-10-CM

## 2021-07-30 DIAGNOSIS — E1159 Type 2 diabetes mellitus with other circulatory complications: Secondary | ICD-10-CM | POA: Diagnosis not present

## 2021-07-30 DIAGNOSIS — E1169 Type 2 diabetes mellitus with other specified complication: Secondary | ICD-10-CM | POA: Diagnosis not present

## 2021-07-30 DIAGNOSIS — M109 Gout, unspecified: Secondary | ICD-10-CM | POA: Diagnosis not present

## 2021-07-30 DIAGNOSIS — E669 Obesity, unspecified: Secondary | ICD-10-CM | POA: Diagnosis not present

## 2021-07-30 NOTE — Progress Notes (Signed)
Subjective:  Patient ID: Mary Bryant, female    DOB: Jul 26, 1965  Age: 56 y.o. MRN: [id number]  CC: The primary encounter diagnosis was Acute gout involving toe of right foot, unspecified cause. A diagnosis of Obesity, diabetes, and hypertension syndrome ([place]) was also pertinent to this visit.   This visit occurred during the SARS-CoV-2 public health emergency.  Safety protocols were in place, including screening questions prior to the visit, additional usage of staff PPE, and extensive cleaning of exam room while observing appropriate contact time as indicated for disinfecting solutions.    HPI ALTAGRACIA RONE presents for  Chief Complaint  Patient presents with   Follow-up    2 month follow up on diabetes and insomnia. At llast appt pt was prescribed ozempic and trazodone.    1) T2DM with obesity   has lost 13 lbs lost since November with weekly use of  Ozempic .  However her appetite is so diminshed that she often eats only once daily, in the evening .   Has Nausea occurring almost daily on the 0.5 mg dose.  Worse on the first 2 days.  Wants to continue medication  Riding a stationery bike daily .  Personal goal is 175 lbs.  . Checking  blood sugars less than once daily at variable times, usually only if she feels she may be having a hypoglycemic event. .  BS have been under 130 fasting and < 150 post prandially.  Denies any recent hypoglyemic events.  Taking   medications as directed. Following a carbohydrate modified diet 6 days per week. Denies numbness, burning and tingling of extremities. Appetite is good.    2)  /recent episode of gout involving right great toe.   X rays normal.  Outpatient Medications Prior to Visit  Medication Sig Dispense Refill   acetaminophen (TYLENOL) 500 MG tablet Take 1,000 mg by mouth every 6 (six) hours as needed for mild pain.     acyclovir (ZOVIRAX) 400 MG tablet Take 1 tablet (400 mg total) by mouth 5 (five) times daily. For one week  As needed for  blisters 35 tablet 1   ALPRAZolam (XANAX) 0.25 MG tablet Take 1 tablet (0.25 mg total) by mouth daily as needed for anxiety. 30 tablet 5   betamethasone valerate (VALISONE) 0.1 % cream Apply topically 2 (two) times daily. 30 g 0   cyanocobalamin (,VITAMIN B-12,) 1000 MCG/ML injection Inject 1 mL (1,000 mcg total) into the muscle every 30 (thirty) days. 10 mL 1   hydrOXYzine (VISTARIL) 25 MG capsule Take 1 capsule (25 mg total) by mouth every 8 (eight) hours as needed for itching or anxiety. 2 capsule 0   metFORMIN (GLUCOPHAGE) 500 MG tablet Take 1 tablet (500 mg total) by mouth daily with breakfast. 90 tablet 1   metoprolol succinate (TOPROL-XL) 50 MG 24 hr tablet Take 1 tablet (50 mg total) by mouth daily. Take with or immediately following a meal. 90 tablet 1   ondansetron (ZOFRAN-ODT) 4 MG disintegrating tablet TAKE 1 TABLET BY MOUTH EVERY 8 HOURS AS NEEDED FOR NAUSEA AND VOMITING 18 tablet 1   pantoprazole (PROTONIX) 40 MG tablet Take 1 tablet (40 mg total) by mouth daily. 90 tablet 1   Semaglutide,0.25 or 0.5MG /DOS, (OZEMPIC, 0.25 OR 0.5 MG/DOSE,) 2 MG/1.5ML SOPN Inject 0.5 mg into the skin once a week. 1.5 mL 1   sertraline (ZOLOFT) 50 MG tablet TAKE 1 TABLET BY MOUTH EVERY DAY 90 tablet 2   silver sulfADIAZINE (SILVADENE) 1 %  cream Apply 1 application topically daily. 400 g 1   SODIUM FLUORIDE 5000 SENSITIVE 1.1-5 % PSTE      Syringe/Needle, Disp, (SYRINGE 3CC/25GX1") 25G X 1" 3 ML MISC Use for b12 injections 50 each 0   traZODone (DESYREL) 50 MG tablet TAKE 0.5-1 TABLETS BY MOUTH AT BEDTIME AS NEEDED FOR SLEEP. 90 tablet 2   Facility-Administered Medications Prior to Visit  Medication Dose Route Frequency Provider Last Rate Last Admin   gadopentetate dimeglumine (MAGNEVIST) injection 20 mL  20 mL Intravenous Once PRN Marcial Pacas, MD        Review of Systems;  Patient denies headache, fevers, malaise, unintentional weight loss, skin rash, eye pain, sinus congestion and sinus pain, sore  throat, dysphagia,  hemoptysis , cough, dyspnea, wheezing, chest pain, palpitations, orthopnea, edema, abdominal pain, nausea, melena, diarrhea, constipation, flank pain, dysuria, hematuria, urinary  Frequency, nocturia, numbness, tingling, seizures,  Focal weakness, Loss of consciousness,  Tremor, insomnia, depression, anxiety, and suicidal ideation.      Objective:  BP 110/72 (BP Location: Left Arm, Patient Position: Sitting, Cuff Size: Large)    Pulse 83    Temp 98.2 F (36.8 C) (Oral)    Ht 5\' 7"  (1.702 m)    Wt 197 lb (89.4 kg)    SpO2 96%    BMI 30.85 kg/m   BP Readings from Last 3 Encounters:  07/30/21 110/72  06/25/21 137/90  05/26/21 134/90    Wt Readings from Last 3 Encounters:  07/30/21 197 lb (89.4 kg)  05/26/21 209 lb 12.8 oz (95.2 kg)  04/24/21 212 lb (96.2 kg)    General appearance: alert, cooperative and appears stated age Ears: normal TM's and external ear canals both ears Throat: lips, mucosa, and tongue normal; teeth and gums normal Neck: no adenopathy, no carotid bruit, supple, symmetrical, trachea midline and thyroid not enlarged, symmetric, no tenderness/mass/nodules Back: symmetric, no curvature. ROM normal. No CVA tenderness. Lungs: clear to auscultation bilaterally Heart: regular rate and rhythm, S1, S2 normal, no murmur, click, rub or gallop Abdomen: soft, non-tender; bowel sounds normal; no masses,  no organomegaly Pulses: 2+ and symmetric Skin: Skin color, texture, turgor normal. No rashes or lesions Lymph nodes: Cervical, supraclavicular, and axillary nodes normal.  Lab Results  Component Value Date   HGBA1C 6.0 07/28/2021   HGBA1C 6.5 04/24/2021   HGBA1C 6.2 02/26/2019    Lab Results  Component Value Date   CREATININE 0.95 07/28/2021   CREATININE 0.92 04/24/2021   CREATININE 0.95 02/26/2019    Lab Results  Component Value Date   WBC 8.1 07/28/2021   HGB 13.2 07/28/2021   HCT 41.0 07/28/2021   PLT 348.0 07/28/2021   GLUCOSE 100 (H)  07/28/2021   CHOL 171 07/28/2021   TRIG 152.0 (H) 07/28/2021   HDL 47.30 07/28/2021   LDLCALC 94 07/28/2021   ALT 28 07/28/2021   AST 22 07/28/2021   NA 137 07/28/2021   K 3.9 07/28/2021   CL 102 07/28/2021   CREATININE 0.95 07/28/2021   BUN 11 07/28/2021   CO2 26 07/28/2021   TSH 1.72 02/26/2019   HGBA1C 6.0 07/28/2021   MICROALBUR <0.7 04/24/2021    DG Foot Complete Right  Result Date: 06/25/2021 CLINICAL DATA:  56 year old female with right foot pain after injury. EXAM: RIGHT FOOT COMPLETE - 3+ VIEW COMPARISON:  04/21/2017 FINDINGS: There is no evidence of fracture or dislocation. There is no evidence of arthropathy or other focal bone abnormality. Soft tissues are unremarkable. IMPRESSION: No acute  fracture or malalignment. Electronically Signed   By: Ruthann Cancer M.D.   On: 06/25/2021 12:14    Assessment & Plan:   Problem List Items Addressed This Visit     Obesity, diabetes, and hypertension syndrome ([place])     Excellent control on Ozempic .  hemoglobin A1c has been consistently at or  less than 7.0 . Patient is up-to-date on eye exams and foot exam is normal today. Patient has no microalbuminuria. Patient is not on statin by choice    Lab Results  Component Value Date   HGBA1C 6.0 07/28/2021   Lab Results  Component Value Date   MICROALBUR <0.7 04/24/2021   MICROALBUR 1.4 10/04/2018           Other Visit Diagnoses     Acute gout involving toe of right foot, unspecified cause    -  Primary   Relevant Orders   Sedimentation rate   C-reactive protein       I am having Christalynn S. Hendricksen maintain her acetaminophen, SYRINGE 3CC/25GX1", Sodium Fluoride 5000 Sensitive, betamethasone valerate, acyclovir, ondansetron, silver sulfADIAZINE, ALPRAZolam, hydrOXYzine, metoprolol succinate, pantoprazole, sertraline, metFORMIN, traZODone, cyanocobalamin, and Ozempic (0.25 or 0.5 MG/DOSE).  No orders of the defined types were placed in this encounter.    I provided   30 minutes of  face-to-face time during this encounter reviewing patient's current problems and past surgeries, labs and imaging studies, providing counseling on the above mentioned problems , and coordination  of care .   Follow-up: No follow-ups on file.   Crecencio Mc, MD

## 2021-07-30 NOTE — Assessment & Plan Note (Signed)
Excellent control on Ozempic .  hemoglobin A1c has been consistently at or  less than 7.0 . Patient is up-to-date on eye exams and foot exam is normal today. Patient has no microalbuminuria. Patient is not on statin by choice    Lab Results  Component Value Date   HGBA1C 6.0 07/28/2021   Lab Results  Component Value Date   MICROALBUR <0.7 04/24/2021   MICROALBUR 1.4 10/04/2018

## 2021-07-30 NOTE — Patient Instructions (Addendum)
Don't skip meals  even if not hungry.  Just choose wisely :  Premier protein for breakfast  if not feeling like eating   Low carb power bowls and "Steamers" by [employer] choice for lunch  Apple for midday snack  ( or blueberries,  strawberries )   Dinner:  needs to have a protein.  Try legumes or tofu,  eggs   Continue the 0.5 mg dose of ozempic.  You can try lengthening the interval to every 10 days if it will help resolve the nausea.    Labs ordered for next occurrence of gout.  Call office for lab appt.   Return in 4 months for follow up on diabetes

## 2021-09-02 ENCOUNTER — Encounter: Payer: Self-pay | Admitting: Internal Medicine

## 2021-09-04 ENCOUNTER — Other Ambulatory Visit: Payer: Self-pay | Admitting: Internal Medicine

## 2021-09-04 MED ORDER — SEMAGLUTIDE (2 MG/DOSE) 8 MG/3ML ~~LOC~~ SOPN: 2.0000 mg | PEN_INJECTOR | SUBCUTANEOUS | 1 refills | Status: DC

## 2021-09-04 MED ORDER — ONDANSETRON 4 MG PO TBDP: ORAL_TABLET | ORAL | 2 refills | Status: DC

## 2021-09-08 LAB — HM DIABETES EYE EXAM

## 2021-09-13 ENCOUNTER — Other Ambulatory Visit: Payer: Self-pay | Admitting: Internal Medicine

## 2021-09-30 ENCOUNTER — Other Ambulatory Visit: Payer: Self-pay | Admitting: Internal Medicine

## 2021-10-26 ENCOUNTER — Other Ambulatory Visit: Payer: Self-pay | Admitting: Internal Medicine

## 2021-10-28 ENCOUNTER — Encounter: Payer: Self-pay | Admitting: Internal Medicine

## 2021-11-04 ENCOUNTER — Encounter: Payer: Self-pay | Admitting: Internal Medicine

## 2021-11-06 ENCOUNTER — Telehealth: Payer: Self-pay

## 2021-11-06 NOTE — Telephone Encounter (Signed)
FMLA paperwork has been placed in red folder.  ?

## 2021-11-09 DIAGNOSIS — Z0279 Encounter for issue of other medical certificate: Secondary | ICD-10-CM

## 2021-11-09 NOTE — Telephone Encounter (Signed)
LMTCB. Need to let pt know that paperwork has been completed and placed up front for pick up.  ?

## 2021-11-10 NOTE — Telephone Encounter (Signed)
Spoke with pt's mother and she stated that she would come by today to pick up the [facility] paperwork.  ?

## 2021-11-16 ENCOUNTER — Other Ambulatory Visit: Payer: Self-pay | Admitting: Internal Medicine

## 2021-11-18 ENCOUNTER — Encounter: Payer: Self-pay | Admitting: Internal Medicine

## 2021-11-27 ENCOUNTER — Other Ambulatory Visit (INDEPENDENT_AMBULATORY_CARE_PROVIDER_SITE_OTHER): Payer: BC Managed Care – PPO

## 2021-11-27 DIAGNOSIS — M109 Gout, unspecified: Secondary | ICD-10-CM | POA: Diagnosis not present

## 2021-11-27 LAB — C-REACTIVE PROTEIN: CRP: <1 mg/dL (ref 0.5–20.0)

## 2021-11-27 LAB — SEDIMENTATION RATE: Sed Rate: 30 mm/hr (ref 0–30)

## 2021-11-28 ENCOUNTER — Other Ambulatory Visit: Payer: Self-pay | Admitting: Internal Medicine

## 2021-11-30 ENCOUNTER — Encounter: Payer: Self-pay | Admitting: Internal Medicine

## 2021-11-30 ENCOUNTER — Ambulatory Visit (INDEPENDENT_AMBULATORY_CARE_PROVIDER_SITE_OTHER): Payer: BC Managed Care – PPO | Admitting: Internal Medicine

## 2021-11-30 VITALS — BP 120/84 | HR 85 | Temp 97.9°F | Ht 67.0 in | Wt 175.8 lb

## 2021-11-30 DIAGNOSIS — R5383 Other fatigue: Secondary | ICD-10-CM

## 2021-11-30 DIAGNOSIS — E538 Deficiency of other specified B group vitamins: Secondary | ICD-10-CM

## 2021-11-30 DIAGNOSIS — I152 Hypertension secondary to endocrine disorders: Secondary | ICD-10-CM | POA: Diagnosis not present

## 2021-11-30 DIAGNOSIS — F419 Anxiety disorder, unspecified: Secondary | ICD-10-CM

## 2021-11-30 DIAGNOSIS — E1159 Type 2 diabetes mellitus with other circulatory complications: Secondary | ICD-10-CM | POA: Diagnosis not present

## 2021-11-30 DIAGNOSIS — E669 Obesity, unspecified: Secondary | ICD-10-CM | POA: Diagnosis not present

## 2021-11-30 DIAGNOSIS — E1169 Type 2 diabetes mellitus with other specified complication: Secondary | ICD-10-CM

## 2021-11-30 LAB — COMPREHENSIVE METABOLIC PANEL
ALT: 33 U/L (ref 0–35)
AST: 31 U/L (ref 0–37)
Albumin: 4.5 g/dL (ref 3.5–5.2)
Alkaline Phosphatase: 70 U/L (ref 39–117)
BUN: 9 mg/dL (ref 6–23)
CO2: 25 mEq/L (ref 19–32)
Calcium: 10.2 mg/dL (ref 8.4–10.5)
Chloride: 102 mEq/L (ref 96–112)
Creatinine, Ser: 1.04 mg/dL (ref 0.40–1.20)
GFR: 60.99 mL/min (ref >60.00)
Glucose, Bld: 90 mg/dL (ref 70–99)
Potassium: 4.8 mEq/L (ref 3.5–5.1)
Sodium: 139 mEq/L (ref 135–145)
Total Bilirubin: 0.5 mg/dL (ref 0.2–1.2)
Total Protein: 7.7 g/dL (ref 6.0–8.3)

## 2021-11-30 LAB — B12 AND FOLATE PANEL
Folate: 8.3 ng/mL (ref >5.9)
Vitamin B-12: 492 pg/mL (ref 211–911)

## 2021-11-30 LAB — LIPID PANEL
Cholesterol: 178 mg/dL (ref 0–200)
HDL: 62.4 mg/dL (ref >39.00)
LDL Cholesterol: 94 mg/dL (ref 0–99)
NonHDL: 115.62
Total CHOL/HDL Ratio: 3
Triglycerides: 107 mg/dL (ref 0.0–149.0)
VLDL: 21.4 mg/dL (ref 0.0–40.0)

## 2021-11-30 LAB — TSH: TSH: 0.95 u[IU]/mL (ref 0.35–5.50)

## 2021-11-30 LAB — HEMOGLOBIN A1C: Hgb A1c MFr Bld: 5.7 % (ref 4.6–6.5)

## 2021-11-30 LAB — LDL CHOLESTEROL, DIRECT: Direct LDL: 101 mg/dL

## 2021-11-30 MED ORDER — METFORMIN HCL ER 750 MG PO TB24: 750.0000 mg | ORAL_TABLET | Freq: Every day | ORAL | 1 refills | Status: DC

## 2021-11-30 MED ORDER — OZEMPIC (0.25 OR 0.5 MG/DOSE) 2 MG/1.5ML ~~LOC~~ SOPN: 0.5000 mg | PEN_INJECTOR | SUBCUTANEOUS | 1 refills | Status: DC

## 2021-11-30 NOTE — Assessment & Plan Note (Addendum)
Improved control with ozempic effecting significant weight loss.  Continue current dose; increase metformin to 750 mg xr.  Eye exam due ? ?Lab Results  ?Component Value Date  ? HGBA1C 6.0 07/28/2021  ? ?Lab Results  ?Component Value Date  ? MICROALBUR <0.7 04/24/2021  ? MICROALBUR 1.4 10/04/2018  ? ? ? ? ?

## 2021-11-30 NOTE — Assessment & Plan Note (Addendum)
recheching level today  With MMA and IF ab ?

## 2021-11-30 NOTE — Patient Instructions (Signed)
Congratulations!   ? ? ?Continue the current dose of ozempic for now.  Don't skip the protein shake in the morning,  even half of it is important  ? ? ?Changing metformin to controlled release 750 mg daily ? ? ?

## 2021-11-30 NOTE — Telephone Encounter (Signed)
Pt daughter called in stating that her mom said that Dr. Derrel Nip will accept her daughter as a new patient... No note in chart/checkout... Daughter name is Darol Destine telephone number is [phone] ?

## 2021-11-30 NOTE — Progress Notes (Signed)
? ?Subjective:  ?Patient ID: Mary Bryant, female    DOB: January 25, 1966  Age: 56 y.o. MRN: [id number] ? ?CC: The primary encounter diagnosis was Obesity, diabetes, and hypertension syndrome ([place]). Diagnoses of Fatigue, unspecified type, B12 deficiency, and Anxiety were also pertinent to this visit. ? ? ?HPI ?Mary Bryant presents for  ?Chief Complaint  ?Patient presents with  ? Follow-up  ?  4 month follow up   ? ?1) T2DM with obesity:  taking Ozempic 0.5  mg weekly  and  metformin 500 mg daily.  She  has lost 22 lbs since January , by her scales a total of 38 lbs . Having some nausea without eating, no appetite,  nausea improves with eating small meals.   ? ?2) Fatigue: working 50 hours per week and taking care of parents at least 20 hours per week . Works from home.  Sleeping better now, averaging 6 hours per night using trazodone but takes a nap at lunch.  Wakes up energetic. Fasts until lunch . Feels that the stress of caring for her mother who has a  borderline/narcissistic manipulative personality disorder .  Stepfather's health is improving .   ? ? ?3) joint pain left first MT : esr and crp were checked recently and  normal.  Goes barefoot too much.  Discussed gout diagnosis  ? ? ?Outpatient Medications Prior to Visit  ?Medication Sig Dispense Refill  ? acetaminophen (TYLENOL) 500 MG tablet Take 1,000 mg by mouth every 6 (six) hours as needed for mild pain.    ? acyclovir (ZOVIRAX) 400 MG tablet Take 1 tablet (400 mg total) by mouth 5 (five) times daily. For one week  As needed for blisters 35 tablet 1  ? ALPRAZolam (XANAX) 0.25 MG tablet Take 1 tablet (0.25 mg total) by mouth daily as needed for anxiety. 30 tablet 5  ? betamethasone valerate (VALISONE) 0.1 % cream Apply topically 2 (two) times daily. 30 g 0  ? cyanocobalamin (,VITAMIN B-12,) 1000 MCG/ML injection INJECT 1 ML (1,000 MCG TOTAL) INTO THE MUSCLE EVERY 30 DAYS. 10 mL 1  ? hydrOXYzine (VISTARIL) 25 MG capsule Take 1 capsule (25 mg total) by  mouth every 8 (eight) hours as needed for itching or anxiety. 2 capsule 0  ? metoprolol succinate (TOPROL-XL) 50 MG 24 hr tablet TAKE 1 TABLET BY MOUTH DAILY. TAKE WITH OR IMMEDIATELY FOLLOWING A MEAL. 90 tablet 1  ? ondansetron (ZOFRAN-ODT) 4 MG disintegrating tablet TAKE 1 TABLET BY MOUTH EVERY 8 HOURS AS NEEDED FOR NAUSEA AND VOMITING 30 tablet 2  ? pantoprazole (PROTONIX) 40 MG tablet TAKE 1 TABLET BY MOUTH EVERY DAY 90 tablet 1  ? Semaglutide, 2 MG/DOSE, 8 MG/3ML SOPN Inject 2 mg as directed once a week. 3 mL 1  ? sertraline (ZOLOFT) 50 MG tablet TAKE 1 TABLET BY MOUTH EVERY DAY 90 tablet 2  ? silver sulfADIAZINE (SILVADENE) 1 % cream Apply 1 application topically daily. 400 g 1  ? SODIUM FLUORIDE 5000 SENSITIVE 1.1-5 % PSTE     ? Syringe/Needle, Disp, (SYRINGE 3CC/25GX1") 25G X 1" 3 ML MISC Use for b12 injections 50 each 0  ? traZODone (DESYREL) 50 MG tablet TAKE 0.5-1 TABLETS BY MOUTH AT BEDTIME AS NEEDED FOR SLEEP. 90 tablet 2  ? metFORMIN (GLUCOPHAGE) 500 MG tablet TAKE 1 TABLET BY MOUTH EVERY DAY WITH BREAKFAST 90 tablet 1  ? OZEMPIC, 0.25 OR 0.5 MG/DOSE, 2 MG/1.5ML SOPN INJECT 0.5 MG INTO THE SKIN ONCE A WEEK. 2 mL  1  ? ?Facility-Administered Medications Prior to Visit  ?Medication Dose Route Frequency Provider Last Rate Last Admin  ? gadopentetate dimeglumine (MAGNEVIST) injection 20 mL  20 mL Intravenous Once PRN Marcial Pacas, MD      ? ? ?Review of Systems; ? ?Patient denies headache, fevers, malaise, unintentional weight loss, skin rash, eye pain, sinus congestion and sinus pain, sore throat, dysphagia,  hemoptysis , cough, dyspnea, wheezing, chest pain, palpitations, orthopnea, edema, abdominal pain, nausea, melena, diarrhea, constipation, flank pain, dysuria, hematuria, urinary  Frequency, nocturia, numbness, tingling, seizures,  Focal weakness, Loss of consciousness,  Tremor, insomnia, depression, anxiety, and suicidal ideation.   ? ? ? ?Objective:  ?BP 120/84 (BP Location: Left Arm, Patient  Position: Sitting, Cuff Size: Normal)   Pulse 85   Temp 97.9 ?F (36.6 ?C) (Oral)   Ht 5' 7"  (1.702 m)   Wt 175 lb 12.8 oz (79.7 kg)   SpO2 96%   BMI 27.53 kg/m?  ? ?BP Readings from Last 3 Encounters:  ?11/30/21 120/84  ?07/30/21 110/72  ?06/25/21 137/90  ? ? ?Wt Readings from Last 3 Encounters:  ?11/30/21 175 lb 12.8 oz (79.7 kg)  ?07/30/21 197 lb (89.4 kg)  ?05/26/21 209 lb 12.8 oz (95.2 kg)  ? ? ?General appearance: alert, cooperative and appears stated age ?Ears: normal TM's and external ear canals both ears ?Throat: lips, mucosa, and tongue normal; teeth and gums normal ?Neck: no adenopathy, no carotid bruit, supple, symmetrical, trachea midline and thyroid not enlarged, symmetric, no tenderness/mass/nodules ?Back: symmetric, no curvature. ROM normal. No CVA tenderness. ?Lungs: clear to auscultation bilaterally ?Heart: regular rate and rhythm, S1, S2 normal, no murmur, click, rub or gallop ?Abdomen: soft, non-tender; bowel sounds normal; no masses,  no organomegaly ?Pulses: 2+ and symmetric ?Skin: Skin color, texture, turgor normal. No rashes or lesions ?Lymph nodes: Cervical, supraclavicular, and axillary nodes normal. ? ?Lab Results  ?Component Value Date  ? HGBA1C 6.0 07/28/2021  ? HGBA1C 6.5 04/24/2021  ? HGBA1C 6.2 02/26/2019  ? ? ?Lab Results  ?Component Value Date  ? CREATININE 0.95 07/28/2021  ? CREATININE 0.92 04/24/2021  ? CREATININE 0.95 02/26/2019  ? ? ?Lab Results  ?Component Value Date  ? WBC 8.1 07/28/2021  ? HGB 13.2 07/28/2021  ? HCT 41.0 07/28/2021  ? PLT 348.0 07/28/2021  ? GLUCOSE 100 (H) 07/28/2021  ? CHOL 171 07/28/2021  ? TRIG 152.0 (H) 07/28/2021  ? HDL 47.30 07/28/2021  ? [place] 94 07/28/2021  ? ALT 28 07/28/2021  ? AST 22 07/28/2021  ? NA 137 07/28/2021  ? K 3.9 07/28/2021  ? CL 102 07/28/2021  ? CREATININE 0.95 07/28/2021  ? BUN 11 07/28/2021  ? CO2 26 07/28/2021  ? TSH 1.72 02/26/2019  ? HGBA1C 6.0 07/28/2021  ? MICROALBUR <0.7 04/24/2021  ? ? ?DG Foot Complete Right ? ?Result  Date: 06/25/2021 ?CLINICAL DATA:  56 year old female with right foot pain after injury. EXAM: RIGHT FOOT COMPLETE - 3+ VIEW COMPARISON:  04/21/2017 FINDINGS: There is no evidence of fracture or dislocation. There is no evidence of arthropathy or other focal bone abnormality. Soft tissues are unremarkable. IMPRESSION: No acute fracture or malalignment. Electronically Signed   By: Ruthann Cancer M.D.   On: 06/25/2021 12:14  ? ? ?Assessment & Plan:  ? ?Problem List Items Addressed This Visit   ? ? Anxiety  ?  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 She is "handling" her mother's outbursts and manipulation much better.  Continue sertraline  ? ?  ?  ?  B12 deficiency  ?  recheching level today  With MMA and IF ab ? ?  ?  ? Relevant Orders  ? Intrinsic Factor Antibodies  ? Methylmalonic Acid  ? Obesity, diabetes, and hypertension syndrome ([place]) - Primary  ?  Improved control with ozempic effecting significant weight loss.  Continue current dose; increase metformin to 750 mg xr.  Eye exam due ? ?Lab Results  ?Component Value Date  ? HGBA1C 6.0 07/28/2021  ? ?Lab Results  ?Component Value Date  ? MICROALBUR <0.7 04/24/2021  ? MICROALBUR 1.4 10/04/2018  ? ? ? ? ?  ?  ? Relevant Medications  ? metFORMIN (GLUCOPHAGE-XR) 750 MG 24 hr tablet  ? Semaglutide,0.25 or 0.5MG/DOS, (OZEMPIC, 0.25 OR 0.5 MG/DOSE,) 2 MG/1.5ML SOPN  ? Other Relevant Orders  ? HgB A1c  ? Lipid Profile  ? Direct LDL  ? Comp Met (CMET)  ? ?Other Visit Diagnoses   ? ? Fatigue, unspecified type      ? Relevant Orders  ? B12 and Folate Panel  ? TSH  ? ?  ? ? ?I spent a total of  30 minutes with this patient in a face to face visit on the date of this encounter reviewing the last office visit with me, providing counselling on her managing her mother's borderline personality,  her success in weight management using Ozempic ,  patient's diet and eating habits, home blood pressure readings ,  and post visit ordering of testing and  therapeutics.   ? ?Follow-up: Return in about 3 months (around 03/02/2022). ? ? ?Crecencio Mc, MD ?

## 2021-11-30 NOTE — Assessment & Plan Note (Signed)
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 She is "handling" her mother's outbursts and manipulation much better.  Continue sertraline  ?

## 2021-12-01 LAB — HM DIABETES EYE EXAM

## 2021-12-08 LAB — METHYLMALONIC ACID, SERUM: Methylmalonic Acid, Quant: 133 nmol/L (ref 87–318)

## 2021-12-08 LAB — INTRINSIC FACTOR ANTIBODIES: Intrinsic Factor: NEGATIVE

## 2021-12-31 ENCOUNTER — Ambulatory Visit (INDEPENDENT_AMBULATORY_CARE_PROVIDER_SITE_OTHER): Payer: BC Managed Care – PPO

## 2021-12-31 ENCOUNTER — Ambulatory Visit
Admission: RE | Admit: 2021-12-31 | Discharge: 2021-12-31 | Disposition: A | Discharge status: Home / Self Care | Payer: BC Managed Care – PPO | Source: Ambulatory Visit | Attending: Family Medicine | Admitting: Family Medicine

## 2021-12-31 VITALS — BP 122/84 | HR 74 | Temp 98.6°F | Resp 18

## 2021-12-31 DIAGNOSIS — M79671 Pain in right foot: Secondary | ICD-10-CM

## 2021-12-31 DIAGNOSIS — M79661 Pain in right lower leg: Secondary | ICD-10-CM

## 2021-12-31 MED ORDER — TIZANIDINE HCL 4 MG PO TABS: 4.0000 mg | ORAL_TABLET | Freq: Every evening | ORAL | 0 refills | Status: DC | PRN

## 2021-12-31 MED ORDER — KETOROLAC TROMETHAMINE 10 MG PO TABS: 10.0000 mg | ORAL_TABLET | Freq: Two times a day (BID) | ORAL | 0 refills | Status: DC | PRN

## 2021-12-31 MED ORDER — INDOMETHACIN 50 MG PO CAPS: 50.0000 mg | ORAL_CAPSULE | Freq: Two times a day (BID) | ORAL | 0 refills | Status: AC

## 2021-12-31 NOTE — ED Provider Notes (Addendum)
[facility]    CSN: [id number] Arrival date & time: 12/31/21  0849      History   Chief Complaint Chief Complaint  Patient presents with   Foot Pain    Entered by patient   Leg Pain    HPI Mary Bryant is a 56 y.o. female.   HPI Patient with history of DM2 (controlled-5.7 A1C) presents today with a 5-day history of right calf pain which radiates down to her right dorsum of the foot mainly affecting the fourth and fifth metatarsals.  She has some prominence of swelling on the fifth metatarsal joint.  She denies any known mechanism of injury.  Patient does have a history of diabetes although no diagnosis of neuropathy.  She endorses the pain is sharp, shooting into her foot at nighttime. The pain is reproducible with touching.  She reports having a small boil on the back of her calf approximately 5 days ago however the next morning the nodule had completely resolved and she had no swelling, bruising involving the right calf however she continues to have tenderness radiating down the calf along the fibular region of the leg.  Past Medical History:  Diagnosis Date   Dental bridge present    No bridge.  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   PONV (postoperative nausea and vomiting)    rhinitis allergic    Trigeminal neuralgia    Wears contact lenses    Wears hearing aid    bilateral    Patient Active Problem List   Diagnosis Date Noted   Suspected COVID-19 virus infection 08/30/2020   Stricture and stenosis of esophagus    Polyp of sigmoid colon    Chronic fatigue 01/03/2018   B12 deficiency 01/03/2018   Obesity, diabetes, and hypertension syndrome ([place]) 10/01/2017   Chronic migraine 05/16/2017   Hospital  discharge follow-up 05/30/2016   Encounter for preventive health examination 05/10/2015   Menopausal hot flushes 05/10/2015   S/P abdominal hysterectomy 05/08/2015   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/2015   Long-term use of high-risk medication 04/21/2014   Trigeminal neuralgia of left side of face 03/22/2014   Chronic headaches 11/21/2013   Generalized anxiety disorder with panic attacks 10/25/2013   Hiatal hernia with GERD and esophagitis 10/25/2013   H pylori ulcer 10/25/2013   Anxiety 10/25/2013   History of breast lump/mass excision 08/08/2013   Vitamin D deficiency 06/10/2013   Generalized anxiety disorder 06/08/2013   Screening for breast cancer 06/08/2013   CAD in native artery 06/08/2013   H/O left bundle branch block 06/08/2013   Chest pain 04/04/2013   Essential hypertension    Insomnia     Past Surgical History:  Procedure Laterality Date   ABDOMINAL HYSTERECTOMY  2000   no history of ca    CHOLECYSTECTOMY  2008   COLONOSCOPY WITH PROPOFOL N/A 05/11/2019   Procedure: COLONOSCOPY WITH PROPOFOL;  Surgeon: Lucilla Lame, MD;  Location: [facility];  Service: Endoscopy;  Laterality: N/A;   ESOPHAGEAL DILATION  05/11/2019   Procedure: ESOPHAGEAL DILATION;  Surgeon: Lucilla Lame, MD;  Location: [facility];  Service: Endoscopy;;  ESOPHAGOGASTRODUODENOSCOPY (EGD) WITH PROPOFOL N/A 05/12/2016   Procedure: ESOPHAGOGASTRODUODENOSCOPY (EGD) WITH PROPOFOL;  Surgeon: Jonathon Bellows, MD;  Location: [facility];  Service: Endoscopy;  Laterality: N/A;   ESOPHAGOGASTRODUODENOSCOPY (EGD) WITH PROPOFOL N/A 05/11/2019   Procedure: ESOPHAGOGASTRODUODENOSCOPY (EGD) WITH PROPOFOL;  Surgeon: Lucilla Lame, MD;  Location: [place];  Service: Endoscopy;  Laterality: N/A;  Diabetic - oral meds   POLYPECTOMY  05/11/2019   Procedure: POLYPECTOMY;  Surgeon: Lucilla Lame, MD;  Location: [facility];  Service: Endoscopy;;     OB History     Gravida  3   Para  2   Term      Preterm      AB  1   Living  2      SAB  1   IAB      Ectopic      Multiple      Live Births           Obstetric Comments  1st Menstrual Cycle:  15 1st Pregnancy:  24          Home Medications    Prior to Admission medications   Medication Sig Start Date End Date Taking? Authorizing Provider  acetaminophen (TYLENOL) 500 MG tablet Take 1,000 mg by mouth every 6 (six) hours as needed for mild pain.    [provider]  acyclovir (ZOVIRAX) 400 MG tablet Take 1 tablet (400 mg total) by mouth 5 (five) times daily. For one week  As needed for blisters 04/16/20   Crecencio Mc, MD  ALPRAZolam Duanne Moron) 0.25 MG tablet Take 1 tablet (0.25 mg total) by mouth daily as needed for anxiety. 04/24/21   Crecencio Mc, MD  betamethasone valerate (VALISONE) 0.1 % cream Apply topically 2 (two) times daily. 04/16/20   Crecencio Mc, MD  cyanocobalamin (,VITAMIN B-12,) 1000 MCG/ML injection INJECT 1 ML (1,000 MCG TOTAL) INTO THE MUSCLE EVERY 30 DAYS. 09/14/21   Crecencio Mc, MD  hydrOXYzine (VISTARIL) 25 MG capsule Take 1 capsule (25 mg total) by mouth every 8 (eight) hours as needed for itching or anxiety. 04/24/21   Crecencio Mc, MD  metFORMIN (GLUCOPHAGE-XR) 750 MG 24 hr tablet Take 1 tablet (750 mg total) by mouth daily with breakfast. 11/30/21   Crecencio Mc, MD  metoprolol succinate (TOPROL-XL) 50 MG 24 hr tablet TAKE 1 TABLET BY MOUTH DAILY. TAKE WITH OR IMMEDIATELY FOLLOWING A MEAL. 10/26/21   Crecencio Mc, MD  ondansetron (ZOFRAN-ODT) 4 MG disintegrating tablet TAKE 1 TABLET BY MOUTH EVERY 8 HOURS AS NEEDED FOR NAUSEA AND VOMITING 09/04/21   Crecencio Mc, MD  OZEMPIC, 2 MG/DOSE, 8 MG/3ML SOPN INJECT 2 MG AS DIRECTED ONCE A WEEK. 11/30/21   Crecencio Mc, MD  pantoprazole (PROTONIX) 40 MG tablet TAKE 1 TABLET BY MOUTH EVERY DAY 10/26/21   Crecencio Mc, MD  Semaglutide,0.25 or 0.'5MG'$ /DOS, (OZEMPIC, 0.25  OR 0.5 MG/DOSE,) 2 MG/1.5ML SOPN Inject 0.5 mg into the skin once a week. 11/30/21   Crecencio Mc, MD  sertraline (ZOLOFT) 50 MG tablet TAKE 1 TABLET BY MOUTH EVERY DAY 05/18/21   Crecencio Mc, MD  silver sulfADIAZINE (SILVADENE) 1 % cream Apply 1 application topically daily. 03/05/21   Crecencio Mc, MD  SODIUM FLUORIDE 5000 SENSITIVE 1.1-5 % PSTE  10/09/19   [provider]  Syringe/Needle, Disp, (SYRINGE 3CC/25GX1") 25G X 1" 3 ML MISC Use for b12 injections 01/04/18   Crecencio Mc, MD  traZODone (DESYREL) 50 MG tablet TAKE 0.5-1 TABLETS BY MOUTH AT BEDTIME AS NEEDED FOR SLEEP. 06/17/21   Crecencio Mc, MD    Family History Family History  Problem Relation Age of Onset   Cancer Father        Bladder,  in Hospice   Diabetes type II Father    COPD Father    Coronary artery disease Father    Heart disease Father 25   Diabetes Mother    Hyperlipidemia Mother    Hypertension Mother    Early death Paternal Grandmother    Heart disease Paternal Grandmother        CAD   Prostate cancer Paternal Alanda Slim' disease Daughter     Social History Social History   Tobacco Use   Smoking status: Never   Smokeless tobacco: Never  Vaping Use   Vaping Use: Never used  Substance Use Topics   Alcohol use: No   Drug use: No     Allergies   Promethazine, Benadryl [diphenhydramine hcl], and Phenothiazines   Review of Systems Review of Systems Pertinent negatives listed in HPI  Physical Exam Triage Vital Signs ED Triage Vitals  Enc Vitals Group     BP 12/31/21 0907 122/84     Pulse Rate 12/31/21 0907 74     Resp 12/31/21 0907 18     Temp 12/31/21 0907 98.6 F (37 C)     Temp Source 12/31/21 0907 Oral     SpO2 12/31/21 0907 96 %     Weight --      Height --      Head Circumference --      Peak Flow --      Pain Score 12/31/21 0906 6     Pain Loc --      Pain Edu? --      Excl. in [place]? --    No data found.  Updated Vital Signs BP 122/84 (BP  Location: Left Arm)   Pulse 74   Temp 98.6 F (37 C) (Oral)   Resp 18   SpO2 96%   Visual Acuity Right Eye Distance:   Left Eye Distance:   Bilateral Distance:    Right Eye Near:   Left Eye Near:    Bilateral Near:     Physical Exam Vitals reviewed.  Constitutional:      Appearance: Normal appearance.  Eyes:     Extraocular Movements: Extraocular movements intact.     Pupils: Pupils are equal, round, and reactive to light.  Cardiovascular:     Rate and Rhythm: Normal rate and regular rhythm.  Pulmonary:     Effort: Pulmonary effort is normal.     Breath sounds: Normal breath sounds.  Musculoskeletal:     Cervical back: Normal range of motion.     Right lower leg: Tenderness present. No swelling. No edema.     Left lower leg: Normal. No swelling.     Right ankle: No swelling.     Right Achilles Tendon: No tenderness.     Left ankle: No swelling.     Left Achilles Tendon: No tenderness.     Right foot: Tenderness present.     Left foot: Normal.       Legs:     Comments: Prominent swelling of the fifth metatarsal joint along with palpable tenderness at the base of fourth and fifth toe. No ecchymosis.  Full range of motion intact.  Neurological:     Mental  Status: She is alert.      UC Treatments / Results  Labs (all labs ordered are listed, but only abnormal results are displayed) Labs Reviewed - No data to display  EKG   Radiology DG Tibia/Fibula Right  Result Date: 12/31/2021 CLINICAL DATA:  Pain EXAM: RIGHT TIBIA AND FIBULA - 2 VIEW COMPARISON:  None Available. FINDINGS: There is no evidence of fracture or other focal bone lesions. Soft tissues are unremarkable. IMPRESSION: No radiographic abnormality is seen in the right tibia and fibula. Electronically Signed   By: Elmer Picker M.D.   On: 12/31/2021 10:20   DG Foot Complete Right  Result Date: 12/31/2021 CLINICAL DATA:  Pain EXAM: RIGHT FOOT COMPLETE - 3+ VIEW COMPARISON:  None Available.  FINDINGS: There is no evidence of fracture or dislocation. There is no evidence of arthropathy or other focal bone abnormality. Soft tissues are unremarkable. IMPRESSION: No radiographic abnormality is seen in the right foot. Electronically Signed   By: Elmer Picker M.D.   On: 12/31/2021 10:20    Procedures Procedures (including critical care time)  Medications Ordered in UC Medications - No data to display  Initial Impression / Assessment and Plan / UC Course  I have reviewed the triage vital signs and the nursing notes.  Pertinent labs & imaging results that were available during my care of the patient were reviewed by me and considered in my medical decision making (see chart for details).    Right calf and foot pain Suspect symptoms are more than likely neuropathic given the character and quality of pain. Imaging performed today was negative for  Will trial tizanidine at bedtime to help with neuropathic pain and Toradol for acute control of inflammation given patient is a diabetic will defer use of prednisone.  Information given to follow-up with [facility] for further work-up and evaluation of symptoms or patient can follow-up with primary care doctor.   12/31/2021 Received a call from pharmacy, per new guidelines patient must receive an IM injection of Toradol prior to taking oral for 5 days. Changed prescription to Indomethacin bid x 5 days. /Final Clinical Impressions(s) / UC Diagnoses   Final diagnoses:  Right calf pain  Right foot pain   Discharge Instructions   None    ED Prescriptions     Medication Sig Dispense Auth. Provider   ketorolac (TORADOL) 10 MG tablet Take 1 tablet (10 mg total) by mouth 2 (two) times daily as needed for moderate pain (foot and leg pain). 14 tablet Scot Jun, FNP   tiZANidine (ZANAFLEX) 4 MG tablet Take 1 tablet (4 mg total) by mouth at bedtime as needed for muscle spasms. 20 tablet Scot Jun, FNP      PDMP not  reviewed this encounter.   Scot Jun, FNP 12/31/21 [place], [place], [place] 12/31/21 1055

## 2021-12-31 NOTE — ED Triage Notes (Signed)
Pt states 5 days ago a cyst developed on the calf of her right leg and went away the next today. She started to develop pain at her right calf that radiates down to the top of her right foot. It feels like needles are sticking her.

## 2021-12-31 NOTE — Discharge Instructions (Addendum)
X-rays of your lower leg and foot are both negative for any acute fracture and does not reveal obvious cause of your symptoms.  I have prescribed you a short course of prescription strength anti-inflammatories called Toradol take twice daily as needed for foot and leg pain.  For nighttime foot pain I have prescribed you tizanidine he can take 4 mg once daily 1 hour prior to bedtime.  Do recommend further work-up of your symptoms either with your primary care doctor or follow-up with [facility]

## 2022-01-02 ENCOUNTER — Other Ambulatory Visit: Payer: Self-pay | Admitting: Internal Medicine

## 2022-01-28 DIAGNOSIS — M778 Other enthesopathies, not elsewhere classified: Secondary | ICD-10-CM | POA: Diagnosis not present

## 2022-01-28 DIAGNOSIS — G5761 Lesion of plantar nerve, right lower limb: Secondary | ICD-10-CM | POA: Diagnosis not present

## 2022-01-28 DIAGNOSIS — E119 Type 2 diabetes mellitus without complications: Secondary | ICD-10-CM | POA: Diagnosis not present

## 2022-02-09 ENCOUNTER — Encounter: Payer: Self-pay | Admitting: Internal Medicine

## 2022-02-09 DIAGNOSIS — Z6825 Body mass index (BMI) 25.0-25.9, adult: Secondary | ICD-10-CM | POA: Diagnosis not present

## 2022-02-09 DIAGNOSIS — Z20822 Contact with and (suspected) exposure to covid-19: Secondary | ICD-10-CM | POA: Diagnosis not present

## 2022-02-09 DIAGNOSIS — U071 COVID-19: Secondary | ICD-10-CM | POA: Diagnosis not present

## 2022-02-09 NOTE — Telephone Encounter (Signed)
Pt scheduled for VV tomorrow with Dr. Volanda Napoleon at 30 in hopes of starting antiviral. Patient stated that she feels terrible, she is congested & hoarse. She is afraid that this may go into pneumonia like last time when she had Covid before Covid was even known. Pt is not SOB however so I felt appropriate that she have VV tomorrow & would still be in time window since sx started yesterday for antiviral.

## 2022-02-10 ENCOUNTER — Encounter: Payer: Self-pay | Admitting: Family Medicine

## 2022-02-10 ENCOUNTER — Telehealth (INDEPENDENT_AMBULATORY_CARE_PROVIDER_SITE_OTHER): Payer: BC Managed Care – PPO | Admitting: Family Medicine

## 2022-02-10 DIAGNOSIS — U071 COVID-19: Secondary | ICD-10-CM | POA: Diagnosis not present

## 2022-02-10 MED ORDER — NIRMATRELVIR/RITONAVIR (PAXLOVID)TABLET: 3.0000 | ORAL_TABLET | Freq: Two times a day (BID) | ORAL | 0 refills | Status: AC

## 2022-02-10 NOTE — Assessment & Plan Note (Signed)
[facility] test and [facility] positive for COVID.  Symptoms started 2 days ago, include cough, fever, rhinorrhea, posttussive rib pain.  GFR >60 -Paxlovid x5 days -Strict return precautions provided

## 2022-02-10 NOTE — Progress Notes (Signed)
[facility] Telemedicine Visit  Patient consented to have virtual visit and was identified by name and date of birth. Method of visit: Video  Encounter participants: Patient: Mary Bryant - located at home Provider: Carollee Leitz - located at office Others (if applicable): none  Chief Complaint: positive COVID test  HPI:  Was at convention last week.  Noticed symptoms started 2 days ago.  Cough, runny nose, backache, elevated temperature 101-102.  Endorses pain in rib area from coughing.  Taking Tylenol alternating with ibuprofen for relief.  Denies any shortness of breath, chest pain.  Took home COVID test Monday night negative.  Repeat home test on Tuesday negative.  Continue to have symptoms decided [facility] which was [facility].  She would like to start Paxlovid help decrease symptoms.  Received COVID vaccines x3 and booster  ROS: per HPI  Pertinent PMHx:  None  Exam:  There were no vitals taken for this visit.  Respiratory: Speaking in full sentences, no increased work of breathing.  Intermittent cough.  Assessment/Plan:  Positive self-administered antigen test for COVID-19 Home test and [facility] test positive for COVID.  Symptoms started 2 days ago, include cough, fever, rhinorrhea, posttussive rib pain.  GFR >60 -Paxlovid x5 days -Strict return precautions provided    Time spent during visit with patient: 15 minutes

## 2022-02-10 NOTE — Patient Instructions (Signed)
It was a pleasure meeting you today. Thank you for allowing me to take part in your health care.  Our goals for today as we discussed include:  For your symptoms are COVID Take Paxlovid for 5 days.  This will help to decrease the symptom severity. If your symptoms worsen, shortness of breath, chest pain, difficulty breathing please go to the emergency department as soon as possible.  Recommend Shingles vaccine.  This is a 2 dose series and can be given at your local pharmacy.  Please talk to your pharmacist about this.   Please follow-up with PCP as needed  If you have any questions or concerns, please do not hesitate to call the office at [phone].  I look forward to our next visit and until then take care and stay safe.  Regards,   Carollee Leitz, MD   [facility]

## 2022-02-11 ENCOUNTER — Encounter: Payer: Self-pay | Admitting: Internal Medicine

## 2022-02-11 ENCOUNTER — Telehealth: Payer: Self-pay

## 2022-02-11 NOTE — Telephone Encounter (Signed)
Patient states she was seen yesterday by Dr. Carollee Leitz via a virtual visit.  Patient states she tested positive for Covid on 02/09/2022.  Patient states she would like for [place] to send her a note for work stating she will be out of work starting 02/09/2022 and returning on 02/15/2022.  Patient states she would like for the note to be sent to her via [place].

## 2022-02-12 ENCOUNTER — Encounter: Payer: Self-pay | Admitting: Internal Medicine

## 2022-02-16 ENCOUNTER — Other Ambulatory Visit: Payer: Self-pay | Admitting: Internal Medicine

## 2022-04-27 ENCOUNTER — Other Ambulatory Visit: Payer: Self-pay | Admitting: Internal Medicine

## 2022-05-22 DIAGNOSIS — M546 Pain in thoracic spine: Secondary | ICD-10-CM | POA: Diagnosis not present

## 2022-05-23 ENCOUNTER — Ambulatory Visit
Admission: EM | Admit: 2022-05-23 | Discharge: 2022-05-23 | Disposition: A | Discharge status: Home / Self Care | Payer: BC Managed Care – PPO | Attending: Family Medicine | Admitting: Family Medicine

## 2022-05-23 ENCOUNTER — Other Ambulatory Visit: Payer: Self-pay

## 2022-05-23 ENCOUNTER — Emergency Department
Admission: EM | Admit: 2022-05-23 | Discharge: 2022-05-23 | Disposition: A | Discharge status: Home / Self Care | Payer: BC Managed Care – PPO | Attending: Emergency Medicine | Admitting: Emergency Medicine

## 2022-05-23 ENCOUNTER — Emergency Department: Payer: BC Managed Care – PPO

## 2022-05-23 ENCOUNTER — Encounter: Payer: Self-pay | Admitting: Emergency Medicine

## 2022-05-23 DIAGNOSIS — Z79899 Other long term (current) drug therapy: Secondary | ICD-10-CM | POA: Diagnosis not present

## 2022-05-23 DIAGNOSIS — M546 Pain in thoracic spine: Secondary | ICD-10-CM | POA: Diagnosis not present

## 2022-05-23 DIAGNOSIS — I10 Essential (primary) hypertension: Secondary | ICD-10-CM | POA: Insufficient documentation

## 2022-05-23 DIAGNOSIS — I2510 Atherosclerotic heart disease of native coronary artery without angina pectoris: Secondary | ICD-10-CM | POA: Insufficient documentation

## 2022-05-23 DIAGNOSIS — R110 Nausea: Secondary | ICD-10-CM | POA: Diagnosis not present

## 2022-05-23 DIAGNOSIS — R079 Chest pain, unspecified: Secondary | ICD-10-CM | POA: Diagnosis not present

## 2022-05-23 DIAGNOSIS — R0789 Other chest pain: Secondary | ICD-10-CM | POA: Diagnosis not present

## 2022-05-23 DIAGNOSIS — I447 Left bundle-branch block, unspecified: Secondary | ICD-10-CM | POA: Diagnosis not present

## 2022-05-23 DIAGNOSIS — M549 Dorsalgia, unspecified: Secondary | ICD-10-CM

## 2022-05-23 LAB — HEPATIC FUNCTION PANEL
ALT: 14 U/L (ref 0–44)
AST: 20 U/L (ref 15–41)
Albumin: 3.8 g/dL (ref 3.5–5.0)
Alkaline Phosphatase: 56 U/L (ref 38–126)
Bilirubin, Direct: <0.1 mg/dL (ref 0.0–0.2)
Total Bilirubin: 0.8 mg/dL (ref 0.3–1.2)
Total Protein: 7.7 g/dL (ref 6.5–8.1)

## 2022-05-23 LAB — BASIC METABOLIC PANEL
Anion gap: 8 (ref 5–15)
BUN: 7 mg/dL (ref 6–20)
CO2: 25 mmol/L (ref 22–32)
Calcium: 9.3 mg/dL (ref 8.9–10.3)
Chloride: 107 mmol/L (ref 98–111)
Creatinine, Ser: 1.01 mg/dL — ABNORMAL HIGH (ref 0.44–1.00)
GFR, Estimated: >60 mL/min (ref >60)
Glucose, Bld: 103 mg/dL — ABNORMAL HIGH (ref 70–99)
Potassium: 4.6 mmol/L (ref 3.5–5.1)
Sodium: 140 mmol/L (ref 135–145)

## 2022-05-23 LAB — TROPONIN I (HIGH SENSITIVITY)
Troponin I (High Sensitivity): <2 ng/L (ref <18)
Troponin I (High Sensitivity): 3 ng/L (ref <18)

## 2022-05-23 LAB — CBC
HCT: 40.6 % (ref 36.0–46.0)
Hemoglobin: 13.2 g/dL (ref 12.0–15.0)
MCH: 28.1 pg (ref 26.0–34.0)
MCHC: 32.5 g/dL (ref 30.0–36.0)
MCV: 86.6 fL (ref 80.0–100.0)
Platelets: 411 10*3/uL — ABNORMAL HIGH (ref 150–400)
RBC: 4.69 MIL/uL (ref 3.87–5.11)
RDW: 14.1 % (ref 11.5–15.5)
WBC: 10.1 10*3/uL (ref 4.0–10.5)
nRBC: 0 % (ref 0.0–0.2)

## 2022-05-23 LAB — D-DIMER, QUANTITATIVE: D-Dimer, Quant: 0.35 ug/mL-FEU (ref 0.00–0.50)

## 2022-05-23 LAB — LIPASE, BLOOD: Lipase: 59 U/L — ABNORMAL HIGH (ref 11–51)

## 2022-05-23 MED ORDER — TRAMADOL HCL 50 MG PO TABS: 50.0000 mg | ORAL_TABLET | Freq: Three times a day (TID) | ORAL | 0 refills | Status: AC | PRN

## 2022-05-23 MED ORDER — ASPIRIN 81 MG PO CHEW
162.0000 mg | CHEWABLE_TABLET | Freq: Once | ORAL | Status: AC
  Administered 2022-05-23: 162 mg via ORAL

## 2022-05-23 MED ORDER — ASPIRIN 81 MG PO CHEW: 324.0000 mg | CHEWABLE_TABLET | Freq: Once | ORAL | Status: DC

## 2022-05-23 MED ORDER — NITROGLYCERIN 0.4 MG SL SUBL: 0.4000 mg | SUBLINGUAL_TABLET | SUBLINGUAL | 0 refills | Status: DC | PRN

## 2022-05-23 MED ORDER — CYCLOBENZAPRINE HCL 10 MG PO TABS
10.0000 mg | ORAL_TABLET | Freq: Once | ORAL | Status: AC
  Administered 2022-05-23: 10 mg via ORAL
  Filled 2022-05-23: qty 1

## 2022-05-23 MED ORDER — TRAMADOL HCL 50 MG PO TABS
50.0000 mg | ORAL_TABLET | Freq: Once | ORAL | Status: AC
  Administered 2022-05-23: 50 mg via ORAL
  Filled 2022-05-23: qty 1

## 2022-05-23 MED ORDER — CYCLOBENZAPRINE HCL 5 MG PO TABS: 5.0000 mg | ORAL_TABLET | Freq: Three times a day (TID) | ORAL | 0 refills | Status: DC | PRN

## 2022-05-23 NOTE — ED Triage Notes (Signed)
Pt to ED from [place] for squeezing, sharp pain under L breast sharp to L back. Pain there since yesterday, worse since this morning. Denies SOB. Endorses on and off dizziness since yesterday.  EMS gave 2 SL NTG, '324mg'$  aspirin.. 30# R AC. LBBB on ems EKG.   Pt states went to [facility] yesterday because thought this was back pain. Was given Robaxin.   EKG shown to EDP.

## 2022-05-23 NOTE — Discharge Instructions (Signed)
You were seen today for back pain and chest pain.  Your chest x-ray did not show any evidence of pneumonia.  Your EKG was unchanged from 2018.  Your heart enzymes were completely normal.  Your other lab work-up was reassuring.  This is likely muscular in origin.  I am putting you on muscle relaxers and pain medication to take every 8 hours as needed.  I am sending in some nitro to take just in case you experience severe chest pain again.  Please call your PCP and cardiologist for follow-up tomorrow.

## 2022-05-23 NOTE — ED Provider Notes (Signed)
[facility]    CSN: [id number] Arrival date & time: 05/23/22  1342      History   Chief Complaint Chief Complaint  Patient presents with   Back Pain    HPI Mary Bryant is a 56 y.o. female.   HPI  Mary Bryant presents for back pain. She reached back to touch the light. She did not pass. "Feels like I was compressed."   She used a heating pad and ibuprofen without relief.  She went to [facility] and took an Insurance account manager. She has been taking Robaxin and Tramadol without relief.  She was told to be seen again for EKG, if she did not feel any better.   She can walk, talk and move her arms and legs. She continues to have subxiphoid/subcoastal chest pain that radiates through her back. She has been nauseated and clammy.  No vomiting. Has left neck pain. No jaw pain. Saw a cardiologist (Dr Clayborn Bigness) for heart cath 10 years ago. Says no stents placed.    She is no longer menopausal. Doesn't feel like a hot flash.        Past Medical History:  Diagnosis Date   Dental bridge present    No bridge.  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   PONV (postoperative nausea and vomiting)    rhinitis allergic    Trigeminal neuralgia    Wears contact lenses    Wears hearing aid    bilateral    Patient Active Problem List   Diagnosis Date Noted   Positive self-administered antigen test for COVID-19 02/10/2022   Suspected COVID-19 virus infection 08/30/2020   Stricture and stenosis of esophagus    Polyp of sigmoid colon    Chronic fatigue 01/03/2018   B12 deficiency 01/03/2018   Obesity, diabetes, and hypertension syndrome ([place]) 10/01/2017   Chronic migraine 05/16/2017   Hospital discharge follow-up 05/30/2016   Encounter  for preventive health examination 05/10/2015   Menopausal hot flushes 05/10/2015   S/P abdominal hysterectomy 05/08/2015   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/2015   Long-term use of high-risk medication 04/21/2014   Trigeminal neuralgia of left side of face 03/22/2014   Chronic headaches 11/21/2013   Generalized anxiety disorder with panic attacks 10/25/2013   Hiatal hernia with GERD and esophagitis 10/25/2013   H pylori ulcer 10/25/2013   Anxiety 10/25/2013   History of breast lump/mass excision 08/08/2013   Vitamin D deficiency 06/10/2013   Generalized anxiety disorder 06/08/2013   Screening for breast cancer 06/08/2013   CAD in native artery 06/08/2013   H/O left bundle branch block 06/08/2013   Chest pain 04/04/2013   Essential hypertension    Insomnia     Past Surgical History:  Procedure Laterality Date   ABDOMINAL HYSTERECTOMY  07/19/1998   no history of ca    CARDIAC SURGERY     CHOLECYSTECTOMY  07/19/2006   COLONOSCOPY WITH PROPOFOL N/A 05/11/2019   Procedure: COLONOSCOPY WITH PROPOFOL;  Surgeon: Lucilla Lame, MD;  Location: [place];  Service: Endoscopy;  Laterality: N/A;   ESOPHAGEAL DILATION  05/11/2019   Procedure: ESOPHAGEAL DILATION;  Surgeon: Lucilla Lame, MD;  Location: [place];  Service: Endoscopy;;   ESOPHAGOGASTRODUODENOSCOPY (EGD) WITH PROPOFOL N/A 05/12/2016   Procedure: ESOPHAGOGASTRODUODENOSCOPY (EGD) WITH PROPOFOL;  Surgeon: Jonathon Bellows, MD;  Location: [place];  Service: Endoscopy;  Laterality: N/A;   ESOPHAGOGASTRODUODENOSCOPY (EGD) WITH PROPOFOL N/A 05/11/2019   Procedure: ESOPHAGOGASTRODUODENOSCOPY (EGD) WITH PROPOFOL;  Surgeon: Lucilla Lame, MD;  Location: [place];  Service: Endoscopy;  Laterality: N/A;  Diabetic - oral meds   POLYPECTOMY  05/11/2019   Procedure: POLYPECTOMY;  Surgeon: Lucilla Lame, MD;  Location: [place];  Service: Endoscopy;;    OB  History     Gravida  3   Para  2   Term      Preterm      AB  1   Living  2      SAB  1   IAB      Ectopic      Multiple      Live Births           Obstetric Comments  1st Menstrual Cycle:  15 1st Pregnancy:  24          Home Medications    Prior to Admission medications   Medication Sig Start Date End Date Taking? Authorizing Provider  cyanocobalamin (,VITAMIN B-12,) 1000 MCG/ML injection INJECT 1 ML (1,000 MCG TOTAL) INTO THE MUSCLE EVERY 30 DAYS. 09/14/21  Yes Crecencio Mc, MD  metFORMIN (GLUCOPHAGE-XR) 750 MG 24 hr tablet Take 1 tablet (750 mg total) by mouth daily with breakfast. 11/30/21  Yes Crecencio Mc, MD  methocarbamol (ROBAXIN) 500 MG tablet Take 2 tablets 3 times a day by oral route as needed for 10 days. 05/22/22  Yes [provider]  metoprolol succinate (TOPROL-XL) 50 MG 24 hr tablet TAKE 1 TABLET BY MOUTH EVERY DAY WITH OR IMMEDIATELY FOLLOWING A MEAL 04/27/22  Yes Crecencio Mc, MD  pantoprazole (PROTONIX) 40 MG tablet TAKE 1 TABLET BY MOUTH EVERY DAY 04/27/22  Yes Crecencio Mc, MD  Semaglutide,0.25 or 0.'5MG'$ /DOS, (OZEMPIC, 0.25 OR 0.5 MG/DOSE,) 2 MG/1.5ML SOPN Inject 0.5 mg into the skin once a week. 11/30/21  Yes Crecencio Mc, MD  sertraline (ZOLOFT) 50 MG tablet TAKE 1 TABLET BY MOUTH EVERY DAY 02/16/22  Yes Crecencio Mc, MD  traZODone (DESYREL) 50 MG tablet TAKE 1/2 TO 1 TABLET BY MOUTH AT BEDTIME AS NEEDED FOR SLEEP 04/27/22  Yes Crecencio Mc, MD  acetaminophen (TYLENOL) 500 MG tablet Take 1,000 mg by mouth every 6 (six) hours as needed for mild pain.    [provider]  acyclovir (ZOVIRAX) 400 MG tablet Take 1 tablet (400 mg total) by mouth 5 (five) times daily. For one week  As needed for blisters 04/16/20   Crecencio Mc, MD  ALPRAZolam Duanne Moron) 0.25 MG tablet Take 1 tablet (0.25 mg total) by mouth daily as needed for anxiety. 04/24/21   Crecencio Mc, MD  betamethasone valerate (VALISONE) 0.1 % cream Apply  topically 2 (two) times daily. 04/16/20   Crecencio Mc, MD  hydrOXYzine (VISTARIL) 25 MG capsule Take 1 capsule (25 mg total) by mouth every 8 (eight) hours as needed for itching or anxiety. 04/24/21   Crecencio Mc, MD  ondansetron (ZOFRAN-ODT) 4 MG disintegrating tablet TAKE 1 TABLET BY MOUTH EVERY 8 HOURS AS NEEDED FOR NAUSEA AND VOMITING 01/03/22   Dutch Quint B, FNP  OZEMPIC, 2 MG/DOSE, 8 MG/3ML SOPN INJECT 2 MG AS DIRECTED ONCE A WEEK. 11/30/21  Crecencio Mc, MD  silver sulfADIAZINE (SILVADENE) 1 % cream Apply 1 application topically daily. 03/05/21   Crecencio Mc, MD  SODIUM FLUORIDE 5000 SENSITIVE 1.1-5 % PSTE  10/09/19   [provider]  Syringe/Needle, Disp, (SYRINGE 3CC/25GX1") 25G X 1" 3 ML MISC Use for b12 injections 01/04/18   Crecencio Mc, MD  tiZANidine (ZANAFLEX) 4 MG tablet Take 1 tablet (4 mg total) by mouth at bedtime as needed for muscle spasms. 12/31/21   Scot Jun, FNP    Family History Family History  Problem Relation Age of Onset   Cancer Father        Bladder,  in Hospice   Diabetes type II Father    COPD Father    Coronary artery disease Father    Heart disease Father 60   Diabetes Mother    Hyperlipidemia Mother    Hypertension Mother    Early death Paternal Grandmother    Heart disease Paternal Grandmother        CAD   Prostate cancer Paternal Alanda Slim' disease Daughter     Social History Social History   Tobacco Use   Smoking status: Never   Smokeless tobacco: Never  Vaping Use   Vaping Use: Never used  Substance Use Topics   Alcohol use: No   Drug use: No     Allergies   Promethazine, Benadryl [diphenhydramine hcl], and Phenothiazines   Review of Systems Review of Systems : negative unless otherwise stated in HPI.      Physical Exam Triage Vital Signs ED Triage Vitals  Enc Vitals Group     BP 05/23/22 1405 (!) 127/93     Pulse Rate 05/23/22 1405 76     Resp 05/23/22 1405 14     Temp  05/23/22 1405 98.1 F (36.7 C)     Temp Source 05/23/22 1405 Oral     SpO2 05/23/22 1405 98 %     Weight 05/23/22 1402 175 lb 11.3 oz (79.7 kg)     Height 05/23/22 1402 '5\' 7"'$  (1.702 m)     Head Circumference --      Peak Flow --      Pain Score 05/23/22 1401 8     Pain Loc --      Pain Edu? --      Excl. in [place]? --    No data found.  Updated Vital Signs BP (!) 127/93 (BP Location: Left Arm)   Pulse 76   Temp 98.1 F (36.7 C) (Oral)   Resp 14   Ht '5\' 7"'$  (1.702 m)   Wt 79.7 kg   SpO2 98%   BMI 27.52 kg/m   Visual Acuity Right Eye Distance:   Left Eye Distance:   Bilateral Distance:    Right Eye Near:   Left Eye Near:    Bilateral Near:     Physical Exam  GEN: alert, female, seated on exam table, appear uncomfortable EYES:   pupils equal and reactive RESP:  clear to auscultation bilaterally, no increased work of breathing   CVS:   regular rate and rhythm, no murmur, distal pulses intact, no rubs or gallops  EXT:    atraumatic, no appreciable LE edema  NEURO:  normal without focal findings,  speech normal, alert and oriented   Skin:   warm and dry, no rash, normal skin turgor Psych: anxious, appropriate speech and behavior    UC Treatments / Results  Labs (all labs ordered  are listed, but only abnormal results are displayed) Labs Reviewed - No data to display  EKG   Radiology No results found.  Procedures Procedures (including critical care time)  Medications Ordered in UC Medications  aspirin chewable tablet 162 mg (162 mg Oral Given 05/23/22 1431)    Initial Impression / Assessment and Plan / UC Course  I have reviewed the triage vital signs and the nursing notes.  Pertinent labs & imaging results that were available during my care of the patient were reviewed by me and considered in my medical decision making (see chart for details).     Patient is a 56 y.o. year old female who presents with CAD, pre-diabetes, HTN, GAD who presents for her  subxiphoid chest pressure radiating to her back.  EKG without acute ST or T wave changes, LBBB not new when compared to EKG May 12, 2017. She had a previous stress study, which did not reveal evidence for ischemia. ECHO from Sept 2014 with EF 60-65% with dilated right atrium and thickened MV leaflets.   Daughter reports her mom has been nauseated and clammy.  She is postmenopausal but no longer has hot flashes.  She was seen by Ortho recently and was told her back xray was normal and to get an EKG if her pain continues.  Patient with elevated heart score.  Unable to adequately evaluate patient here in the urgent care.  Recommended ED evaluation to rule out MI and possibly get advanced spinal imaging.   She took 2 baby aspirin prior to arrival. She was given 2 additional baby aspirin here.   EMS called and updated fire department and EMS upon arrival. They will transport the patient to [facility] for additional evaluation.   Discussed MDM, treatment plan and plan for follow-up with patient who agrees with plan. Differential as below:     Final Clinical Impressions(s) / UC Diagnoses   Final diagnoses:  Chest pain, unspecified type     Discharge Instructions      You will be transported to the emergency department via EMS.  I am concerned that you have something going on with your heart.  You will need additional evaluation that is not available here in the urgent care.     ED Prescriptions   None    PDMP not reviewed this encounter.   Lyndee Hensen, DO 05/23/22 1503

## 2022-05-23 NOTE — ED Notes (Signed)
Patient states that she already took 2 tablets of '81mg'$  Aspirin before coming here.

## 2022-05-23 NOTE — ED Provider Notes (Signed)
[place] EMERGENCY DEPARTMENT Provider Note   CSN: [id number] Arrival date & time: 05/23/22  1506     History  Chief Complaint  Patient presents with   Chest Pain    Mary Bryant is a 56 y.o. female with a history of HTN, CAD, anxiety presents to the ER today with complaint of substernal chest pain that radiates into her back.  She reports this started yesterday but seemed worst today.  She describes the pain as sharp and stabbing.  The pain starts underneath her left shoulder blade and radiates to underneath her left breast.  She reports associated nausea but denies vomiting, cough, shortness of breath.  She was evaluated at orthopedics yesterday.  She was told the x-ray of her spine was normal, and treated her with a muscle relaxer which she reports did not provide any relief.  She went to urgent care earlier today for the same.  ECG at that time did not show any ST elevation or concern for ACS.  She was given Aspirin and transferred to the ER via EMS.  She was given nitro in route by EMS and reports improvement in her symptoms.  She has a history of GERD with PUD, hiatal hernia and esophageal stenosis.  She reports this pain feels completely different.       Home Medications Prior to Admission medications   Medication Sig Start Date End Date Taking? Authorizing Provider  cyclobenzaprine (FLEXERIL) 5 MG tablet Take 1 tablet (5 mg total) by mouth 3 (three) times daily as needed for muscle spasms. 05/23/22  Yes Kyli Sorter, Coralie Keens, NP  nitroGLYCERIN (NITROSTAT) 0.4 MG SL tablet Place 1 tablet (0.4 mg total) under the tongue every 5 (five) minutes as needed for chest pain. 05/23/22 05/23/23 Yes Charlisa Cham, Coralie Keens, NP  traMADol (ULTRAM) 50 MG tablet Take 1 tablet (50 mg total) by mouth every 8 (eight) hours as needed. 05/23/22 05/23/23 Yes Sameena Artus, Coralie Keens, NP  acetaminophen (TYLENOL) 500 MG tablet Take 1,000 mg by mouth every 6 (six) hours as needed for mild pain.     [provider]  acyclovir (ZOVIRAX) 400 MG tablet Take 1 tablet (400 mg total) by mouth 5 (five) times daily. For one week  As needed for blisters 04/16/20   Crecencio Mc, MD  ALPRAZolam Duanne Moron) 0.25 MG tablet Take 1 tablet (0.25 mg total) by mouth daily as needed for anxiety. 04/24/21   Crecencio Mc, MD  betamethasone valerate (VALISONE) 0.1 % cream Apply topically 2 (two) times daily. 04/16/20   Crecencio Mc, MD  cyanocobalamin (,VITAMIN B-12,) 1000 MCG/ML injection INJECT 1 ML (1,000 MCG TOTAL) INTO THE MUSCLE EVERY 30 DAYS. 09/14/21   Crecencio Mc, MD  hydrOXYzine (VISTARIL) 25 MG capsule Take 1 capsule (25 mg total) by mouth every 8 (eight) hours as needed for itching or anxiety. 04/24/21   Crecencio Mc, MD  metFORMIN (GLUCOPHAGE-XR) 750 MG 24 hr tablet Take 1 tablet (750 mg total) by mouth daily with breakfast. 11/30/21   Crecencio Mc, MD  metoprolol succinate (TOPROL-XL) 50 MG 24 hr tablet TAKE 1 TABLET BY MOUTH EVERY DAY WITH OR IMMEDIATELY FOLLOWING A MEAL 04/27/22   Crecencio Mc, MD  ondansetron (ZOFRAN-ODT) 4 MG disintegrating tablet TAKE 1 TABLET BY MOUTH EVERY 8 HOURS AS NEEDED FOR NAUSEA AND VOMITING 01/03/22   Dutch Quint B, FNP  OZEMPIC, 2 MG/DOSE, 8 MG/3ML SOPN INJECT 2 MG AS DIRECTED ONCE A WEEK. 11/30/21  Crecencio Mc, MD  pantoprazole (PROTONIX) 40 MG tablet TAKE 1 TABLET BY MOUTH EVERY DAY 04/27/22   Crecencio Mc, MD  Semaglutide,0.25 or 0.'5MG'$ /DOS, (OZEMPIC, 0.25 OR 0.5 MG/DOSE,) 2 MG/1.5ML SOPN Inject 0.5 mg into the skin once a week. 11/30/21   Crecencio Mc, MD  sertraline (ZOLOFT) 50 MG tablet TAKE 1 TABLET BY MOUTH EVERY DAY 02/16/22   Crecencio Mc, MD  silver sulfADIAZINE (SILVADENE) 1 % cream Apply 1 application topically daily. 03/05/21   Crecencio Mc, MD  SODIUM FLUORIDE 5000 SENSITIVE 1.1-5 % PSTE  10/09/19   [provider]  Syringe/Needle, Disp, (SYRINGE 3CC/25GX1") 25G X 1" 3 ML MISC Use for b12 injections 01/04/18   Crecencio Mc, MD  traZODone (DESYREL) 50 MG tablet TAKE 1/2 TO 1 TABLET BY MOUTH AT BEDTIME AS NEEDED FOR SLEEP 04/27/22   Crecencio Mc, MD      Allergies    Promethazine, Benadryl [diphenhydramine hcl], and Phenothiazines    Review of Systems   Review of Systems   Past Medical History:  Diagnosis Date   Dental bridge present    No bridge.  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   PONV (postoperative nausea and vomiting)    rhinitis allergic    Trigeminal neuralgia    Wears contact lenses    Wears hearing aid    bilateral    Current Facility-Administered Medications  Medication Dose Route Frequency Provider Last Rate Last Admin   cyclobenzaprine (FLEXERIL) tablet 10 mg  10 mg Oral Once Jaquilla Woodroof W, NP       traMADol Veatrice Bourbon) tablet 50 mg  50 mg Oral Once Jearld Fenton, NP       Current Outpatient Medications  Medication Sig Dispense Refill   cyclobenzaprine (FLEXERIL) 5 MG tablet Take 1 tablet (5 mg total) by mouth 3 (three) times daily as needed for muscle spasms. 15 tablet 0   nitroGLYCERIN (NITROSTAT) 0.4 MG SL tablet Place 1 tablet (0.4 mg total) under the tongue every 5 (five) minutes as needed for chest pain. 30 tablet 0   traMADol (ULTRAM) 50 MG tablet Take 1 tablet (50 mg total) by mouth every 8 (eight) hours as needed. 15 tablet 0   acetaminophen (TYLENOL) 500 MG tablet Take 1,000 mg by mouth every 6 (six) hours as needed for mild pain.     acyclovir (ZOVIRAX) 400 MG tablet Take 1 tablet (400 mg total) by mouth 5 (five) times daily. For one week  As needed for blisters 35 tablet 1   ALPRAZolam (XANAX) 0.25 MG tablet Take 1 tablet (0.25 mg total) by mouth daily as needed for anxiety. 30 tablet 5   betamethasone  valerate (VALISONE) 0.1 % cream Apply topically 2 (two) times daily. 30 g 0   cyanocobalamin (,VITAMIN B-12,) 1000 MCG/ML injection INJECT 1 ML (1,000 MCG TOTAL) INTO THE MUSCLE EVERY 30 DAYS. 10 mL 1   hydrOXYzine (VISTARIL) 25 MG capsule Take 1 capsule (25 mg total) by mouth every 8 (eight) hours as needed for itching or anxiety. 2 capsule 0   metFORMIN (GLUCOPHAGE-XR) 750 MG 24 hr tablet Take 1 tablet (750 mg  total) by mouth daily with breakfast. 90 tablet 1   metoprolol succinate (TOPROL-XL) 50 MG 24 hr tablet TAKE 1 TABLET BY MOUTH EVERY DAY WITH OR IMMEDIATELY FOLLOWING A MEAL 90 tablet 1   ondansetron (ZOFRAN-ODT) 4 MG disintegrating tablet TAKE 1 TABLET BY MOUTH EVERY 8 HOURS AS NEEDED FOR NAUSEA AND VOMITING 30 tablet 2   OZEMPIC, 2 MG/DOSE, 8 MG/3ML SOPN INJECT 2 MG AS DIRECTED ONCE A WEEK. 3 mL 1   pantoprazole (PROTONIX) 40 MG tablet TAKE 1 TABLET BY MOUTH EVERY DAY 90 tablet 1   Semaglutide,0.25 or 0.'5MG'$ /DOS, (OZEMPIC, 0.25 OR 0.5 MG/DOSE,) 2 MG/1.5ML SOPN Inject 0.5 mg into the skin once a week. 2 mL 1   sertraline (ZOLOFT) 50 MG tablet TAKE 1 TABLET BY MOUTH EVERY DAY 90 tablet 2   silver sulfADIAZINE (SILVADENE) 1 % cream Apply 1 application topically daily. 400 g 1   SODIUM FLUORIDE 5000 SENSITIVE 1.1-5 % PSTE      Syringe/Needle, Disp, (SYRINGE 3CC/25GX1") 25G X 1" 3 ML MISC Use for b12 injections 50 each 0   traZODone (DESYREL) 50 MG tablet TAKE 1/2 TO 1 TABLET BY MOUTH AT BEDTIME AS NEEDED FOR SLEEP 90 tablet 2   Facility-Administered Medications Ordered in Other Encounters  Medication Dose Route Frequency Provider Last Rate Last Admin   gadopentetate dimeglumine (MAGNEVIST) injection 20 mL  20 mL Intravenous Once PRN Marcial Pacas, MD        Allergies  Allergen Reactions   Promethazine Anaphylaxis   Benadryl [Diphenhydramine Hcl] Hives   Phenothiazines Other (See Comments)    Family History  Problem Relation Age of Onset   Cancer Father        Bladder,  in Hospice    Diabetes type II Father    COPD Father    Coronary artery disease Father    Heart disease Father 31   Diabetes Mother    Hyperlipidemia Mother    Hypertension Mother    Early death Paternal Grandmother    Heart disease Paternal Grandmother        CAD   Prostate cancer Paternal Alanda Slim' disease Daughter     Social History   Socioeconomic History   Marital status: Single    Spouse name: Mamye Bolds   Number of children: 3   Years of education: 14   Highest education level: Not on file  Occupational History   Occupation: Collections  Tobacco Use   Smoking status: Never   Smokeless tobacco: Never  Vaping Use   Vaping Use: Never used  Substance and Sexual Activity   Alcohol use: No   Drug use: No   Sexual activity: Not Currently  Other Topics Concern   Not on file  Social History Narrative   Lives at home with 1 daughter and 2 grandkids    2 daughters    Widowed    Works [employer] homes    Right-handed.   4 cups caffeine per day.   Social Determinants of Health   Financial Resource Strain: Not on file  Food Insecurity: Not on file  Transportation Needs: Not on file  Physical Activity: Not on file  Stress: Not on file  Social Connections: Not on file  Intimate Partner Violence: Not on file     Constitutional: Denies fever, malaise, fatigue, headache or abrupt weight changes.  HEENT: Denies eye pain, eye redness, ear pain, ringing in the ears, wax buildup, runny nose, nasal congestion, bloody nose, or sore throat.  Respiratory: Denies difficulty breathing, shortness of breath, cough or sputum production.   Cardiovascular: Patient reports left-sided chest pain.  Denies chest tightness, palpitations or swelling in the hands or feet.  Gastrointestinal: Patient reports nausea.  Denies abdominal pain, bloating, constipation, diarrhea or blood in the stool.  GU: Denies urgency, frequency, pain with urination, burning sensation, blood in urine, odor or  discharge. Musculoskeletal: Patient reports left upper back pain.  Denies decrease in range of motion, difficulty with gait, or joint pain and swelling.  Skin: Denies redness, rashes, lesions or ulcercations.  Neurological: Denies dizziness, difficulty with memory, difficulty with speech or problems with balance and coordination.   No other specific complaints in a complete review of systems (except as listed in HPI above).  Physical Exam Updated Vital Signs BP 116/81   Pulse 72   Temp 98.9 F (37.2 C) (Oral)   Resp 16   Ht '5\' 7"'$  (1.702 m)   Wt 79.4 kg   SpO2 94%   BMI 27.41 kg/m  Physical Exam   Wt Readings from Last 3 Encounters:  05/23/22 79.4 kg  05/23/22 79.7 kg  11/30/21 79.7 kg    General: Appears her stated age, obese, in NAD. Cardiovascular: Normal rate and rhythm. S1,S2 noted.  No murmur, rubs or gallops noted.  Pulmonary/Chest: Normal effort and positive vesicular breath sounds. No respiratory distress. No wheezes, rales or ronchi noted.  Abdomen: Soft and nontender. Normal bowel sounds. No distention or masses noted.  Musculoskeletal: Significant pain with palpation of the left subscapular region.  No bony tenderness noted over the thoracic spine.  Shoulder shrug equal.  Strength 5/5 BUE.  Handgrips equal. Neurological: Alert and oriented.  Psychiatric: Mildly anxious appearing.    ED Results / Procedures / Treatments   Labs   EKG ED ECG REPORT   Date: 05/23/2022  EKG Time: 7:13 PM  Rate: 77  Rhythm: normal sinus rhythm,  unchanged from previous tracings, LBBB  Axis: Left Axis  Intervals:left bundle branch block  ST&T Change: None  Narrative Interpretation: Sinus rhythm with left bundle branch block, unchanged from previous ECG in 2018   Radiology  Imaging Orders         DG Chest 2 View    IMPRESSION: No active cardiopulmonary disease   Medications Ordered in ED Medications  traMADol (ULTRAM) tablet 50 mg (has no administration in time range)   cyclobenzaprine (FLEXERIL) tablet 10 mg (has no administration in time range)    ED Course/ Medical Decision Making/ A&P   Left Upper Back Pain, Left Side Chest Pain:  DDx include ACS, GERD, gastritis, esophageal stenosis, esophageal spasm, pancreatitis, muscle spasm ECG does not show any evidence of ST elevation, unchanged from prior ECG in 2018 Chest x-ray does not show any evidence of infiltrate or concern for infection CBC does not show any evidence of leukocytosis or anemia.  She does have minor elevation in her platelets which would not be acutely concerning BMET shows mild elevation in serum creatinine with a normal GFR.  No evidence of electrolyte abnormality HFP normal Lipase marginally elevated but given her absence of abdominal pain, vomiting no indication for abdominal CT at this time D-dimer normal, low suspicion for PE Initial troponin normal, second troponin normal Discussed findings with patient and daughter.  Given the findings it is felt that this is most likely musculoskeletal in nature. Tramadol 50 mg p.o. x1 Flexeril 10 mg p.o. x1 Rx for Tramadol 50 mg every 8 hours as needed Rx for Flexeril  5 mg every 8 hours as needed-sedation caution given Rx for Nitro 0.4 mg to have on hand as needed for chest pain She will call her PCP and cardiologist tomorrow for follow-up as an outpatient  Final Clinical Impression(s) / ED Diagnoses Final diagnoses:  Upper back pain on left side  Left-sided chest pain    Rx / DC Orders ED Discharge Orders          Ordered    traMADol (ULTRAM) 50 MG tablet  Every 8 hours PRN        05/23/22 1910    cyclobenzaprine (FLEXERIL) 5 MG tablet  3 times daily PRN        05/23/22 1910    nitroGLYCERIN (NITROSTAT) 0.4 MG SL tablet  Every 5 min PRN        05/23/22 1910              Jearld Fenton, NP 05/23/22 1913    Blake Divine, MD 05/23/22 2003

## 2022-05-23 NOTE — Discharge Instructions (Addendum)
You will be transported to the emergency department via EMS.  I am concerned that you have something going on with your heart.  You will need additional evaluation that is not available here in the urgent care.

## 2022-05-23 NOTE — ED Notes (Signed)
Patient is being discharged from the Urgent Care and sent to the Emergency Department via EMS . Per Dr. Susa Simmonds, patient is in need of higher level of care due to chest and back pain. Patient is aware and verbalizes understanding of plan of care.  Vitals:   05/23/22 1405  BP: (!) 127/93  Pulse: 76  Resp: 14  Temp: 98.1 F (36.7 C)  SpO2: 98%

## 2022-05-23 NOTE — ED Notes (Signed)
Blue top tube sent with other labs. 

## 2022-05-23 NOTE — ED Triage Notes (Signed)
Patient states that she went to turn a light switch off yesterday and felt a sharp pain in her mid back.  Patient states that the pain brought her to her knees.  Patient states that she was seen at [facility] yesterday and had a back x-ray there.  Patient states that if her pain was not improving that she need to be seen to have an EKG.  Patient states that she has since started having nausea and neck pain and ongoing back pain.  Patient states her pain is worse when she raises her legs up. Patient denies chest pain or SOB

## 2022-06-02 ENCOUNTER — Encounter: Payer: Self-pay | Admitting: Internal Medicine

## 2022-06-05 ENCOUNTER — Other Ambulatory Visit: Payer: Self-pay | Admitting: Internal Medicine

## 2022-06-05 DIAGNOSIS — E669 Obesity, unspecified: Secondary | ICD-10-CM

## 2022-06-05 MED ORDER — TIRZEPATIDE 5 MG/0.5ML ~~LOC~~ SOAJ: 5.0000 mg | SUBCUTANEOUS | 0 refills | Status: DC

## 2022-06-05 NOTE — Assessment & Plan Note (Signed)
Changing therapy from ozempic to [facility] due to supply issues.  5 mg dose went t o [facility]

## 2022-08-03 ENCOUNTER — Encounter: Payer: Self-pay | Admitting: Internal Medicine

## 2022-08-03 ENCOUNTER — Ambulatory Visit (INDEPENDENT_AMBULATORY_CARE_PROVIDER_SITE_OTHER): Payer: BC Managed Care – PPO

## 2022-08-03 ENCOUNTER — Ambulatory Visit (INDEPENDENT_AMBULATORY_CARE_PROVIDER_SITE_OTHER): Payer: BC Managed Care – PPO | Admitting: Internal Medicine

## 2022-08-03 VITALS — BP 116/72 | HR 80 | Temp 98.2°F | Ht 67.0 in | Wt 172.6 lb

## 2022-08-03 DIAGNOSIS — M25552 Pain in left hip: Secondary | ICD-10-CM | POA: Diagnosis not present

## 2022-08-03 DIAGNOSIS — E559 Vitamin D deficiency, unspecified: Secondary | ICD-10-CM

## 2022-08-03 DIAGNOSIS — E669 Obesity, unspecified: Secondary | ICD-10-CM

## 2022-08-03 DIAGNOSIS — Z23 Encounter for immunization: Secondary | ICD-10-CM

## 2022-08-03 DIAGNOSIS — G8929 Other chronic pain: Secondary | ICD-10-CM

## 2022-08-03 DIAGNOSIS — I10 Essential (primary) hypertension: Secondary | ICD-10-CM

## 2022-08-03 DIAGNOSIS — E1169 Type 2 diabetes mellitus with other specified complication: Secondary | ICD-10-CM

## 2022-08-03 DIAGNOSIS — I152 Hypertension secondary to endocrine disorders: Secondary | ICD-10-CM | POA: Diagnosis not present

## 2022-08-03 DIAGNOSIS — R5382 Chronic fatigue, unspecified: Secondary | ICD-10-CM | POA: Diagnosis not present

## 2022-08-03 DIAGNOSIS — E1159 Type 2 diabetes mellitus with other circulatory complications: Secondary | ICD-10-CM | POA: Diagnosis not present

## 2022-08-03 DIAGNOSIS — E538 Deficiency of other specified B group vitamins: Secondary | ICD-10-CM | POA: Diagnosis not present

## 2022-08-03 DIAGNOSIS — R208 Other disturbances of skin sensation: Secondary | ICD-10-CM | POA: Diagnosis not present

## 2022-08-03 DIAGNOSIS — M25551 Pain in right hip: Secondary | ICD-10-CM

## 2022-08-03 DIAGNOSIS — Z1231 Encounter for screening mammogram for malignant neoplasm of breast: Secondary | ICD-10-CM

## 2022-08-03 DIAGNOSIS — Z8739 Personal history of other diseases of the musculoskeletal system and connective tissue: Secondary | ICD-10-CM | POA: Diagnosis not present

## 2022-08-03 DIAGNOSIS — Z0000 Encounter for general adult medical examination without abnormal findings: Secondary | ICD-10-CM

## 2022-08-03 MED ORDER — ACYCLOVIR 400 MG PO TABS: 400.0000 mg | ORAL_TABLET | Freq: Every day | ORAL | 1 refills | Status: AC

## 2022-08-03 MED ORDER — CYANOCOBALAMIN 1000 MCG/ML IJ SOLN: INTRAMUSCULAR | 1 refills | Status: DC

## 2022-08-03 MED ORDER — SERTRALINE HCL 50 MG PO TABS: 50.0000 mg | ORAL_TABLET | Freq: Every day | ORAL | 2 refills | Status: DC

## 2022-08-03 MED ORDER — CELECOXIB 200 MG PO CAPS: 200.0000 mg | ORAL_CAPSULE | Freq: Every day | ORAL | 2 refills | Status: DC

## 2022-08-03 MED ORDER — METFORMIN HCL ER 750 MG PO TB24: 750.0000 mg | ORAL_TABLET | Freq: Every day | ORAL | 1 refills | Status: DC

## 2022-08-03 MED ORDER — CYCLOBENZAPRINE HCL 5 MG PO TABS: 5.0000 mg | ORAL_TABLET | Freq: Three times a day (TID) | ORAL | 0 refills | Status: DC | PRN

## 2022-08-03 MED ORDER — BETAMETHASONE VALERATE 0.1 % EX CREA: TOPICAL_CREAM | Freq: Two times a day (BID) | CUTANEOUS | 0 refills | Status: DC

## 2022-08-03 MED ORDER — TIRZEPATIDE 5 MG/0.5ML ~~LOC~~ SOAJ: 5.0000 mg | SUBCUTANEOUS | 0 refills | Status: DC

## 2022-08-03 NOTE — Patient Instructions (Addendum)
  You can add up to 2000 mg of acetominophen (tylenol) every day safely  In divided doses (500 mg every 6 hours  Or 1000 mg every 12 hours.)   Goal for BMI < 25 is 163 lbs . Not necessarily YOUR GOAL. Start a regular exercise program!    Tetanus vaccine due November 2014

## 2022-08-03 NOTE — Progress Notes (Addendum)
Patient ID: Mary Bryant, female    DOB: [birth year]  Age: 57 y.o. MRN: [id number]  The patient is here for annual preventive examination and management of other chronic and acute problems.   The risk factors are reflected in the social history.   The roster of all physicians providing medical care to patient - is listed in the Snapshot section of the chart.   Activities of daily living:  The patient is 100% independent in all ADLs: dressing, toileting, feeding as well as independent mobility   Home safety : The patient has smoke detectors in the home. They wear seatbelts.  There are no unsecured firearms at home. There is no violence in the home.    There is no risks for hepatitis, STDs or HIV. There is no   history of blood transfusion. They have no travel history to infectious disease endemic areas of the world.   The patient has seen their dentist in the last six month. They have seen their eye doctor in the last year. The patinet  denies slight hearing difficulty with regard to whispered voices and some television programs.  They have deferred audiologic testing in the last year.  They do not  have excessive sun exposure. Discussed the need for sun protection: hats, long sleeves and use of sunscreen if there is significant sun exposure.    Diet: the importance of a healthy diet is discussed. They do have a healthy diet.   The benefits of regular aerobic exercise were discussed. The patient  exercises  3 to 5 days per week  for  60 minutes.    Depression screen: there are no signs or vegative symptoms of depression- irritability, change in appetite, anhedonia, sadness/tearfullness.   The following portions of the patient's history were reviewed and updated as appropriate: allergies, current medications, past family history, past medical history,  past surgical history, past social history  and problem list.   Visual acuity was not assessed per patient preference since the patient has  regular follow up with an  ophthalmologist. Hearing and body mass index were assessed and reviewed.    During the course of the visit the patient was educated and counseled about appropriate screening and preventive services including : fall prevention , diabetes screening, nutrition counseling, colorectal cancer screening, and recommended immunizations.    Chief Complaint:  1) bilateral hip pain described as dull aching but hips lock up during Intercourse !   2) obesity:  she started taking  ozempic at a weight 209 lbs in Nov 2022.  She has lost 30 lbs, with a goal for ten more  Review of Symptoms  Patient denies headache, fevers, malaise, unintentional weight loss, skin rash, eye pain, sinus congestion and sinus pain, sore throat, dysphagia,  hemoptysis , cough, dyspnea, wheezing, chest pain, palpitations, orthopnea, edema, abdominal pain, nausea, melena, diarrhea, constipation, flank pain, dysuria, hematuria, urinary  Frequency, nocturia, numbness, tingling, seizures,  Focal weakness, Loss of consciousness,  Tremor, insomnia, depression, anxiety, and suicidal ideation.    Physical Exam:  BP 116/72   Pulse 80   Temp 98.2 F (36.8 C) (Oral)   Ht '5\' 7"'$  (1.702 m)   Wt 172 lb 9.6 oz (78.3 kg)   SpO2 96%   BMI 27.03 kg/m    Physical Exam Vitals reviewed.  Constitutional:      General: She is not in acute distress.    Appearance: Normal appearance. She is well-developed and normal weight. She is not ill-appearing, toxic-appearing or  diaphoretic.  HENT:     Head: Normocephalic.     Right Ear: Tympanic membrane, ear canal and external ear normal. There is no impacted cerumen.     Left Ear: Tympanic membrane, ear canal and external ear normal. There is no impacted cerumen.     Nose: Nose normal.     Mouth/Throat:     Mouth: Mucous membranes are moist.     Pharynx: Oropharynx is clear.  Eyes:     General: No scleral icterus.       Right eye: No discharge.        Left eye: No  discharge.     Conjunctiva/sclera: Conjunctivae normal.     Pupils: Pupils are equal, round, and reactive to light.  Neck:     Thyroid: No thyromegaly.     Vascular: No carotid bruit or JVD.  Cardiovascular:     Rate and Rhythm: Normal rate and regular rhythm.     Heart sounds: Normal heart sounds.  Pulmonary:     Effort: Pulmonary effort is normal. No respiratory distress.     Breath sounds: Normal breath sounds.  Chest:  Breasts:    Breasts are symmetrical.     Right: Normal. No swelling, inverted nipple, mass, nipple discharge, skin change or tenderness.     Left: Normal. No swelling, inverted nipple, mass, nipple discharge, skin change or tenderness.  Abdominal:     General: Bowel sounds are normal.     Palpations: Abdomen is soft. There is no mass.     Tenderness: There is no abdominal tenderness. There is no guarding or rebound.  Musculoskeletal:        General: Normal range of motion.     Cervical back: Normal range of motion and neck supple.  Lymphadenopathy:     Cervical: No cervical adenopathy.     Upper Body:     Right upper body: No supraclavicular, axillary or pectoral adenopathy.     Left upper body: No supraclavicular, axillary or pectoral adenopathy.  Skin:    General: Skin is warm and dry.  Neurological:     General: No focal deficit present.     Mental Status: She is alert and oriented to person, place, and time. Mental status is at baseline.  Psychiatric:        Mood and Affect: Mood normal.        Behavior: Behavior normal.        Thought Content: Thought content normal.        Judgment: Judgment normal.     Assessment and Plan: Obesity, diabetes, and hypertension syndrome ([place]) Assessment & Plan: Managed with metformin and Ozempic . she has lost 37 lbs and is tolerating mounjaro 5 mg weekly .   Orders: -     Hemoglobin A1c -     Microalbumin / creatinine urine ratio -     Comprehensive metabolic panel -     Lipid panel -     LDL cholesterol,  direct  B12 deficiency  Vitamin D deficiency  Chronic fatigue -     CBC with Differential/Platelet -     TSH  Encounter for screening mammogram for malignant neoplasm of breast -     3D Screening Mammogram, Left and Right; Future  Chronic hip pain, bilateral -     DG HIPS BILAT W OR W/O PELVIS 3-4 VIEWS; Future  Burning sensation of feet -     B12 and Folate Panel  Need for immunization against influenza -  Flu Vaccine QUAD 66moIM (Fluarix, Fluzone & Alfiuria Quad PF)  Encounter for preventive health examination Assessment & Plan: age appropriate education and counseling updated, referrals for preventative services and immunizations addressed, dietary and smoking counseling addressed, most recent labs reviewed.  I have personally reviewed and have noted:   1) the patient's medical and social history 2) The pt's use of alcohol, tobacco, and illicit drugs 3) The patient's current medications and supplements 4) Functional ability including ADL's, fall risk, home safety risk, hearing and visual impairment 5) Diet and physical activities 6) Evidence for depression or mood disorder 7) The patient's height, weight, and BMI have been recorded in the chartrhythm.     I have made referrals, and provided counseling and education based on review of the above    Essential hypertension Assessment & Plan: Well controlled on current regimen of metoprolol.  . Renal function stable, no changes today.   Other orders -     Acyclovir; Take 1 tablet (400 mg total) by mouth 5 (five) times daily. For one week  As needed for blisters  Dispense: 35 tablet; Refill: 1 -     Betamethasone Valerate; Apply topically 2 (two) times daily.  Dispense: 30 g; Refill: 0 -     Cyanocobalamin; INJECT 1 ML (1,000 MCG TOTAL) INTO THE MUSCLE EVERY 30 DAYS.  Dispense: 10 mL; Refill: 1 -     Cyclobenzaprine HCl; Take 1 tablet (5 mg total) by mouth 3 (three) times daily as needed for muscle spasms.  Dispense: 15  tablet; Refill: 0 -     metFORMIN HCl ER; Take 1 tablet (750 mg total) by mouth daily with breakfast.  Dispense: 90 tablet; Refill: 1 -     Sertraline HCl; Take 1 tablet (50 mg total) by mouth daily.  Dispense: 90 tablet; Refill: 2 -     Tirzepatide; Inject 5 mg into the skin once a week.  Dispense: 6 mL; Refill: 0 -     Celecoxib; Take 1 capsule (200 mg total) by mouth daily. As needed for joint pain  Dispense: 30 capsule; Refill: 2    Return in about 6 months (around 02/01/2023).  TCrecencio Mc MD

## 2022-08-03 NOTE — Assessment & Plan Note (Addendum)
Managed with metformin and Ozempic . she has lost 37 lbs and is tolerating mounjaro 5 mg weekly .   Statin therapy advised  Lab Results  Component Value Date   HGBA1C 5.7 08/03/2022   Lab Results  Component Value Date   CHOL 197 08/03/2022   HDL 61.40 08/03/2022   LDLCALC 104 (H) 08/03/2022   LDLDIRECT 117.0 08/03/2022   TRIG 156.0 (H) 08/03/2022   CHOLHDL 3 08/03/2022

## 2022-08-04 LAB — LDL CHOLESTEROL, DIRECT: Direct LDL: 117 mg/dL

## 2022-08-04 LAB — MICROALBUMIN / CREATININE URINE RATIO
Creatinine,U: 268.4 mg/dL
Microalb Creat Ratio: 0.6 mg/g (ref 0.0–30.0)
Microalb, Ur: 1.5 mg/dL (ref 0.0–1.9)

## 2022-08-04 LAB — CBC WITH DIFFERENTIAL/PLATELET
Basophils Absolute: 0.1 10*3/uL (ref 0.0–0.1)
Basophils Relative: 0.7 % (ref 0.0–3.0)
Eosinophils Absolute: 0.1 10*3/uL (ref 0.0–0.7)
Eosinophils Relative: 1.9 % (ref 0.0–5.0)
HCT: 40.5 % (ref 36.0–46.0)
Hemoglobin: 13.6 g/dL (ref 12.0–15.0)
Lymphocytes Relative: 26.6 % (ref 12.0–46.0)
Lymphs Abs: 1.8 10*3/uL (ref 0.7–4.0)
MCHC: 33.5 g/dL (ref 30.0–36.0)
MCV: 86.7 fl (ref 78.0–100.0)
Monocytes Absolute: 0.5 10*3/uL (ref 0.1–1.0)
Monocytes Relative: 7.4 % (ref 3.0–12.0)
Neutro Abs: 4.4 10*3/uL (ref 1.4–7.7)
Neutrophils Relative %: 63.4 % (ref 43.0–77.0)
Platelets: 319 10*3/uL (ref 150.0–400.0)
RBC: 4.66 Mil/uL (ref 3.87–5.11)
RDW: 14.2 % (ref 11.5–15.5)
WBC: 6.9 10*3/uL (ref 4.0–10.5)

## 2022-08-04 LAB — COMPREHENSIVE METABOLIC PANEL
ALT: 15 U/L (ref 0–35)
AST: 16 U/L (ref 0–37)
Albumin: 4.4 g/dL (ref 3.5–5.2)
Alkaline Phosphatase: 62 U/L (ref 39–117)
BUN: 10 mg/dL (ref 6–23)
CO2: 27 mEq/L (ref 19–32)
Calcium: 9.5 mg/dL (ref 8.4–10.5)
Chloride: 103 mEq/L (ref 96–112)
Creatinine, Ser: 0.96 mg/dL (ref 0.40–1.20)
GFR: 66.82 mL/min (ref >60.00)
Glucose, Bld: 84 mg/dL (ref 70–99)
Potassium: 4.2 mEq/L (ref 3.5–5.1)
Sodium: 140 mEq/L (ref 135–145)
Total Bilirubin: 0.4 mg/dL (ref 0.2–1.2)
Total Protein: 7.5 g/dL (ref 6.0–8.3)

## 2022-08-04 LAB — B12 AND FOLATE PANEL
Folate: 7.3 ng/mL (ref >5.9)
Vitamin B-12: 259 pg/mL (ref 211–911)

## 2022-08-04 LAB — TSH: TSH: 1.78 u[IU]/mL (ref 0.35–5.50)

## 2022-08-04 LAB — LIPID PANEL
Cholesterol: 197 mg/dL (ref 0–200)
HDL: 61.4 mg/dL (ref >39.00)
LDL Cholesterol: 104 mg/dL — ABNORMAL HIGH (ref 0–99)
NonHDL: 135.35
Total CHOL/HDL Ratio: 3
Triglycerides: 156 mg/dL — ABNORMAL HIGH (ref 0.0–149.0)
VLDL: 31.2 mg/dL (ref 0.0–40.0)

## 2022-08-04 LAB — HEMOGLOBIN A1C: Hgb A1c MFr Bld: 5.7 % (ref 4.6–6.5)

## 2022-08-04 NOTE — Assessment & Plan Note (Signed)
Well controlled on current regimen of metoprolol.  . Renal function stable, no changes today.

## 2022-08-04 NOTE — Assessment & Plan Note (Signed)

## 2022-08-08 ENCOUNTER — Encounter: Payer: Self-pay | Admitting: Internal Medicine

## 2022-08-09 ENCOUNTER — Other Ambulatory Visit: Payer: Self-pay

## 2022-08-09 MED ORDER — ATORVASTATIN CALCIUM 10 MG PO TABS: 10.0000 mg | ORAL_TABLET | Freq: Every day | ORAL | 0 refills | Status: DC

## 2022-08-09 NOTE — Telephone Encounter (Signed)
Atorvastatin '10mg'$  sent to pt pharm per lab result note.

## 2022-08-25 LAB — HM DIABETES EYE EXAM

## 2022-09-01 ENCOUNTER — Other Ambulatory Visit: Payer: Self-pay | Admitting: Internal Medicine

## 2022-09-20 ENCOUNTER — Encounter: Payer: Self-pay | Admitting: Internal Medicine

## 2022-10-01 ENCOUNTER — Telehealth: Payer: Self-pay

## 2022-10-01 NOTE — Telephone Encounter (Signed)
LMTCB. We have received the FMLA paperwork but Dr. Derrel Nip would like pt to schedule an appt to discuss and complete.

## 2022-10-04 NOTE — Telephone Encounter (Signed)
Pt came in to office this morning to find out paperwork. I let her know that she would need to schedule an appt before the paperwork could be filled out. Pt has ben scheduled for tomorrow.

## 2022-10-05 ENCOUNTER — Encounter: Payer: Self-pay | Admitting: Internal Medicine

## 2022-10-05 ENCOUNTER — Telehealth (INDEPENDENT_AMBULATORY_CARE_PROVIDER_SITE_OTHER): Payer: BC Managed Care – PPO | Admitting: Internal Medicine

## 2022-10-05 VITALS — Ht 67.0 in | Wt 172.6 lb

## 2022-10-05 DIAGNOSIS — Z638 Other specified problems related to primary support group: Secondary | ICD-10-CM | POA: Diagnosis not present

## 2022-10-05 NOTE — Progress Notes (Unsigned)
Virtual Visit via [place]   Note   This format is felt to be most appropriate for this patient at this time.  All issues noted in this document were discussed and addressed.  No physical exam was performed (except for noted visual exam findings with Video Visits).   I connected with Deolinda  on 10/06/22 at 10:00 AM EDT by a video enabled telemedicine application or telephone and verified that I am speaking with the correct person using two identifiers. Location patient: home Location provider: work or home office Persons participating in the virtual visit: patient, provider  I discussed the limitations, risks, security and privacy concerns of performing an evaluation and management service by telephone and the availability of in person appointments. I also discussed with the patient that there may be a patient responsible charge related to this service. The patient expressed understanding and agreed to proceed.  Reason for visit: FMLA rrequest   HPI:  Patient is requesting application for FMLA  as the daughter of [name],  primary caregiver providing transportation. Patient provides physical support when patient has fallen down (which occurs 2 to 3 times weekly),  assistance with showering  and providing generally hygiene following episodes  of uncontrolled diarrhea, and transportation to and from doctor's , of which there are many (cardiology for atrial fib. Oncology for anemia, urology  for urinary incontinence , etc)  ROS: See pertinent positives and negatives per HPI.  Past Medical History:  Diagnosis Date   Dental bridge present    No bridge.  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   PONV (postoperative nausea and  vomiting)    rhinitis allergic    Trigeminal neuralgia    Wears contact lenses    Wears hearing aid    bilateral    Past Surgical History:  Procedure Laterality Date   ABDOMINAL HYSTERECTOMY  07/19/1998   no history of ca    CARDIAC SURGERY     CHOLECYSTECTOMY  07/19/2006   COLONOSCOPY WITH PROPOFOL N/A 05/11/2019   Procedure: COLONOSCOPY WITH PROPOFOL;  Surgeon: Lucilla Lame, MD;  Location: [facility];  Service: Endoscopy;  Laterality: N/A;   ESOPHAGEAL DILATION  05/11/2019   Procedure: ESOPHAGEAL DILATION;  Surgeon: Lucilla Lame, MD;  Location: [facility];  Service: Endoscopy;;   ESOPHAGOGASTRODUODENOSCOPY (EGD) WITH PROPOFOL N/A 05/12/2016   Procedure: ESOPHAGOGASTRODUODENOSCOPY (EGD) WITH PROPOFOL;  Surgeon: Jonathon Bellows, MD;  Location: [facility];  Service: Endoscopy;  Laterality: N/A;   ESOPHAGOGASTRODUODENOSCOPY (EGD) WITH PROPOFOL N/A 05/11/2019   Procedure: ESOPHAGOGASTRODUODENOSCOPY (EGD) WITH PROPOFOL;  Surgeon: Lucilla Lame, MD;  Location: [facility];  Service: Endoscopy;  Laterality: N/A;  Diabetic - oral meds   POLYPECTOMY  05/11/2019   Procedure: POLYPECTOMY;  Surgeon: Lucilla Lame, MD;  Location: [facility];  Service: Endoscopy;;    Family History  Problem Relation Age of Onset   Cancer Father        Bladder,  in Hospice   Diabetes type II Father    COPD Father    Coronary artery disease Father    Heart disease Father 77   Diabetes Mother    Hyperlipidemia Mother    Hypertension  Mother    Early death Paternal Grandmother    Heart disease Paternal Grandmother        CAD   Prostate cancer Paternal Alanda Slim' disease Daughter     SOCIAL HX:  reports that she has never smoked. She has never used smokeless tobacco. She reports that she does not drink alcohol and does not use drugs.    Current Outpatient Medications:    acetaminophen (TYLENOL) 500 MG tablet, Take 1,000 mg by mouth every 6 (six) hours as  needed for mild pain., Disp: , Rfl:    acyclovir (ZOVIRAX) 400 MG tablet, Take 1 tablet (400 mg total) by mouth 5 (five) times daily. For one week  As needed for blisters, Disp: 35 tablet, Rfl: 1   ALPRAZolam (XANAX) 0.25 MG tablet, Take 1 tablet (0.25 mg total) by mouth daily as needed for anxiety., Disp: 30 tablet, Rfl: 5   atorvastatin (LIPITOR) 10 MG tablet, Take 1 tablet (10 mg total) by mouth daily., Disp: 90 tablet, Rfl: 0   betamethasone valerate (VALISONE) 0.1 % cream, Apply topically 2 (two) times daily., Disp: 30 g, Rfl: 0   celecoxib (CELEBREX) 200 MG capsule, Take 1 capsule (200 mg total) by mouth daily. As needed for joint pain, Disp: 30 capsule, Rfl: 2   cyanocobalamin (VITAMIN B12) 1000 MCG/ML injection, INJECT 1 ML (1,000 MCG TOTAL) INTO THE MUSCLE EVERY 30 DAYS., Disp: 10 mL, Rfl: 1   cyclobenzaprine (FLEXERIL) 5 MG tablet, Take 1 tablet (5 mg total) by mouth 3 (three) times daily as needed for muscle spasms., Disp: 15 tablet, Rfl: 0   metFORMIN (GLUCOPHAGE-XR) 750 MG 24 hr tablet, Take 1 tablet (750 mg total) by mouth daily with breakfast., Disp: 90 tablet, Rfl: 1   metoprolol succinate (TOPROL-XL) 50 MG 24 hr tablet, TAKE 1 TABLET BY MOUTH EVERY DAY WITH OR IMMEDIATELY FOLLOWING A MEAL, Disp: 90 tablet, Rfl: 1   nitroGLYCERIN (NITROSTAT) 0.4 MG SL tablet, Place 1 tablet (0.4 mg total) under the tongue every 5 (five) minutes as needed for chest pain., Disp: 30 tablet, Rfl: 0   ondansetron (ZOFRAN-ODT) 4 MG disintegrating tablet, TAKE 1 TABLET BY MOUTH EVERY 8 HOURS AS NEEDED FOR NAUSEA AND VOMITING, Disp: 30 tablet, Rfl: 2   pantoprazole (PROTONIX) 40 MG tablet, TAKE 1 TABLET BY MOUTH EVERY DAY, Disp: 90 tablet, Rfl: 1   sertraline (ZOLOFT) 50 MG tablet, Take 1 tablet (50 mg total) by mouth daily., Disp: 90 tablet, Rfl: 2   silver sulfADIAZINE (SILVADENE) 1 % cream, Apply 1 application topically daily., Disp: 400 g, Rfl: 1   Syringe/Needle, Disp, (SYRINGE 3CC/25GX1") 25G X 1" 3 ML  MISC, Use for b12 injections, Disp: 50 each, Rfl: 0   tirzepatide (MOUNJARO) 5 MG/0.5ML Pen, Inject 5 mg into the skin once a week., Disp: 6 mL, Rfl: 0   traMADol (ULTRAM) 50 MG tablet, Take 1 tablet (50 mg total) by mouth every 8 (eight) hours as needed., Disp: 15 tablet, Rfl: 0   traZODone (DESYREL) 50 MG tablet, TAKE 1/2 TO 1 TABLET BY MOUTH AT BEDTIME AS NEEDED FOR SLEEP, Disp: 90 tablet, Rfl: 2 No current facility-administered medications for this visit.  Facility-Administered Medications Ordered in Other Visits:    gadopentetate dimeglumine (MAGNEVIST) injection 20 mL, 20 mL, Intravenous, Once PRN, Marcial Pacas, MD  EXAM:  VITALS per patient if applicable:  GENERAL: alert, oriented, appears well and in no acute distress  HEENT: atraumatic, conjunttiva clear, no obvious abnormalities on inspection  of external nose and ears  NECK: normal movements of the head and neck  LUNGS: on inspection no signs of respiratory distress, breathing rate appears normal, no obvious gross SOB, gasping or wheezing  CV: no obvious cyanosis  MS: moves all visible extremities without noticeable abnormality  PSYCH/NEURO: pleasant and cooperative, no obvious depression or anxiety, speech and thought processing grossly intact  ASSESSMENT AND PLAN: Caregiver role strain Assessment & Plan: FMLA form completed to protect patient from termination due to frequent absences from work  to care for mother        I discussed the assessment and treatment plan with the patient. The patient was provided an opportunity to ask questions and all were answered. The patient agreed with the plan and demonstrated an understanding of the instructions.   The patient was advised to call back or seek an in-person evaluation if the symptoms worsen or if the condition fails to improve as anticipated.   I spent 30 minutes dedicated to the care of this patient on the date of this encounter to include pre-visit review of his  medical history,  Face-to-face time with the patient , and post visit ordering of testing and therapeutics.    Crecencio Mc, MD

## 2022-10-06 DIAGNOSIS — Z638 Other specified problems related to primary support group: Secondary | ICD-10-CM | POA: Insufficient documentation

## 2022-10-06 NOTE — Assessment & Plan Note (Signed)
FMLA form completed to protect patient from termination due to frequent absences from work  to care for mother

## 2022-10-25 ENCOUNTER — Other Ambulatory Visit: Payer: Self-pay | Admitting: Internal Medicine

## 2022-11-01 ENCOUNTER — Other Ambulatory Visit: Payer: Self-pay | Admitting: Internal Medicine

## 2022-11-03 ENCOUNTER — Other Ambulatory Visit: Payer: Self-pay | Admitting: Internal Medicine

## 2023-02-01 ENCOUNTER — Other Ambulatory Visit: Payer: Self-pay | Admitting: Internal Medicine

## 2023-02-02 ENCOUNTER — Ambulatory Visit: Payer: BC Managed Care – PPO | Admitting: Internal Medicine

## 2023-02-02 ENCOUNTER — Other Ambulatory Visit: Payer: Self-pay | Admitting: Internal Medicine

## 2023-03-31 ENCOUNTER — Other Ambulatory Visit: Payer: Self-pay | Admitting: Internal Medicine

## 2023-03-31 ENCOUNTER — Other Ambulatory Visit: Payer: Self-pay | Admitting: Family

## 2023-05-12 ENCOUNTER — Other Ambulatory Visit: Payer: Self-pay | Admitting: Internal Medicine

## 2023-05-20 ENCOUNTER — Other Ambulatory Visit: Payer: Self-pay | Admitting: Internal Medicine

## 2023-05-20 IMAGING — DX DG FOOT COMPLETE 3+V*R*
3 series · 3 of 3 positions shown · non-contrast
Comparison: 04/21/2017

CLINICAL DATA: 54-year-old female with right foot pain after
injury.

EXAM:
RIGHT FOOT COMPLETE - 3+ VIEW

[foot ap]
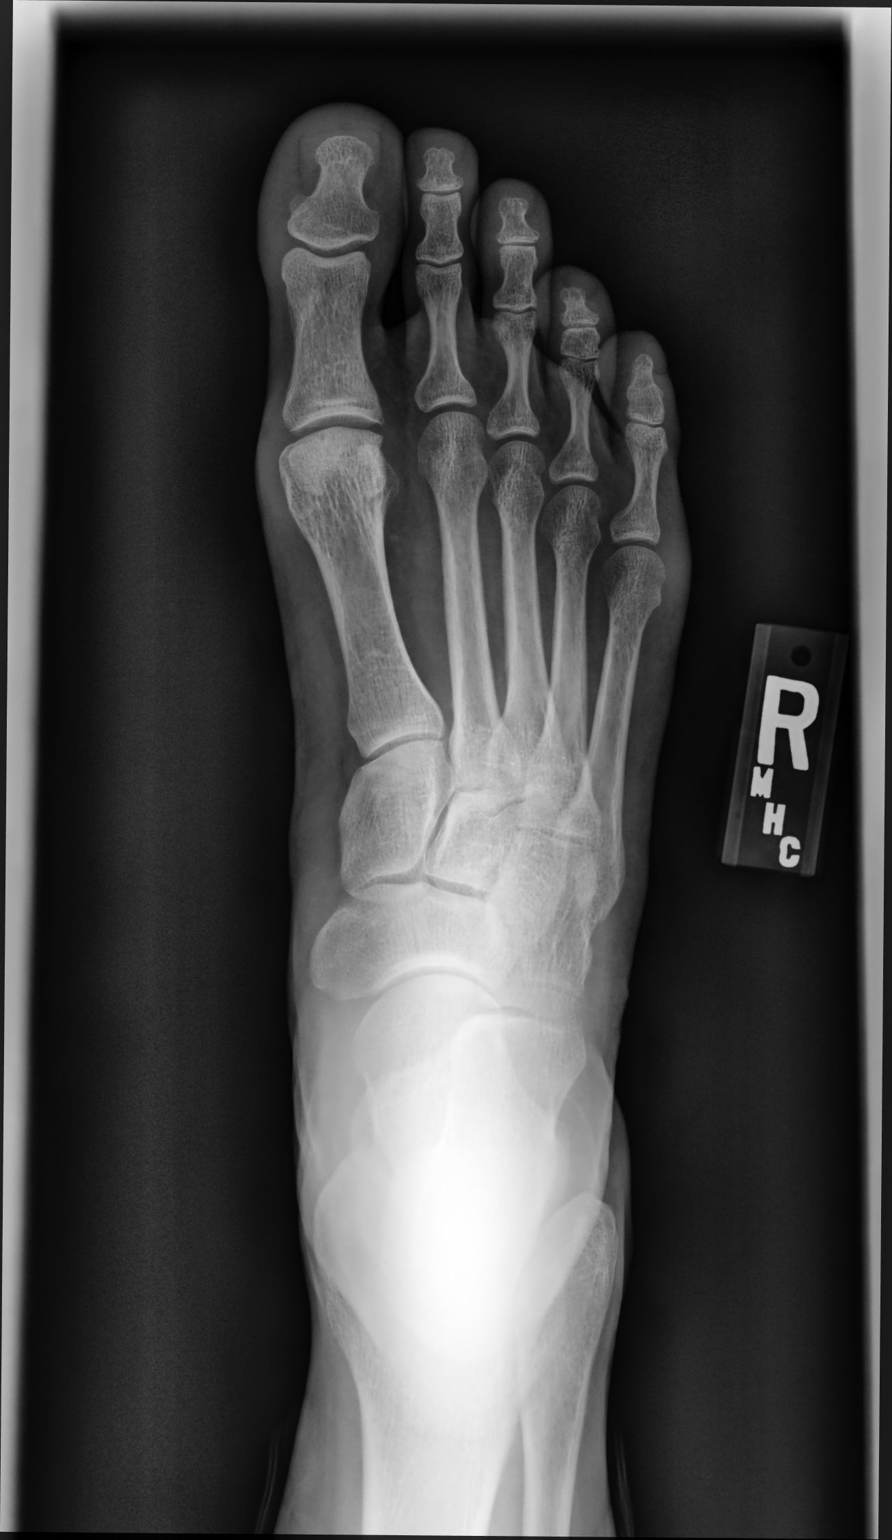

[foot mlo]
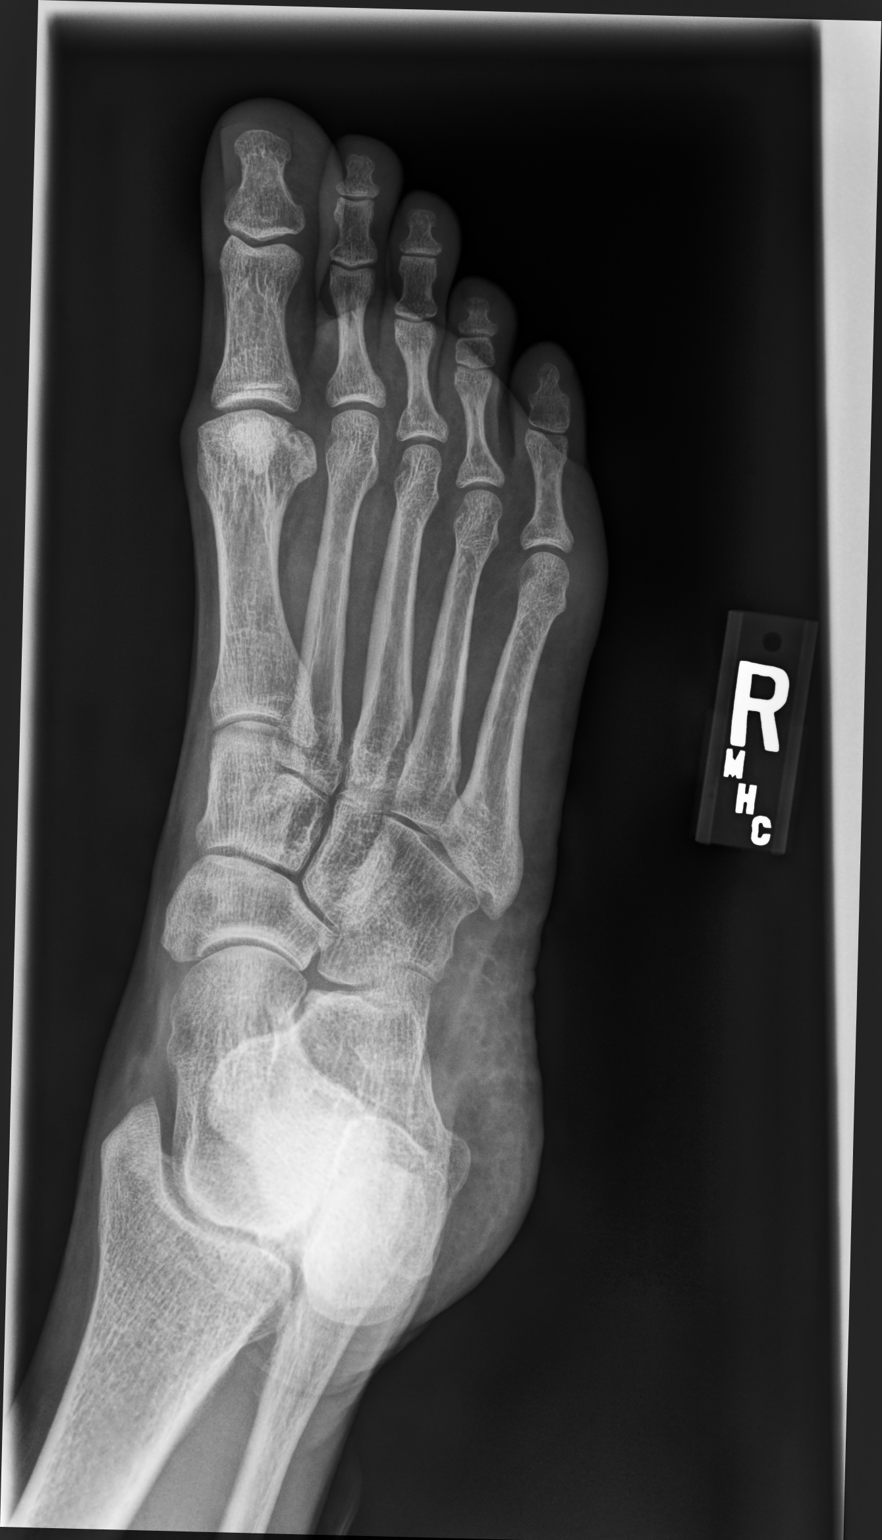

[foot lat]
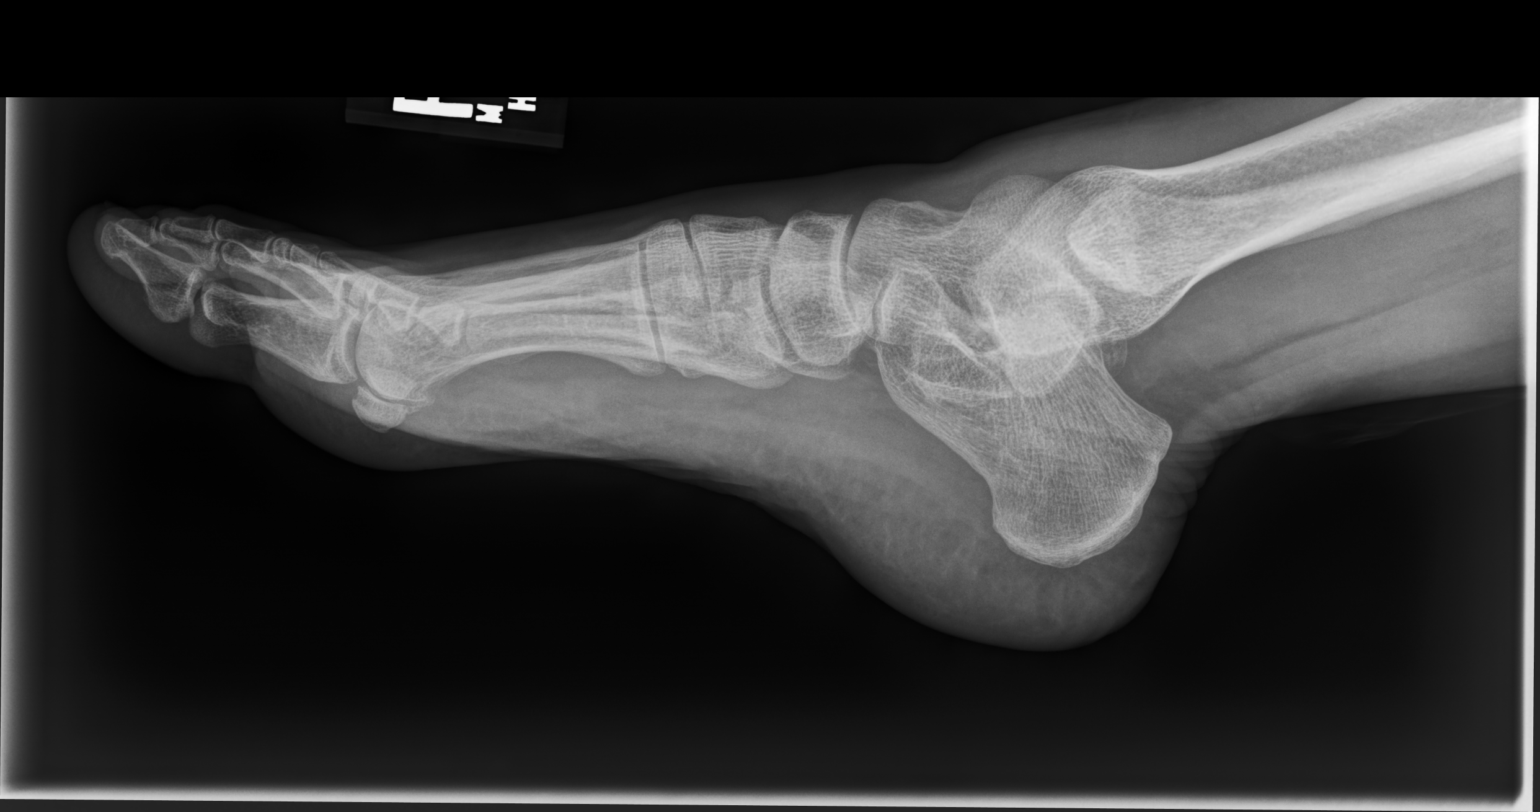

[3 of 3 positions shown; findings below may reference images not displayed]

FINDINGS: There is no evidence of fracture or dislocation. There is no
evidence of arthropathy or other focal bone abnormality. Soft
tissues are unremarkable.
IMPRESSION: No acute fracture or malalignment.

## 2023-06-23 ENCOUNTER — Encounter: Payer: Self-pay | Admitting: Internal Medicine

## 2023-06-25 DIAGNOSIS — S92515A Nondisplaced fracture of proximal phalanx of left lesser toe(s), initial encounter for closed fracture: Secondary | ICD-10-CM | POA: Diagnosis not present

## 2023-08-28 ENCOUNTER — Other Ambulatory Visit: Payer: Self-pay | Admitting: Internal Medicine

## 2023-08-31 NOTE — Telephone Encounter (Signed)
LMTCB

## 2023-09-01 NOTE — Telephone Encounter (Signed)
Vm left informing pt to CB and get scheduled

## 2023-09-02 NOTE — Telephone Encounter (Signed)
Lvm for pt to give office a call back to schedule an appointment

## 2023-09-09 MED ORDER — SERTRALINE HCL 50 MG PO TABS: 50.0000 mg | ORAL_TABLET | Freq: Every day | ORAL | 0 refills | Status: DC

## 2023-09-09 MED ORDER — PANTOPRAZOLE SODIUM 40 MG PO TBEC: 40.0000 mg | DELAYED_RELEASE_TABLET | Freq: Every day | ORAL | 0 refills | Status: DC

## 2023-09-09 MED ORDER — METOPROLOL SUCCINATE ER 50 MG PO TB24: 50.0000 mg | ORAL_TABLET | Freq: Every day | ORAL | 0 refills | Status: DC

## 2023-10-04 ENCOUNTER — Ambulatory Visit: Payer: BC Managed Care – PPO | Admitting: Internal Medicine

## 2023-10-10 ENCOUNTER — Other Ambulatory Visit: Payer: Self-pay | Admitting: Internal Medicine

## 2023-10-11 ENCOUNTER — Other Ambulatory Visit: Payer: Self-pay | Admitting: Internal Medicine

## 2023-11-07 ENCOUNTER — Encounter: Payer: Self-pay | Admitting: Internal Medicine

## 2023-11-07 ENCOUNTER — Ambulatory Visit: Admitting: Internal Medicine

## 2023-11-07 VITALS — BP 136/88 | HR 100 | Ht 67.0 in | Wt 183.0 lb

## 2023-11-07 DIAGNOSIS — E785 Hyperlipidemia, unspecified: Secondary | ICD-10-CM

## 2023-11-07 DIAGNOSIS — I10 Essential (primary) hypertension: Secondary | ICD-10-CM | POA: Diagnosis not present

## 2023-11-07 DIAGNOSIS — Z79899 Other long term (current) drug therapy: Secondary | ICD-10-CM | POA: Diagnosis not present

## 2023-11-07 DIAGNOSIS — Z1231 Encounter for screening mammogram for malignant neoplasm of breast: Secondary | ICD-10-CM

## 2023-11-07 DIAGNOSIS — E1169 Type 2 diabetes mellitus with other specified complication: Secondary | ICD-10-CM | POA: Diagnosis not present

## 2023-11-07 DIAGNOSIS — K222 Esophageal obstruction: Secondary | ICD-10-CM

## 2023-11-07 DIAGNOSIS — E1159 Type 2 diabetes mellitus with other circulatory complications: Secondary | ICD-10-CM

## 2023-11-07 DIAGNOSIS — E669 Obesity, unspecified: Secondary | ICD-10-CM | POA: Diagnosis not present

## 2023-11-07 DIAGNOSIS — F411 Generalized anxiety disorder: Secondary | ICD-10-CM

## 2023-11-07 DIAGNOSIS — F419 Anxiety disorder, unspecified: Secondary | ICD-10-CM

## 2023-11-07 DIAGNOSIS — I152 Hypertension secondary to endocrine disorders: Secondary | ICD-10-CM

## 2023-11-07 DIAGNOSIS — F32A Depression, unspecified: Secondary | ICD-10-CM

## 2023-11-07 DIAGNOSIS — F410 Panic disorder [episodic paroxysmal anxiety] without agoraphobia: Secondary | ICD-10-CM

## 2023-11-07 DIAGNOSIS — N1831 Chronic kidney disease, stage 3a: Secondary | ICD-10-CM

## 2023-11-07 LAB — POCT GLYCOSYLATED HEMOGLOBIN (HGB A1C): Hemoglobin A1C: 5.8 % — AB (ref 4.0–5.6)

## 2023-11-07 MED ORDER — SEMAGLUTIDE (1 MG/DOSE) 4 MG/3ML ~~LOC~~ SOPN: 1.0000 mg | PEN_INJECTOR | SUBCUTANEOUS | 2 refills | Status: DC

## 2023-11-07 MED ORDER — METOPROLOL SUCCINATE ER 50 MG PO TB24: 50.0000 mg | ORAL_TABLET | Freq: Every day | ORAL | 1 refills | Status: DC

## 2023-11-07 MED ORDER — CYANOCOBALAMIN 1000 MCG/ML IJ SOLN: INTRAMUSCULAR | 1 refills | Status: AC

## 2023-11-07 MED ORDER — ATORVASTATIN CALCIUM 10 MG PO TABS: 10.0000 mg | ORAL_TABLET | Freq: Every day | ORAL | 1 refills | Status: DC

## 2023-11-07 MED ORDER — PANTOPRAZOLE SODIUM 40 MG PO TBEC: 40.0000 mg | DELAYED_RELEASE_TABLET | Freq: Two times a day (BID) | ORAL | 1 refills | Status: DC

## 2023-11-07 MED ORDER — CYCLOBENZAPRINE HCL 5 MG PO TABS: 5.0000 mg | ORAL_TABLET | Freq: Three times a day (TID) | ORAL | 0 refills | Status: DC | PRN

## 2023-11-07 MED ORDER — METFORMIN HCL ER 750 MG PO TB24: ORAL_TABLET | ORAL | 1 refills | Status: DC

## 2023-11-07 MED ORDER — PAROXETINE HCL ER 25 MG PO TB24: 25.0000 mg | ORAL_TABLET | Freq: Every day | ORAL | 2 refills | Status: DC

## 2023-11-07 MED ORDER — ONDANSETRON 4 MG PO TBDP: ORAL_TABLET | ORAL | 2 refills | Status: DC

## 2023-11-07 MED ORDER — TRAZODONE HCL 50 MG PO TABS: 25.0000 mg | ORAL_TABLET | Freq: Every evening | ORAL | 2 refills | Status: DC | PRN

## 2023-11-07 NOTE — Patient Instructions (Signed)
 Welcome Back!  I have changed your zolofT  (sertraline ) to Paxil  to provide more calmness  YOUR OZEMPIC  PEN CAN BE MANIPULATED INTO DELIVERING LOWER DOSES BY "COUNTING CLICKS"   THE 4 MG PEN  (TEAL( THAT DELIVERS THE 1 MG WEEKLY DOSE:  19 CLICKS    0.25 MG DOSE 37 CLICKS    0.50 MG DOSE

## 2023-11-07 NOTE — Progress Notes (Unsigned)
 Subjective:  Patient ID: Mary Bryant, female    DOB: Oct 26, 1965  Age: 58 y.o. MRN: [id number]  CC: The primary encounter diagnosis was Encounter for screening mammogram for malignant neoplasm of breast. Diagnoses of Obesity, diabetes, and hypertension syndrome (HCC), Long-term use of high-risk medication, Hyperlipidemia, unspecified hyperlipidemia type, Anxiety, and Anxiety and depression were also pertinent to this visit.   HPI Mary Bryant presents for  Chief Complaint  Patient presents with   Medical Management of Chronic Issues   Last seen over one year ago .  Has been struggling emotionally and financially   Esophgeal      Outpatient Medications Prior to Visit  Medication Sig Dispense Refill   acetaminophen  (TYLENOL ) 500 MG tablet Take 1,000 mg by mouth every 6 (six) hours as needed for mild pain.     acyclovir  (ZOVIRAX ) 400 MG tablet Take 1 tablet (400 mg total) by mouth 5 (five) times daily. For one week  As needed for blisters 35 tablet 1   ALPRAZolam  (XANAX ) 0.25 MG tablet Take 1 tablet (0.25 mg total) by mouth daily as needed for anxiety. 30 tablet 5   atorvastatin  (LIPITOR) 10 MG tablet TAKE 1 TABLET BY MOUTH EVERY DAY 90 tablet 0   betamethasone  valerate (VALISONE ) 0.1 % cream Apply topically 2 (two) times daily. 30 g 0   celecoxib  (CELEBREX ) 200 MG capsule Take 1 capsule (200 mg total) by mouth daily. As needed for joint pain 30 capsule 2   cyanocobalamin  (VITAMIN B12) 1000 MCG/ML injection INJECT 1 ML (1,000 MCG TOTAL) INTO THE MUSCLE EVERY 30 DAYS. 10 mL 1   cyclobenzaprine  (FLEXERIL ) 5 MG tablet Take 1 tablet (5 mg total) by mouth 3 (three) times daily as needed for muscle spasms. 15 tablet 0   metFORMIN  (GLUCOPHAGE -XR) 750 MG 24 hr tablet TAKE 1 TABLET BY MOUTH EVERY DAY WITH BREAKFAST 90 tablet 0   metoprolol  succinate (TOPROL -XL) 50 MG 24 hr tablet TAKE 1 TABLET BY MOUTH DAILY. TAKE WITH OR IMMEDIATELY FOLLOWING A MEAL. 30 tablet 0   nitroGLYCERIN   (NITROSTAT ) 0.4 MG SL tablet Place 1 tablet (0.4 mg total) under the tongue every 5 (five) minutes as needed for chest pain. 30 tablet 0   ondansetron  (ZOFRAN -ODT) 4 MG disintegrating tablet TAKE 1 TABLET BY MOUTH EVERY 8 HOURS AS NEEDED FOR NAUSEA AND VOMITING 30 tablet 2   pantoprazole  (PROTONIX ) 40 MG tablet TAKE 1 TABLET BY MOUTH EVERY DAY 30 tablet 0   sertraline  (ZOLOFT ) 50 MG tablet Take 1 tablet (50 mg total) by mouth daily. 30 tablet 0   silver  sulfADIAZINE  (SILVADENE ) 1 % cream Apply 1 application topically daily. 400 g 1   Syringe/Needle, Disp, (SYRINGE 3CC/25GX1") 25G X 1" 3 ML MISC Use for b12 injections 50 each 0   traZODone  (DESYREL ) 50 MG tablet TAKE 1/2 TO 1 TABLET BY MOUTH AT BEDTIME AS NEEDED FOR SLEEP 90 tablet 2   tirzepatide  (MOUNJARO ) 5 MG/0.5ML Pen Inject 5 mg into the skin once a week. (Patient not taking: Reported on 11/07/2023) 6 mL 0   Facility-Administered Medications Prior to Visit  Medication Dose Route Frequency Provider Last Rate Last Admin   gadopentetate dimeglumine  (MAGNEVIST ) injection 20 mL  20 mL Intravenous Once PRN Phebe Brasil, MD        Review of Systems;  Patient denies headache, fevers, malaise, unintentional weight loss, skin rash, eye pain, sinus congestion and sinus pain, sore throat, dysphagia,  hemoptysis , cough, dyspnea, wheezing, chest pain, palpitations, orthopnea,  edema, abdominal pain, nausea, melena, diarrhea, constipation, flank pain, dysuria, hematuria, urinary  Frequency, nocturia, numbness, tingling, seizures,  Focal weakness, Loss of consciousness,  Tremor, insomnia, depression, anxiety, and suicidal ideation.      Objective:  BP 136/88   Pulse 100   Ht 5\' 7"  (1.702 m)   Wt 183 lb (83 kg)   SpO2 98%   BMI 28.66 kg/m   BP Readings from Last 3 Encounters:  11/07/23 136/88  08/03/22 116/72  05/23/22 116/81    Wt Readings from Last 3 Encounters:  11/07/23 183 lb (83 kg)  10/05/22 172 lb 9.6 oz (78.3 kg)  08/03/22 172 lb 9.6  oz (78.3 kg)    Physical Exam  Lab Results  Component Value Date   HGBA1C 5.7 08/03/2022   HGBA1C 5.7 11/30/2021   HGBA1C 6.0 07/28/2021    Lab Results  Component Value Date   CREATININE 0.96 08/03/2022   CREATININE 1.01 (H) 05/23/2022   CREATININE 1.04 11/30/2021    Lab Results  Component Value Date   WBC 6.9 08/03/2022   HGB 13.6 08/03/2022   HCT 40.5 08/03/2022   PLT 319.0 08/03/2022   GLUCOSE 84 08/03/2022   CHOL 197 08/03/2022   TRIG 156.0 (H) 08/03/2022   HDL 61.40 08/03/2022   LDLDIRECT 117.0 08/03/2022   LDLCALC 104 (H) 08/03/2022   ALT 15 08/03/2022   AST 16 08/03/2022   NA 140 08/03/2022   K 4.2 08/03/2022   CL 103 08/03/2022   CREATININE 0.96 08/03/2022   BUN 10 08/03/2022   CO2 27 08/03/2022   TSH 1.78 08/03/2022   HGBA1C 5.7 08/03/2022   MICROALBUR 1.5 08/03/2022    DG Chest 2 View Result Date: 05/23/2022 CLINICAL DATA:  Acute chest pain. EXAM: CHEST - 2 VIEW COMPARISON:  12/21/2016 and prior studies FINDINGS: The cardiomediastinal silhouette is unremarkable. There is no evidence of focal airspace disease, pulmonary edema, suspicious pulmonary nodule/mass, pleural effusion, or pneumothorax. No acute bony abnormalities are identified. IMPRESSION: No active cardiopulmonary disease. Electronically Signed   By: Sundra Engel M.D.   On: 05/23/2022 16:00    Assessment & Plan:  .Encounter for screening mammogram for malignant neoplasm of breast  Obesity, diabetes, and hypertension syndrome (HCC)  Long-term use of high-risk medication  Hyperlipidemia, unspecified hyperlipidemia type  Anxiety  Anxiety and depression     I spent 34 minutes on the day of this face to face encounter reviewing patient's  most recent visit with cardiology,  nephrology,  and neurology,  prior relevant surgical and non surgical procedures, recent  labs and imaging studies, counseling on weight management,  reviewing the assessment and plan with patient, and post visit  ordering and reviewing of  diagnostics and therapeutics with patient  .   Follow-up: No follow-ups on file.   Thersia Flax, MD

## 2023-11-07 NOTE — Assessment & Plan Note (Signed)
 Previously Managed with metformin  and Ozempic  with wt ossof 40 lbs  resume ozempic  , atorvastatin  and metfomin.   .    Lab Results  Component Value Date   HGBA1C 5.8 (A) 11/07/2023   Lab Results  Component Value Date   CHOL 197 08/03/2022   HDL 61.40 08/03/2022   LDLCALC 104 (H) 08/03/2022   LDLDIRECT 117.0 08/03/2022   TRIG 156.0 (H) 08/03/2022   CHOLHDL 3 08/03/2022

## 2023-11-08 ENCOUNTER — Encounter: Payer: Self-pay | Admitting: Internal Medicine

## 2023-11-08 LAB — TSH: TSH: 1.3 u[IU]/mL (ref 0.35–5.50)

## 2023-11-08 LAB — COMPREHENSIVE METABOLIC PANEL WITH GFR
ALT: 19 U/L (ref 0–35)
AST: 26 U/L (ref 0–37)
Albumin: 4.4 g/dL (ref 3.5–5.2)
Alkaline Phosphatase: 73 U/L (ref 39–117)
BUN: 11 mg/dL (ref 6–23)
CO2: 26 meq/L (ref 19–32)
Calcium: 9.8 mg/dL (ref 8.4–10.5)
Chloride: 101 meq/L (ref 96–112)
Creatinine, Ser: 1.19 mg/dL (ref 0.40–1.20)
GFR: 51.18 mL/min — ABNORMAL LOW (ref >60.00)
Glucose, Bld: 93 mg/dL (ref 70–99)
Potassium: 3.8 meq/L (ref 3.5–5.1)
Sodium: 138 meq/L (ref 135–145)
Total Bilirubin: 0.4 mg/dL (ref 0.2–1.2)
Total Protein: 8.2 g/dL (ref 6.0–8.3)

## 2023-11-08 LAB — LIPID PANEL
Cholesterol: 158 mg/dL (ref 0–200)
HDL: 55.4 mg/dL (ref >39.00)
LDL Cholesterol: 80 mg/dL (ref 0–99)
NonHDL: 102.86
Total CHOL/HDL Ratio: 3
Triglycerides: 114 mg/dL (ref 0.0–149.0)
VLDL: 22.8 mg/dL (ref 0.0–40.0)

## 2023-11-08 LAB — CBC WITH DIFFERENTIAL/PLATELET
Basophils Absolute: 0.1 10*3/uL (ref 0.0–0.1)
Basophils Relative: 0.5 % (ref 0.0–3.0)
Eosinophils Absolute: 0.2 10*3/uL (ref 0.0–0.7)
Eosinophils Relative: 2.5 % (ref 0.0–5.0)
HCT: 39.9 % (ref 36.0–46.0)
Hemoglobin: 13 g/dL (ref 12.0–15.0)
Lymphocytes Relative: 19.4 % (ref 12.0–46.0)
Lymphs Abs: 1.9 10*3/uL (ref 0.7–4.0)
MCHC: 32.7 g/dL (ref 30.0–36.0)
MCV: 84.7 fl (ref 78.0–100.0)
Monocytes Absolute: 0.8 10*3/uL (ref 0.1–1.0)
Monocytes Relative: 7.8 % (ref 3.0–12.0)
Neutro Abs: 6.8 10*3/uL (ref 1.4–7.7)
Neutrophils Relative %: 69.8 % (ref 43.0–77.0)
Platelets: 384 10*3/uL (ref 150.0–400.0)
RBC: 4.71 Mil/uL (ref 3.87–5.11)
RDW: 14.2 % (ref 11.5–15.5)
WBC: 9.8 10*3/uL (ref 4.0–10.5)

## 2023-11-08 LAB — MICROALBUMIN / CREATININE URINE RATIO
Creatinine,U: 365.4 mg/dL
Microalb Creat Ratio: 6 mg/g (ref 0.0–30.0)
Microalb, Ur: 2.2 mg/dL — ABNORMAL HIGH (ref 0.0–1.9)

## 2023-11-08 LAB — LDL CHOLESTEROL, DIRECT: Direct LDL: 84 mg/dL

## 2023-11-08 NOTE — Assessment & Plan Note (Signed)
 Avoiding use of alprazolam  . Changing zoloft  to paxil  cr

## 2023-11-08 NOTE — Assessment & Plan Note (Signed)
 Reviiewed last EGD.  She is taking PPI symptoms have returned.  DG esophagus recommended.

## 2023-11-08 NOTE — Assessment & Plan Note (Addendum)
 Previously Well controlled on current regimen of metoprolol .  .will resume.   Renal function  assessment is due

## 2023-11-08 NOTE — Addendum Note (Signed)
 Addended by: Thersia Flax on: 11/08/2023 08:49 PM   Modules accepted: Orders

## 2023-11-24 ENCOUNTER — Ambulatory Visit: Admitting: Internal Medicine

## 2023-11-29 ENCOUNTER — Other Ambulatory Visit: Payer: Self-pay | Admitting: Internal Medicine

## 2024-02-04 ENCOUNTER — Other Ambulatory Visit: Payer: Self-pay | Admitting: Internal Medicine

## 2024-03-14 ENCOUNTER — Encounter: Payer: Self-pay | Admitting: Internal Medicine

## 2024-03-14 ENCOUNTER — Telehealth: Payer: Self-pay | Admitting: Internal Medicine

## 2024-03-14 NOTE — Telephone Encounter (Signed)
 I have placed in red folder for completion. Does pt need to schedule an appointment for completion. Pt is needing the form completed because she will be caring for her mother.

## 2024-03-14 NOTE — Telephone Encounter (Signed)
 Pt's father has dropped off some Family Leave paperwork that needs to be completed ASAP, stating it is so pt does not lose their job while dealing with family illness/surgery. Paperwork is in Dr [employer] folder up front.

## 2024-03-15 ENCOUNTER — Encounter: Payer: Self-pay | Admitting: Internal Medicine

## 2024-03-15 DIAGNOSIS — Z0279 Encounter for issue of other medical certificate: Secondary | ICD-10-CM

## 2024-03-15 NOTE — Telephone Encounter (Signed)
 Pt is aware and gave a verbal understanding.

## 2024-03-16 NOTE — Telephone Encounter (Signed)
 Pt's father picked up paperwork this morning. -kh

## 2024-03-20 NOTE — Telephone Encounter (Signed)
 I have placed in red folder

## 2024-04-25 ENCOUNTER — Telehealth: Payer: Self-pay

## 2024-04-25 NOTE — Telephone Encounter (Signed)
 I left a voicemail for patient asking her to please call us  back to reschedule her 05/08/2024 appointment with Dr. Verneita Kettering, as she will be out of the office that day.  I also sent a message to patient via MyChart.  E2C2 - when patient calls back, please assist her with rescheduling this appointment.

## 2024-05-08 ENCOUNTER — Ambulatory Visit: Admitting: Internal Medicine

## 2024-06-25 ENCOUNTER — Ambulatory Visit: Admitting: Internal Medicine

## 2024-07-04 ENCOUNTER — Ambulatory Visit (HOSPITAL_COMMUNITY): Payer: Self-pay

## 2024-07-04 ENCOUNTER — Ambulatory Visit
Admission: RE | Admit: 2024-07-04 | Discharge: 2024-07-04 | Disposition: A | Discharge status: Home / Self Care | Attending: Emergency Medicine

## 2024-07-04 ENCOUNTER — Ambulatory Visit (INDEPENDENT_AMBULATORY_CARE_PROVIDER_SITE_OTHER)

## 2024-07-04 VITALS — BP 141/89 | HR 105 | Temp 98.4°F | Resp 18

## 2024-07-04 DIAGNOSIS — J189 Pneumonia, unspecified organism: Secondary | ICD-10-CM | POA: Diagnosis not present

## 2024-07-04 DIAGNOSIS — R058 Other specified cough: Secondary | ICD-10-CM

## 2024-07-04 MED ORDER — AZITHROMYCIN 250 MG PO TABS: 250.0000 mg | ORAL_TABLET | Freq: Every day | ORAL | 0 refills | Status: DC

## 2024-07-04 MED ORDER — AMOXICILLIN-POT CLAVULANATE 875-125 MG PO TABS: 1.0000 | ORAL_TABLET | Freq: Two times a day (BID) | ORAL | 0 refills | Status: DC

## 2024-07-04 NOTE — Discharge Instructions (Addendum)
Take the Augmentin and Zithromax as directed.  Follow up with your primary care provider tomorrow.  Go to the emergency department if you have worsening symptoms.

## 2024-07-04 NOTE — ED Provider Notes (Signed)
 Mary Bryant    CSN: [id number] Arrival date & time: 07/04/24  1420      History   Chief Complaint Chief Complaint  Patient presents with   Back Pain    Hard to breathe coughing  sever head ache - Entered by patient    HPI Mary Bryant is a 58 y.o. female.  Patient presents with 1 week history of fever, headache, fatigue, productive cough, back pain, nausea.  No chest pain or shortness of breath.  No vomiting or diarrhea.  She is concerned for possible pneumonia as she reports history of pneumonia with similar symptoms 2 years ago.  No OTC medication taken today.  The history is provided by the patient and medical records.    Past Medical History:  Diagnosis Date   Dental bridge present    No bridge.  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   PONV (postoperative nausea and vomiting)    rhinitis allergic    Trigeminal neuralgia    Wears contact lenses    Wears hearing aid    bilateral    Patient Active Problem List   Diagnosis Date Noted   Caregiver role strain 10/06/2022   Positive self-administered antigen test for COVID-19 02/10/2022   Suspected COVID-19 virus infection 08/30/2020   Stricture and stenosis of esophagus    Polyp of sigmoid colon    Chronic fatigue 01/03/2018   B12 deficiency 01/03/2018   Obesity, diabetes, and hypertension syndrome (HCC) 10/01/2017   Chronic migraine 05/16/2017   Hospital discharge follow-up 05/30/2016   Encounter for preventive health examination 05/10/2015   Menopausal hot flushes 05/10/2015   S/P abdominal hysterectomy 05/08/2015   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/2015   Trigeminal neuralgia of  left side of face 03/22/2014   Generalized anxiety disorder with panic attacks 10/25/2013   Hiatal hernia with GERD and esophagitis 10/25/2013   Anxiety 10/25/2013   History of breast lump/mass excision 08/08/2013   Vitamin D  deficiency 06/10/2013   Screening for breast cancer 06/08/2013   CAD in native artery 06/08/2013   H/O left bundle branch block 06/08/2013   Chest pain 04/04/2013   Essential hypertension    Insomnia     Past Surgical History:  Procedure Laterality Date   ABDOMINAL HYSTERECTOMY  07/19/1998   no history of ca    CARDIAC SURGERY     CHOLECYSTECTOMY  07/19/2006   COLONOSCOPY WITH PROPOFOL  N/A 05/11/2019   Procedure: COLONOSCOPY WITH PROPOFOL ;  Surgeon: Jinny Carmine, MD;  Location: [facility];  Service: Endoscopy;  Laterality: N/A;   ESOPHAGEAL DILATION  05/11/2019   Procedure: ESOPHAGEAL DILATION;  Surgeon: Jinny Carmine, MD;  Location: [facility];  Service: Endoscopy;;   ESOPHAGOGASTRODUODENOSCOPY (EGD) WITH PROPOFOL  N/A 05/12/2016   Procedure: ESOPHAGOGASTRODUODENOSCOPY (EGD) WITH PROPOFOL ;  Surgeon: Ruel Kung, MD;  Location: [facility];  Service: Endoscopy;  Laterality: N/A;   ESOPHAGOGASTRODUODENOSCOPY (EGD) WITH PROPOFOL  N/A 05/11/2019   Procedure: ESOPHAGOGASTRODUODENOSCOPY (EGD) WITH PROPOFOL ;  Surgeon: Jinny Carmine, MD;  Location: [facility];  Service: Endoscopy;  Laterality: N/A;  Diabetic - oral meds   POLYPECTOMY  05/11/2019   Procedure: POLYPECTOMY;  Surgeon: Jinny Carmine, MD;  Location: [facility];  Service: Endoscopy;;    OB History     Gravida  3   Para  2   Term      Preterm      AB  1   Living  2      SAB  1   IAB      Ectopic      Multiple      Live Births           Obstetric Comments  1st Menstrual Cycle:  15 1st Pregnancy:  24          Home Medications    Prior to Admission medications  Medication Sig Start Date End Date Taking? Authorizing Provider   amoxicillin -clavulanate (AUGMENTIN ) 875-125 MG tablet Take 1 tablet by mouth every 12 (twelve) hours. 07/04/24  Yes Corlis Burnard DEL, NP  azithromycin  (ZITHROMAX ) 250 MG tablet Take 1 tablet (250 mg total) by mouth daily. Take first 2 tablets together, then 1 every day until finished. 07/04/24  Yes Corlis Burnard DEL, NP  acetaminophen  (TYLENOL ) 500 MG tablet Take 1,000 mg by mouth every 6 (six) hours as needed for mild pain.    [provider]  acyclovir  (ZOVIRAX ) 400 MG tablet Take 1 tablet (400 mg total) by mouth 5 (five) times daily. For one week  As needed for blisters 08/03/22   Marylynn Verneita CROME, MD  ALPRAZolam  (XANAX ) 0.25 MG tablet Take 1 tablet (0.25 mg total) by mouth daily as needed for anxiety. 04/24/21   Marylynn Verneita CROME, MD  atorvastatin  (LIPITOR) 10 MG tablet Take 1 tablet (10 mg total) by mouth daily. 11/07/23   Marylynn Verneita CROME, MD  betamethasone  valerate (VALISONE ) 0.1 % cream Apply topically 2 (two) times daily. 08/03/22   Marylynn Verneita CROME, MD  celecoxib  (CELEBREX ) 200 MG capsule Take 1 capsule (200 mg total) by mouth daily. As needed for joint pain 08/03/22   Marylynn Verneita CROME, MD  cyanocobalamin  (VITAMIN B12) 1000 MCG/ML injection INJECT 1 ML (1,000 MCG TOTAL) INTO THE MUSCLE EVERY 30 DAYS. 11/07/23   Marylynn Verneita CROME, MD  cyclobenzaprine  (FLEXERIL ) 5 MG tablet Take 1 tablet (5 mg total) by mouth 3 (three) times daily as needed for muscle spasms. 11/07/23   Marylynn Verneita CROME, MD  metFORMIN  (GLUCOPHAGE -XR) 750 MG 24 hr tablet TAKE 1 TABLET BY MOUTH EVERY DAY WITH BREAKFAST 11/07/23   Marylynn Verneita CROME, MD  metoprolol  succinate (TOPROL -XL) 50 MG 24 hr tablet TAKE 1 TABLET BY MOUTH DAILY. TAKE WITH OR IMMEDIATELY FOLLOWING A MEAL. 02/06/24   Marylynn Verneita CROME, MD  nitroGLYCERIN  (NITROSTAT ) 0.4 MG SL tablet Place 1 tablet (0.4 mg total) under the tongue every 5 (five) minutes as needed for chest pain. 05/23/22 11/07/23  Antonette Angeline ORN, NP  ondansetron  (ZOFRAN -ODT) 4 MG disintegrating tablet TAKE 1 TABLET  BY MOUTH EVERY 8 HOURS AS NEEDED FOR NAUSEA AND VOMITING 11/07/23   Marylynn Verneita CROME, MD  pantoprazole  (PROTONIX ) 40 MG tablet TAKE 1 TABLET BY MOUTH TWICE A DAY 02/06/24   Marylynn Verneita CROME, MD  PARoxetine  (PAXIL -CR) 25 MG 24 hr tablet TAKE 1 TABLET (25 MG TOTAL) BY MOUTH DAILY. 11/29/23   Marylynn Verneita CROME, MD  Semaglutide , 1 MG/DOSE, 4 MG/3ML SOPN Inject 1 mg as directed once a week. 11/07/23   Marylynn Verneita CROME, MD  silver  sulfADIAZINE  (SILVADENE ) 1 % cream Apply 1 application topically daily. 03/05/21   Marylynn,  Verneita CROME, MD  Syringe/Needle, Disp, (SYRINGE 3CC/25GX1) 25G X 1 3 ML MISC Use for b12 injections 01/04/18   Marylynn Verneita CROME, MD  traZODone  (DESYREL ) 50 MG tablet Take 0.5-1 tablets (25-50 mg total) by mouth at bedtime as needed. for sleep 11/07/23   Marylynn Verneita CROME, MD    Family History Family History  Problem Relation Age of Onset   Cancer Father        Bladder,  in Hospice   Diabetes type II Father    COPD Father    Coronary artery disease Father    Heart disease Father 44   Diabetes Mother    Hyperlipidemia Mother    Hypertension Mother    Early death Paternal Grandmother    Heart disease Paternal Grandmother        CAD   Prostate cancer Paternal Apolinar Gavel' disease Daughter     Social History Social History[1]   Allergies   Promethazine, Benadryl [diphenhydramine hcl], and Phenothiazines   Review of Systems Review of Systems  Constitutional:  Positive for fatigue and fever. Negative for chills.  HENT:  Positive for congestion. Negative for ear pain and sore throat.   Respiratory:  Positive for cough. Negative for shortness of breath.   Cardiovascular:  Negative for chest pain and palpitations.  Gastrointestinal:  Positive for nausea. Negative for abdominal pain, diarrhea and vomiting.  Musculoskeletal:  Positive for back pain. Negative for gait problem.     Physical Exam Triage Vital Signs ED Triage Vitals [07/04/24 1436]  Encounter Vitals Group     BP  (!) 141/89     Girls Systolic BP Percentile      Girls Diastolic BP Percentile      Boys Systolic BP Percentile      Boys Diastolic BP Percentile      Pulse Rate (!) 105     Resp 18     Temp 98.4 F (36.9 C)     Temp src      SpO2 96 %     Weight      Height      Head Circumference      Peak Flow      Pain Score      Pain Loc      Pain Education      Exclude from Growth Chart    No data found.  Updated Vital Signs BP (!) 141/89   Pulse (!) 105   Temp 98.4 F (36.9 C)   Resp 18   SpO2 96%   Visual Acuity Right Eye Distance:   Left Eye Distance:   Bilateral Distance:    Right Eye Near:   Left Eye Near:    Bilateral Near:     Physical Exam Constitutional:      General: She is not in acute distress. HENT:     Right Ear: Tympanic membrane normal.     Left Ear: Tympanic membrane normal.     Nose: Congestion present.     Mouth/Throat:     Mouth: Mucous membranes are moist.     Pharynx: Oropharynx is clear.  Cardiovascular:     Rate and Rhythm: Normal rate and regular rhythm.     Heart sounds: Normal heart sounds.  Pulmonary:     Effort: Pulmonary effort is normal. No respiratory distress.     Breath sounds: Normal breath sounds.  Neurological:     Mental Status: She is alert.      UC Treatments /  Results  Labs (all labs ordered are listed, but only abnormal results are displayed) Labs Reviewed - No data to display  EKG   Radiology DG Chest 2 View Result Date: 07/04/2024 CLINICAL DATA:  Productive cough. EXAM: CHEST - 2 VIEW COMPARISON:  Chest radiograph dated 05/23/2022. FINDINGS: There is infiltrate in the right middle lobe consistent with pneumonia. The left lung is clear. No pleural effusion or pneumothorax. The cardiac silhouette is within normal limits. No acute osseous pathology. IMPRESSION: Right middle lobe pneumonia.  Follow-up to resolution recommended. Electronically Signed   By: Vanetta Chou M.D.   On: 07/04/2024 15:24     Procedures Procedures (including critical care time)  Medications Ordered in UC Medications - No data to display  Initial Impression / Assessment and Plan / UC Course  I have reviewed the triage vital signs and the nursing notes.  Pertinent labs & imaging results that were available during my care of the patient were reviewed by me and considered in my medical decision making (see chart for details).   Right middle lobe pneumonia, productive cough.  No acute respiratory distress.  O2 sat 96% on room air.  CXR shows right middle lobe pneumonia.  Treating with Augmentin  and Zithromax .  Instructed patient to follow-up with her PCP tomorrow.  Education provided on pneumonia.  ED precautions given.  She agrees to plan of care.   Final Clinical Impressions(s) / UC Diagnoses   Final diagnoses:  Pneumonia of right middle lobe due to infectious organism     Discharge Instructions      Take the Augmentin  and Zithromax  as directed.  Follow up with your primary care provider tomorrow.  Go to the emergency department if you have worsening symptoms.        ED Prescriptions     Medication Sig Dispense Auth. Provider   amoxicillin -clavulanate (AUGMENTIN ) 875-125 MG tablet Take 1 tablet by mouth every 12 (twelve) hours. 14 tablet Corlis Sor H, NP   azithromycin  (ZITHROMAX ) 250 MG tablet Take 1 tablet (250 mg total) by mouth daily. Take first 2 tablets together, then 1 every day until finished. 6 tablet Corlis Sor DEL, NP      PDMP not reviewed this encounter.     [1]  Social History Tobacco Use   Smoking status: Never   Smokeless tobacco: Never  Vaping Use   Vaping status: Never Used  Substance Use Topics   Alcohol use: No   Drug use: No     Corlis Sor DEL, NP 07/04/24 1543

## 2024-08-08 ENCOUNTER — Ambulatory Visit: Admitting: Internal Medicine

## 2024-08-22 ENCOUNTER — Ambulatory Visit: Admitting: Internal Medicine

## 2024-08-24 ENCOUNTER — Telehealth: Admitting: Internal Medicine

## 2024-08-24 ENCOUNTER — Encounter: Payer: Self-pay | Admitting: Internal Medicine

## 2024-08-24 ENCOUNTER — Other Ambulatory Visit: Payer: Self-pay | Admitting: Internal Medicine

## 2024-08-24 VITALS — Ht 67.0 in | Wt 180.0 lb

## 2024-08-24 DIAGNOSIS — E119 Type 2 diabetes mellitus without complications: Secondary | ICD-10-CM

## 2024-08-24 DIAGNOSIS — F410 Panic disorder [episodic paroxysmal anxiety] without agoraphobia: Secondary | ICD-10-CM

## 2024-08-24 DIAGNOSIS — F419 Anxiety disorder, unspecified: Secondary | ICD-10-CM

## 2024-08-24 DIAGNOSIS — R635 Abnormal weight gain: Secondary | ICD-10-CM

## 2024-08-24 DIAGNOSIS — F32A Depression, unspecified: Secondary | ICD-10-CM

## 2024-08-24 DIAGNOSIS — I10 Essential (primary) hypertension: Secondary | ICD-10-CM

## 2024-08-24 DIAGNOSIS — E538 Deficiency of other specified B group vitamins: Secondary | ICD-10-CM

## 2024-08-24 MED ORDER — ALPRAZOLAM 0.25 MG PO TABS: 0.2500 mg | ORAL_TABLET | Freq: Every day | ORAL | 5 refills | Status: AC | PRN

## 2024-08-24 MED ORDER — OZEMPIC (0.25 OR 0.5 MG/DOSE) 2 MG/3ML ~~LOC~~ SOPN: 0.2500 mg | PEN_INJECTOR | SUBCUTANEOUS | 3 refills | Status: AC

## 2024-08-24 MED ORDER — SERTRALINE HCL 50 MG PO TABS: 50.0000 mg | ORAL_TABLET | Freq: Every day | ORAL | 3 refills | Status: AC

## 2024-08-24 NOTE — Assessment & Plan Note (Signed)
 Previously Managed with metformin  and Ozempic  with wt ossof 40 lbs  resume ozempic  , atorvastatin  and metfomin.   . She has had a weight regain since stopping medication .     Lab Results  Component Value Date   HGBA1C 5.8 (A) 11/07/2023   Lab Results  Component Value Date   CHOL 158 11/07/2023   HDL 55.40 11/07/2023   LDLCALC 80 11/07/2023   LDLDIRECT 84.0 11/07/2023   TRIG 114.0 11/07/2023   CHOLHDL 3 11/07/2023

## 2024-08-24 NOTE — Progress Notes (Unsigned)
 Virtual Visit via Caregility   Note   This format is felt to be most appropriate for this patient at this time.  All issues noted in this document were discussed and addressed.  No physical exam was performed (except for noted visual exam findings with Video Visits).   I connected with  Mary Bryant on 08/24/24 at  5:00 PM EST by a video enabled telemedicine application and verified that I am speaking with the correct person using two identifiers. Location patient: home Location provider: work or home office Persons participating in the virtual visit: patient, provider  I discussed the limitations, risks, security and privacy concerns of performing an evaluation and management service by telephone and the availability of in person appointments. I also discussed with the patient that there may be a patient responsible charge related to this service. The patient expressed understanding and agreed to proceed.  Reason for visit: ***  HPI:  59 yr old female with a history of GAD last rteated with change from zoloft  to paxil  in April due to frequent panic attacks Virtual Visit via Caregility   Note   This format is felt to be most appropriate for this patient at this time.  All issues noted in this document were discussed and addressed.  No physical exam was performed (except for noted visual exam findings with Video Visits).   I connected withNAME@ on 08/24/24 at  5:00 PM EST by a video enabled telemedicine application or telephone and verified that I am speaking with the correct person using two identifiers. Location patient: home Location provider: work or home office Persons participating in the virtual visit: patient, provider  I discussed the limitations, risks, security and privacy concerns of performing an evaluation and management service by telephone and the availability of in person appointments. I also discussed with the patient that there may be a patient responsible charge related to  this service. The patient expressed understanding and agreed to proceed.  Interactive audio and video telecommunications were attempted between this provider and patient, however failed, due to patient having technical difficulties OR patient did not have access to video capability.  We continued and completed visit with audio only. ***  Reason for visit: ***  HPI: ***   ROS: See pertinent positives and negatives per HPI.  Past Medical History:  Diagnosis Date   Dental bridge present    No bridge.  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   PONV (postoperative nausea and vomiting)    rhinitis allergic    Trigeminal neuralgia    Wears contact lenses    Wears hearing aid    bilateral    Past Surgical History:  Procedure Laterality Date   ABDOMINAL HYSTERECTOMY  07/19/1998   no history of ca    CARDIAC SURGERY     CHOLECYSTECTOMY  07/19/2006   COLONOSCOPY WITH PROPOFOL  N/A 05/11/2019   Procedure: COLONOSCOPY WITH PROPOFOL ;  Surgeon: Jinny Carmine, MD;  Location: [facility];  Service: Endoscopy;  Laterality: N/A;   ESOPHAGEAL DILATION  05/11/2019   Procedure: ESOPHAGEAL DILATION;  Surgeon: Jinny Carmine, MD;  Location: [facility];  Service: Endoscopy;;  ESOPHAGOGASTRODUODENOSCOPY (EGD) WITH PROPOFOL  N/A 05/12/2016   Procedure: ESOPHAGOGASTRODUODENOSCOPY (EGD) WITH PROPOFOL ;  Surgeon: Ruel Kung, MD;  Location: [facility];  Service: Endoscopy;  Laterality: N/A;   ESOPHAGOGASTRODUODENOSCOPY (EGD) WITH PROPOFOL  N/A 05/11/2019   Procedure: ESOPHAGOGASTRODUODENOSCOPY (EGD) WITH PROPOFOL ;  Surgeon: Jinny Carmine, MD;  Location: [facility];  Service: Endoscopy;  Laterality:  N/A;  Diabetic - oral meds   POLYPECTOMY  05/11/2019   Procedure: POLYPECTOMY;  Surgeon: Jinny Carmine, MD;  Location: [facility];  Service: Endoscopy;;    Family History  Problem Relation Age of Onset   Cancer Father        Bladder,  in Hospice   Diabetes type II Father    COPD Father    Coronary artery disease Father    Heart disease Father 94   Diabetes Mother    Hyperlipidemia Mother    Hypertension Mother    Early death Paternal Grandmother    Heart disease Paternal Grandmother        CAD   Prostate cancer Paternal Apolinar Gavel' disease Daughter     SOCIAL HX: ***  Current Medications[1]  EXAM:  VITALS per patient if applicable:  GENERAL: alert, oriented, appears well and in no acute distress  HEENT: atraumatic, conjunttiva clear, no obvious abnormalities on inspection of external nose and ears  NECK: normal movements of the head and neck  LUNGS: on inspection no signs of respiratory distress, breathing rate appears normal, no obvious gross SOB, gasping or wheezing  CV: no obvious cyanosis  MS: moves all visible extremities without noticeable abnormality  PSYCH/NEURO: pleasant and cooperative, no obvious depression or anxiety, speech and thought processing grossly intact  ASSESSMENT AND PLAN: There are no diagnoses linked to this encounter.    I discussed the assessment and treatment plan with the patient. The patient was provided an opportunity to ask questions and all were answered. The patient agreed with the plan and demonstrated an understanding of the instructions.   The patient was advised to call back or seek an in-person evaluation if the symptoms worsen or if the condition fails to improve as anticipated.   I spent 30 minutes dedicated to the care of this patient on the date of this encounter to include pre-visit review of patient's medical history,  including recent ER visit, imaging studies and labs, face-to-face time with the  patient , and post visit ordering of testing and therapeutics.    Verneita LITTIE Kettering, MD    ROS: See pertinent positives and negatives per HPI.  Past Medical History:  Diagnosis Date   Dental bridge present    No bridge.  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   PONV (postoperative nausea and vomiting)    rhinitis allergic    Trigeminal neuralgia    Wears contact lenses    Wears hearing aid    bilateral    Past Surgical History:  Procedure Laterality Date   ABDOMINAL HYSTERECTOMY  07/19/1998   no history of ca    CARDIAC SURGERY     CHOLECYSTECTOMY  07/19/2006   COLONOSCOPY WITH PROPOFOL  N/A 05/11/2019   Procedure:  COLONOSCOPY WITH PROPOFOL ;  Surgeon: Jinny Carmine, MD;  Location: [facility];  Service: Endoscopy;  Laterality: N/A;   ESOPHAGEAL DILATION  05/11/2019   Procedure: ESOPHAGEAL DILATION;  Surgeon: Jinny Carmine, MD;  Location: [facility];  Service: Endoscopy;;   ESOPHAGOGASTRODUODENOSCOPY (EGD) WITH PROPOFOL  N/A 05/12/2016   Procedure: ESOPHAGOGASTRODUODENOSCOPY (EGD) WITH PROPOFOL ;  Surgeon: Ruel Kung, MD;  Location: [facility];  Service: Endoscopy;  Laterality: N/A;   ESOPHAGOGASTRODUODENOSCOPY (EGD) WITH PROPOFOL  N/A 05/11/2019   Procedure: ESOPHAGOGASTRODUODENOSCOPY (EGD) WITH PROPOFOL ;  Surgeon: Jinny Carmine, MD;  Location: [facility];  Service: Endoscopy;  Laterality: N/A;  Diabetic - oral meds   POLYPECTOMY  05/11/2019   Procedure: POLYPECTOMY;  Surgeon: Jinny Carmine, MD;  Location: [facility];  Service: Endoscopy;;    Family History  Problem Relation Age of Onset   Cancer Father        Bladder,  in Hospice   Diabetes type II Father     COPD Father    Coronary artery disease Father    Heart disease Father 74   Diabetes Mother    Hyperlipidemia Mother    Hypertension Mother    Early death Paternal Grandmother    Heart disease Paternal Grandmother        CAD   Prostate cancer Paternal Apolinar Gavel' disease Daughter     SOCIAL HX: ***  Current Medications[2]  EXAM:  VITALS per patient if applicable:  GENERAL: alert, oriented, appears well and in no acute distress  HEENT: atraumatic, conjunttiva clear, no obvious abnormalities on inspection of external nose and ears  NECK: normal movements of the head and neck  LUNGS: on inspection no signs of respiratory distress, breathing rate appears normal, no obvious gross SOB, gasping or wheezing  CV: no obvious cyanosis  MS: moves all visible extremities without noticeable abnormality  PSYCH/NEURO: pleasant and cooperative, no obvious depression or anxiety, speech and thought processing grossly intact  ASSESSMENT AND PLAN: There are no diagnoses linked to this encounter.    I discussed the assessment and treatment plan with the patient. The patient was provided an opportunity to ask questions and all were answered. The patient agreed with the plan and demonstrated an understanding of the instructions.   The patient was advised to call back or seek an in-person evaluation if the symptoms worsen or if the condition fails to improve as anticipated.   I spent 30 minutes dedicated to the care of this patient on the date of this encounter to include pre-visit review of patient's medical history,  including recent ER visit, imaging studies and labs, face-to-face time with the patient , and post visit ordering of testing and therapeutics.    Verneita LITTIE Kettering, MD     [1]  Current Outpatient Medications:    acetaminophen  (TYLENOL ) 500 MG tablet, Take 1,000 mg by mouth every 6 (six) hours as needed for mild pain., Disp: , Rfl:    acyclovir  (ZOVIRAX ) 400 MG  tablet, Take 1 tablet (400 mg total) by mouth 5 (five) times daily. For one week  As needed for blisters, Disp: 35 tablet, Rfl: 1   ALPRAZolam  (XANAX ) 0.25 MG tablet, Take 1 tablet (0.25 mg total) by mouth daily as needed for anxiety., Disp: 30 tablet, Rfl: 5   amoxicillin -clavulanate (AUGMENTIN ) 875-125 MG tablet, Take 1 tablet by mouth every 12 (twelve) hours., Disp: 14 tablet, Rfl: 0   atorvastatin  (LIPITOR) 10 MG tablet, Take 1 tablet (10 mg total) by mouth daily.,  Disp: 90 tablet, Rfl: 1   azithromycin  (ZITHROMAX ) 250 MG tablet, Take 1 tablet (250 mg total) by mouth daily. Take first 2 tablets together, then 1 every day until finished., Disp: 6 tablet, Rfl: 0   betamethasone  valerate (VALISONE ) 0.1 % cream, Apply topically 2 (two) times daily., Disp: 30 g, Rfl: 0   celecoxib  (CELEBREX ) 200 MG capsule, Take 1 capsule (200 mg total) by mouth daily. As needed for joint pain, Disp: 30 capsule, Rfl: 2   cyanocobalamin  (VITAMIN B12) 1000 MCG/ML injection, INJECT 1 ML (1,000 MCG TOTAL) INTO THE MUSCLE EVERY 30 DAYS., Disp: 10 mL, Rfl: 1   cyclobenzaprine  (FLEXERIL ) 5 MG tablet, Take 1 tablet (5 mg total) by mouth 3 (three) times daily as needed for muscle spasms., Disp: 15 tablet, Rfl: 0   metFORMIN  (GLUCOPHAGE -XR) 750 MG 24 hr tablet, TAKE 1 TABLET BY MOUTH EVERY DAY WITH BREAKFAST, Disp: 90 tablet, Rfl: 1   metoprolol  succinate (TOPROL -XL) 50 MG 24 hr tablet, TAKE 1 TABLET BY MOUTH DAILY. TAKE WITH OR IMMEDIATELY FOLLOWING A MEAL., Disp: 90 tablet, Rfl: 1   nitroGLYCERIN  (NITROSTAT ) 0.4 MG SL tablet, Place 1 tablet (0.4 mg total) under the tongue every 5 (five) minutes as needed for chest pain., Disp: 30 tablet, Rfl: 0   ondansetron  (ZOFRAN -ODT) 4 MG disintegrating tablet, TAKE 1 TABLET BY MOUTH EVERY 8 HOURS AS NEEDED FOR NAUSEA AND VOMITING, Disp: 30 tablet, Rfl: 2   pantoprazole  (PROTONIX ) 40 MG tablet, TAKE 1 TABLET BY MOUTH TWICE A DAY, Disp: 180 tablet, Rfl: 1   PARoxetine  (PAXIL -CR) 25 MG 24 hr  tablet, TAKE 1 TABLET (25 MG TOTAL) BY MOUTH DAILY., Disp: 90 tablet, Rfl: 0   Semaglutide , 1 MG/DOSE, 4 MG/3ML SOPN, Inject 1 mg as directed once a week., Disp: 9 mL, Rfl: 2   silver  sulfADIAZINE  (SILVADENE ) 1 % cream, Apply 1 application topically daily., Disp: 400 g, Rfl: 1   Syringe/Needle, Disp, (SYRINGE 3CC/25GX1) 25G X 1 3 ML MISC, Use for b12 injections, Disp: 50 each, Rfl: 0   traZODone  (DESYREL ) 50 MG tablet, Take 0.5-1 tablets (25-50 mg total) by mouth at bedtime as needed. for sleep, Disp: 90 tablet, Rfl: 2 No current facility-administered medications for this visit.  Facility-Administered Medications Ordered in Other Visits:    gadopentetate dimeglumine  (MAGNEVIST ) injection 20 mL, 20 mL, Intravenous, Once PRN, Onita Duos, MD [2]  Current Outpatient Medications:    acetaminophen  (TYLENOL ) 500 MG tablet, Take 1,000 mg by mouth every 6 (six) hours as needed for mild pain., Disp: , Rfl:    acyclovir  (ZOVIRAX ) 400 MG tablet, Take 1 tablet (400 mg total) by mouth 5 (five) times daily. For one week  As needed for blisters, Disp: 35 tablet, Rfl: 1   ALPRAZolam  (XANAX ) 0.25 MG tablet, Take 1 tablet (0.25 mg total) by mouth daily as needed for anxiety., Disp: 30 tablet, Rfl: 5   amoxicillin -clavulanate (AUGMENTIN ) 875-125 MG tablet, Take 1 tablet by mouth every 12 (twelve) hours., Disp: 14 tablet, Rfl: 0   atorvastatin  (LIPITOR) 10 MG tablet, Take 1 tablet (10 mg total) by mouth daily., Disp: 90 tablet, Rfl: 1   azithromycin  (ZITHROMAX ) 250 MG tablet, Take 1 tablet (250 mg total) by mouth daily. Take first 2 tablets together, then 1 every day until finished., Disp: 6 tablet, Rfl: 0   betamethasone  valerate (VALISONE ) 0.1 % cream, Apply topically 2 (two) times daily., Disp: 30 g, Rfl: 0   celecoxib  (CELEBREX ) 200 MG capsule, Take 1 capsule (200 mg total)  by mouth daily. As needed for joint pain, Disp: 30 capsule, Rfl: 2   cyanocobalamin  (VITAMIN B12) 1000 MCG/ML injection, INJECT 1 ML (1,000  MCG TOTAL) INTO THE MUSCLE EVERY 30 DAYS., Disp: 10 mL, Rfl: 1   cyclobenzaprine  (FLEXERIL ) 5 MG tablet, Take 1 tablet (5 mg total) by mouth 3 (three) times daily as needed for muscle spasms., Disp: 15 tablet, Rfl: 0   metFORMIN  (GLUCOPHAGE -XR) 750 MG 24 hr tablet, TAKE 1 TABLET BY MOUTH EVERY DAY WITH BREAKFAST, Disp: 90 tablet, Rfl: 1   metoprolol  succinate (TOPROL -XL) 50 MG 24 hr tablet, TAKE 1 TABLET BY MOUTH DAILY. TAKE WITH OR IMMEDIATELY FOLLOWING A MEAL., Disp: 90 tablet, Rfl: 1   nitroGLYCERIN  (NITROSTAT ) 0.4 MG SL tablet, Place 1 tablet (0.4 mg total) under the tongue every 5 (five) minutes as needed for chest pain., Disp: 30 tablet, Rfl: 0   ondansetron  (ZOFRAN -ODT) 4 MG disintegrating tablet, TAKE 1 TABLET BY MOUTH EVERY 8 HOURS AS NEEDED FOR NAUSEA AND VOMITING, Disp: 30 tablet, Rfl: 2   pantoprazole  (PROTONIX ) 40 MG tablet, TAKE 1 TABLET BY MOUTH TWICE A DAY, Disp: 180 tablet, Rfl: 1   PARoxetine  (PAXIL -CR) 25 MG 24 hr tablet, TAKE 1 TABLET (25 MG TOTAL) BY MOUTH DAILY., Disp: 90 tablet, Rfl: 0   Semaglutide , 1 MG/DOSE, 4 MG/3ML SOPN, Inject 1 mg as directed once a week., Disp: 9 mL, Rfl: 2   silver  sulfADIAZINE  (SILVADENE ) 1 % cream, Apply 1 application topically daily., Disp: 400 g, Rfl: 1   Syringe/Needle, Disp, (SYRINGE 3CC/25GX1) 25G X 1 3 ML MISC, Use for b12 injections, Disp: 50 each, Rfl: 0   traZODone  (DESYREL ) 50 MG tablet, Take 0.5-1 tablets (25-50 mg total) by mouth at bedtime as needed. for sleep, Disp: 90 tablet, Rfl: 2 No current facility-administered medications for this visit.  Facility-Administered Medications Ordered in Other Visits:    gadopentetate dimeglumine  (MAGNEVIST ) injection 20 mL, 20 mL, Intravenous, Once PRN, Onita Duos, MD
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
## 2024-09-14 ENCOUNTER — Ambulatory Visit: Admitting: Internal Medicine
